# Patient Record
Sex: Male | Born: 1965 | Race: White | Hispanic: No | Smoking: Former smoker
Health system: Southern US, Community
[De-identification: ages and names within clinical notes are randomized; demographics above are authoritative.]

## PROBLEM LIST (undated history)

## (undated) DIAGNOSIS — I10 Essential (primary) hypertension: Secondary | ICD-10-CM

## (undated) DIAGNOSIS — I639 Cerebral infarction, unspecified: Secondary | ICD-10-CM

## (undated) DIAGNOSIS — E785 Hyperlipidemia, unspecified: Secondary | ICD-10-CM

## (undated) HISTORY — PX: HERNIA REPAIR: SHX51

## (undated) HISTORY — DX: Hyperlipidemia, unspecified: E78.5

## (undated) HISTORY — DX: Essential (primary) hypertension: I10

## (undated) HISTORY — PX: SHOULDER SURGERY: SHX246

---

## 1999-02-12 ENCOUNTER — Emergency Department (HOSPITAL_COMMUNITY): Admission: EM | Admit: 1999-02-12 | Discharge: 1999-02-12 | Payer: Self-pay | Admitting: Emergency Medicine

## 1999-02-12 ENCOUNTER — Encounter: Payer: Self-pay | Admitting: Emergency Medicine

## 2002-02-12 ENCOUNTER — Emergency Department (HOSPITAL_COMMUNITY): Admission: EM | Admit: 2002-02-12 | Discharge: 2002-02-12 | Payer: Self-pay | Admitting: Emergency Medicine

## 2002-03-03 ENCOUNTER — Encounter: Payer: Self-pay | Admitting: Emergency Medicine

## 2002-03-03 ENCOUNTER — Inpatient Hospital Stay (HOSPITAL_COMMUNITY): Admission: EM | Admit: 2002-03-03 | Discharge: 2002-03-04 | Payer: Self-pay

## 2015-03-23 ENCOUNTER — Emergency Department (HOSPITAL_COMMUNITY): Payer: Self-pay

## 2015-03-23 ENCOUNTER — Emergency Department (HOSPITAL_COMMUNITY)
Admission: EM | Admit: 2015-03-23 | Discharge: 2015-03-23 | Disposition: A | Discharge status: Home / Self Care | Payer: Self-pay | Attending: Emergency Medicine | Admitting: Emergency Medicine

## 2015-03-23 ENCOUNTER — Encounter (HOSPITAL_COMMUNITY): Payer: Self-pay

## 2015-03-23 DIAGNOSIS — M25512 Pain in left shoulder: Secondary | ICD-10-CM

## 2015-03-23 DIAGNOSIS — Y998 Other external cause status: Secondary | ICD-10-CM | POA: Insufficient documentation

## 2015-03-23 DIAGNOSIS — Y9389 Activity, other specified: Secondary | ICD-10-CM | POA: Insufficient documentation

## 2015-03-23 DIAGNOSIS — Y92410 Unspecified street and highway as the place of occurrence of the external cause: Secondary | ICD-10-CM | POA: Insufficient documentation

## 2015-03-23 DIAGNOSIS — M25562 Pain in left knee: Secondary | ICD-10-CM

## 2015-03-23 DIAGNOSIS — S40812A Abrasion of left upper arm, initial encounter: Secondary | ICD-10-CM | POA: Insufficient documentation

## 2015-03-23 DIAGNOSIS — S299XXA Unspecified injury of thorax, initial encounter: Secondary | ICD-10-CM | POA: Insufficient documentation

## 2015-03-23 DIAGNOSIS — R0781 Pleurodynia: Secondary | ICD-10-CM

## 2015-03-23 DIAGNOSIS — M25572 Pain in left ankle and joints of left foot: Secondary | ICD-10-CM

## 2015-03-23 DIAGNOSIS — S8992XA Unspecified injury of left lower leg, initial encounter: Secondary | ICD-10-CM | POA: Insufficient documentation

## 2015-03-23 DIAGNOSIS — Z23 Encounter for immunization: Secondary | ICD-10-CM | POA: Insufficient documentation

## 2015-03-23 DIAGNOSIS — S99912A Unspecified injury of left ankle, initial encounter: Secondary | ICD-10-CM | POA: Insufficient documentation

## 2015-03-23 DIAGNOSIS — Z720 Tobacco use: Secondary | ICD-10-CM | POA: Insufficient documentation

## 2015-03-23 DIAGNOSIS — S3991XA Unspecified injury of abdomen, initial encounter: Secondary | ICD-10-CM | POA: Insufficient documentation

## 2015-03-23 DIAGNOSIS — S4992XA Unspecified injury of left shoulder and upper arm, initial encounter: Secondary | ICD-10-CM | POA: Insufficient documentation

## 2015-03-23 MED ORDER — METHOCARBAMOL 500 MG PO TABS: 500.0000 mg | ORAL_TABLET | Freq: Two times a day (BID) | ORAL | Status: DC | PRN

## 2015-03-23 MED ORDER — TETANUS-DIPHTH-ACELL PERTUSSIS 5-2.5-18.5 LF-MCG/0.5 IM SUSP
0.5000 mL | Freq: Once | INTRAMUSCULAR | Status: AC
  Administered 2015-03-23: 0.5 mL via INTRAMUSCULAR
  Filled 2015-03-23: qty 0.5

## 2015-03-23 MED ORDER — HYDROCODONE-ACETAMINOPHEN 5-325 MG PO TABS
1.0000 | ORAL_TABLET | Freq: Once | ORAL | Status: AC
  Administered 2015-03-23: 1 via ORAL
  Filled 2015-03-23: qty 1

## 2015-03-23 MED ORDER — NAPROXEN 250 MG PO TABS: 250.0000 mg | ORAL_TABLET | Freq: Two times a day (BID) | ORAL | Status: DC
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
## 2015-03-23 NOTE — ED Notes (Signed)
Will, PA at the bedside.  
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
## 2018-02-16 ENCOUNTER — Other Ambulatory Visit: Payer: Self-pay

## 2018-02-16 ENCOUNTER — Ambulatory Visit (INDEPENDENT_AMBULATORY_CARE_PROVIDER_SITE_OTHER): Payer: BLUE CROSS/BLUE SHIELD | Admitting: Urgent Care

## 2018-02-16 ENCOUNTER — Encounter: Payer: Self-pay | Admitting: Urgent Care

## 2018-02-16 ENCOUNTER — Ambulatory Visit (INDEPENDENT_AMBULATORY_CARE_PROVIDER_SITE_OTHER): Payer: BLUE CROSS/BLUE SHIELD

## 2018-02-16 VITALS — BP 128/90 | HR 62 | Temp 98.0°F | Resp 16 | Ht 74.0 in | Wt 181.6 lb

## 2018-02-16 DIAGNOSIS — Z130 Encounter for screening for diseases of the blood and blood-forming organs and certain disorders involving the immune mechanism: Secondary | ICD-10-CM

## 2018-02-16 DIAGNOSIS — M25561 Pain in right knee: Secondary | ICD-10-CM

## 2018-02-16 DIAGNOSIS — Z1329 Encounter for screening for other suspected endocrine disorder: Secondary | ICD-10-CM

## 2018-02-16 DIAGNOSIS — M25562 Pain in left knee: Secondary | ICD-10-CM

## 2018-02-16 DIAGNOSIS — Z720 Tobacco use: Secondary | ICD-10-CM

## 2018-02-16 DIAGNOSIS — G8929 Other chronic pain: Secondary | ICD-10-CM

## 2018-02-16 DIAGNOSIS — H6123 Impacted cerumen, bilateral: Secondary | ICD-10-CM | POA: Diagnosis not present

## 2018-02-16 DIAGNOSIS — F17210 Nicotine dependence, cigarettes, uncomplicated: Secondary | ICD-10-CM | POA: Diagnosis not present

## 2018-02-16 DIAGNOSIS — Z114 Encounter for screening for human immunodeficiency virus [HIV]: Secondary | ICD-10-CM

## 2018-02-16 DIAGNOSIS — Z1211 Encounter for screening for malignant neoplasm of colon: Secondary | ICD-10-CM | POA: Diagnosis not present

## 2018-02-16 DIAGNOSIS — Z0000 Encounter for general adult medical examination without abnormal findings: Secondary | ICD-10-CM

## 2018-02-16 DIAGNOSIS — Z13228 Encounter for screening for other metabolic disorders: Secondary | ICD-10-CM

## 2018-02-16 DIAGNOSIS — Z1321 Encounter for screening for nutritional disorder: Secondary | ICD-10-CM

## 2018-02-16 DIAGNOSIS — Z0001 Encounter for general adult medical examination with abnormal findings: Secondary | ICD-10-CM

## 2018-02-16 MED ORDER — MELOXICAM 15 MG PO TABS
7.5000 mg | ORAL_TABLET | Freq: Every day | ORAL | 2 refills | Status: DC
Start: 2018-02-16 — End: 2021-05-20

## 2018-02-16 NOTE — Progress Notes (Signed)
MRN: [id number]  Subjective:   [name] is a 52 y.o. male presenting for annual physical exam. Patient is divorced, does not have children. 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 Smokes 1ppd. Has ~2 drinks of alcohol per week.   Medical care team includes: PCP: Patient, No Pcp Per Vision: Wears glasses for reading. Has not had an eye exam.  Dental: Does not get dental care.  Specialists: None.  Health Maintenance: Needs to have colon cancer screening, as opposed to doing a colonoscopy but is agreeable to using Cologuard.  Patient's Tdap is up-to-date.  Will screen for HIV today.  Office visit - Reports long-standing history of bilateral knee pain.  Patient uses BC powders every day for this.  He currently denies active knee pain.  Reports that this is related to the nature of his work, listed above.  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  Reports that he has never had his knees evaluated.  Review of systems as below.  He does admit that he has had bruising which she suspects is related to his chronic BC powder use.  Todd [name]  He has No Known Allergies. Todd [name] denies past medical history. He  has a past surgical history that includes Shoulder surgery and Hernia repair. His family history includes Cancer in his father and mother. Mother had brain cancer, father had pancreatic and liver cancer.   Review of Systems  Constitutional: Negative for chills, diaphoresis, fever, malaise/fatigue and weight loss.  HENT: Negative for congestion, ear discharge, ear pain, hearing loss, nosebleeds, sore throat and tinnitus.   Eyes: Negative for blurred vision, double vision, photophobia, pain, discharge and redness.  Respiratory: Negative for cough, shortness of breath and wheezing.   Cardiovascular: Negative for chest pain, palpitations and leg swelling.  Gastrointestinal: Negative for abdominal pain, blood in stool,  constipation, diarrhea, nausea and vomiting.  Genitourinary: Negative for dysuria, flank pain, frequency, hematuria and urgency.  Musculoskeletal: Positive for joint pain (chronic knee pain). Negative for back pain and myalgias.  Skin: Negative for itching and rash.  Neurological: Negative for dizziness, tingling, seizures, loss of consciousness, weakness and headaches.  Endo/Heme/Allergies: Negative for polydipsia. Bruises/bleeds easily.  Psychiatric/Behavioral: Negative for depression, hallucinations, memory loss, substance abuse and suicidal ideas. The patient is not nervous/anxious and does not have insomnia.    Objective:   Vitals: BP 128/90   Pulse 62   Temp 98 F (36.7 C)   Resp 16   Ht 6\' 2"  (1.88 m)   Wt 181 lb 9.6 oz (82.4 kg)   SpO2 98%   BMI 23.32 kg/m   Physical Exam  Constitutional: He is oriented to person, place, and time. He appears well-developed and well-nourished.  HENT:  TM's cerumen occluded bilaterally. Nasal turbinates pink and moist, nasal passages patent. No sinus tenderness. Oropharynx clear, mucous membranes moist, evidence of caries and mild gingival erythema.  Eyes: Pupils are equal, round, and reactive to light. Conjunctivae and EOM are normal. Right eye exhibits no discharge. Left eye exhibits no discharge. No scleral icterus.  Neck: Normal range of motion. Neck supple. No thyromegaly present.  Cardiovascular: Normal rate, regular rhythm and intact distal pulses. Exam reveals no gallop and no friction rub.  No murmur heard. Pulmonary/Chest: No stridor. No respiratory distress. He has no wheezes. He has no rales.  Abdominal: Soft. Bowel sounds are normal. He exhibits no distension and no mass. There is  no tenderness.  Musculoskeletal: Normal range of motion. He exhibits no edema or tenderness.  Lymphadenopathy:    He has no cervical adenopathy.  Neurological: He is alert and oriented to person, place, and time. He has normal reflexes.  Skin: Skin is  warm and dry. No rash noted. No erythema. No pallor.  Psychiatric: He has a normal mood and affect.   Dg Knee Complete 4 Views Left  Result Date: 02/16/2018 CLINICAL DATA:  Left knee pain, no known injury, initial encounter EXAM: LEFT KNEE - COMPLETE 4+ VIEW COMPARISON:  03/23/2015 FINDINGS: No evidence of fracture, dislocation, or joint effusion. No evidence of arthropathy or other focal bone abnormality. Soft tissues are unremarkable. IMPRESSION: No acute abnormality seen. Electronically Signed   By: Alcide Clever M.D.   On: 02/16/2018 10:45   Dg Knee Complete 4 Views Right  Result Date: 02/16/2018 CLINICAL DATA:  Chronic bilateral knee pain. EXAM: RIGHT KNEE - COMPLETE 4+ VIEW COMPARISON:  Left knee 02/16/2018 FINDINGS: Right knee is located without a fracture or joint effusion. Mild osteophytosis along the medial knee compartment but no significant joint space narrowing. IMPRESSION: Mild osteoarthritis in the right knee.  No acute abnormality. Electronically Signed   By: Richarda Overlie M.D.   On: 02/16/2018 10:03   Assessment and Plan :   Annual physical exam  Screening for endocrine, nutritional, metabolic and immunity disorder - Plan: Comprehensive metabolic panel, Lipid panel, TSH  Screening for deficiency anemia - Plan: CBC  Screen for colon cancer - Plan: Cologuard  Screening for HIV without presence of risk factors - Plan: HIV antibody  Chronic pain of both knees - Plan: DG Knee Complete 4 Views Right, DG Knee Complete 4 Views Left  Bilateral impacted cerumen - Plan: Ear wax removal  CPE - Patient is medically stable, labs pending. Discussed healthy lifestyle, diet, exercise, preventative care, vaccinations, and addressed patient's concerns. Ear lavage performed today.  Patient is not ready to quit smoking.  Cologuard testing pending.  Knee pain - Stop daily use of BC powders.  Start meloxicam.  Will report to patient by letter that he has mild arthritis of his right knee but not  the left.  We will follow-up and continue to monitor his chronic knee pain.  Wallis Bamberg, PA-C [facility] [phone] 02/16/2018  9:07 AM

## 2018-02-16 NOTE — Patient Instructions (Addendum)
Health Maintenance, Male A healthy lifestyle and preventive care is important for your health and wellness. Ask your health care provider about what schedule of regular examinations is right for you. What should I know about weight and diet? Eat a Healthy Diet  Eat plenty of vegetables, fruits, whole grains, low-fat dairy products, and lean protein.  Do not eat a lot of foods high in solid fats, added sugars, or salt.  Maintain a Healthy Weight Regular exercise can help you achieve or maintain a healthy weight. You should:  Do at least 150 minutes of exercise each week. The exercise should increase your heart rate and make you sweat (moderate-intensity exercise).  Do strength-training exercises at least twice a week.  Watch Your Levels of Cholesterol and Blood Lipids  Have your blood tested for lipids and cholesterol every 5 years starting at 53 years of age. If you are at high risk for heart disease, you should start having your blood tested when you are 53 years old. You may need to have your cholesterol levels checked more often if: ? Your lipid or cholesterol levels are high. ? You are older than 53 years of age. ? You are at high risk for heart disease.  What should I know about cancer screening? Many types of cancers can be detected early and may often be prevented. Lung Cancer  You should be screened every year for lung cancer if: ? You are a current smoker who has smoked for at least 30 years. ? You are a former smoker who has quit within the past 15 years.  Talk to your health care provider about your screening options, when you should start screening, and how often you should be screened.  Colorectal Cancer  Routine colorectal cancer screening usually begins at 53 years of age and should be repeated every 5-10 years until you are 53 years old. You may need to be screened more often if early forms of precancerous polyps or small growths are found. Your health care provider  may recommend screening at an earlier age if you have risk factors for colon cancer.  Your health care provider may recommend using home test kits to check for hidden blood in the stool.  A small camera at the end of a tube can be used to examine your colon (sigmoidoscopy or colonoscopy). This checks for the earliest forms of colorectal cancer.  Prostate and Testicular Cancer  Depending on your age and overall health, your health care provider may do certain tests to screen for prostate and testicular cancer.  Talk to your health care provider about any symptoms or concerns you have about testicular or prostate cancer.  Skin Cancer  Check your skin from head to toe regularly.  Tell your health care provider about any new moles or changes in moles, especially if: ? There is a change in a mole's size, shape, or color. ? You have a mole that is larger than a pencil eraser.  Always use sunscreen. Apply sunscreen liberally and repeat throughout the day.  Protect yourself by wearing long sleeves, pants, a wide-brimmed hat, and sunglasses when outside.  What should I know about heart disease, diabetes, and high blood pressure?  If you are 18-39 years of age, have your blood pressure checked every 3-5 years. If you are 40 years of age or older, have your blood pressure checked every year. You should have your blood pressure measured twice-once when you are at a hospital or clinic, and once when   you are not at a hospital or clinic. Record the average of the two measurements. To check your blood pressure when you are not at a hospital or clinic, you can use: ? An automated blood pressure machine at a pharmacy. ? A home blood pressure monitor.  Talk to your health care provider about your target blood pressure.  If you are between 45-79 years old, ask your health care provider if you should take aspirin to prevent heart disease.  Have regular diabetes screenings by checking your fasting blood  sugar level. ? If you are at a normal weight and have a low risk for diabetes, have this test once every three years after the age of 45. ? If you are overweight and have a high risk for diabetes, consider being tested at a younger age or more often.  A one-time screening for abdominal aortic aneurysm (AAA) by ultrasound is recommended for men aged 65-75 years who are current or former smokers. What should I know about preventing infection? Hepatitis B If you have a higher risk for hepatitis B, you should be screened for this virus. Talk with your health care provider to find out if you are at risk for hepatitis B infection. Hepatitis C Blood testing is recommended for:  Everyone born from 1945 through 1965.  Anyone with known risk factors for hepatitis C.  Sexually Transmitted Diseases (STDs)  You should be screened each year for STDs including gonorrhea and chlamydia if: ? You are sexually active and are younger than 53 years of age. ? You are older than 53 years of age and your health care provider tells you that you are at risk for this type of infection. ? Your sexual activity has changed since you were last screened and you are at an increased risk for chlamydia or gonorrhea. Ask your health care provider if you are at risk.  Talk with your health care provider about whether you are at high risk of being infected with HIV. Your health care provider may recommend a prescription medicine to help prevent HIV infection.  What else can I do?  Schedule regular health, dental, and eye exams.  Stay current with your vaccines (immunizations).  Do not use any tobacco products, such as cigarettes, chewing tobacco, and e-cigarettes. If you need help quitting, ask your health care provider.  Limit alcohol intake to no more than 2 drinks per day. One drink equals 12 ounces of beer, 5 ounces of wine, or 1 ounces of hard liquor.  Do not use street drugs.  Do not share needles.  Ask your  health care provider for help if you need support or information about quitting drugs.  Tell your health care provider if you often feel depressed.  Tell your health care provider if you have ever been abused or do not feel safe at home. This information is not intended to replace advice given to you by your health care provider. Make sure you discuss any questions you have with your health care provider. Document Released: 01/08/2008 Document Revised: 03/10/2016 Document Reviewed: 04/15/2015 Elsevier Interactive Patient Education  2018 [employer]     IF you received an x-ray today, you will receive an invoice from [facility]. Please contact [facility] at [phone] with questions or concerns regarding your invoice.   IF you received labwork today, you will receive an invoice from [facility]. Please contact [facility] at [phone] with questions or concerns regarding your invoice.   Our billing staff will not be   able to assist you with questions regarding bills from these companies.  You will be contacted with the lab results as soon as they are available. The fastest way to get your results is to activate your My Chart account. Instructions are located on the last page of this paperwork. If you have not heard from us regarding the results in 2 weeks, please contact this office.       

## 2018-02-17 LAB — CBC
HEMATOCRIT: 43.3 % (ref 37.5–51.0)
HEMOGLOBIN: 14.4 g/dL (ref 13.0–17.7)
MCH: 31.9 pg (ref 26.6–33.0)
MCHC: 33.3 g/dL (ref 31.5–35.7)
MCV: 96 fL (ref 79–97)
Platelets: 171 10*3/uL (ref 150–450)
RBC: 4.51 x10E6/uL (ref 4.14–5.80)
RDW: 13.3 % (ref 12.3–15.4)
WBC: 6.3 10*3/uL (ref 3.4–10.8)

## 2018-02-17 LAB — COMPREHENSIVE METABOLIC PANEL
A/G RATIO: 1.9 (ref 1.2–2.2)
ALBUMIN: 5.1 g/dL (ref 3.5–5.5)
ALK PHOS: 59 IU/L (ref 39–117)
ALT: 18 IU/L (ref 0–44)
AST: 21 IU/L (ref 0–40)
BUN/Creatinine Ratio: 17 (ref 9–20)
BUN: 15 mg/dL (ref 6–24)
Bilirubin Total: 0.2 mg/dL (ref 0.0–1.2)
CALCIUM: 10 mg/dL (ref 8.7–10.2)
CO2: 18 mmol/L — AB (ref 20–29)
CREATININE: 0.9 mg/dL (ref 0.76–1.27)
Chloride: 101 mmol/L (ref 96–106)
GFR calc Af Amer: 112 mL/min/{1.73_m2} (ref >59)
GFR, EST NON AFRICAN AMERICAN: 97 mL/min/{1.73_m2} (ref >59)
Globulin, Total: 2.7 g/dL (ref 1.5–4.5)
Glucose: 88 mg/dL (ref 65–99)
POTASSIUM: 4.3 mmol/L (ref 3.5–5.2)
SODIUM: 140 mmol/L (ref 134–144)
Total Protein: 7.8 g/dL (ref 6.0–8.5)

## 2018-02-17 LAB — LIPID PANEL
CHOL/HDL RATIO: 4.4 ratio (ref 0.0–5.0)
Cholesterol, Total: 272 mg/dL — ABNORMAL HIGH (ref 100–199)
HDL: 62 mg/dL (ref >39)
LDL Calculated: 196 mg/dL — ABNORMAL HIGH (ref 0–99)
TRIGLYCERIDES: 69 mg/dL (ref 0–149)
VLDL Cholesterol Cal: 14 mg/dL (ref 5–40)

## 2018-02-17 LAB — HIV ANTIBODY (ROUTINE TESTING W REFLEX): HIV Screen 4th Generation wRfx: NONREACTIVE

## 2018-02-17 LAB — TSH: TSH: 1.15 u[IU]/mL (ref 0.450–4.500)

## 2018-02-21 ENCOUNTER — Encounter: Payer: Self-pay | Admitting: Urgent Care

## 2020-01-06 IMAGING — DX DG KNEE COMPLETE 4+V*R*
4 series · 4 of 4 positions shown · non-contrast
Comparison: Left knee 02/16/2018

CLINICAL DATA: Chronic bilateral knee pain.

EXAM:
RIGHT KNEE - COMPLETE 4+ VIEW

[knee ap]
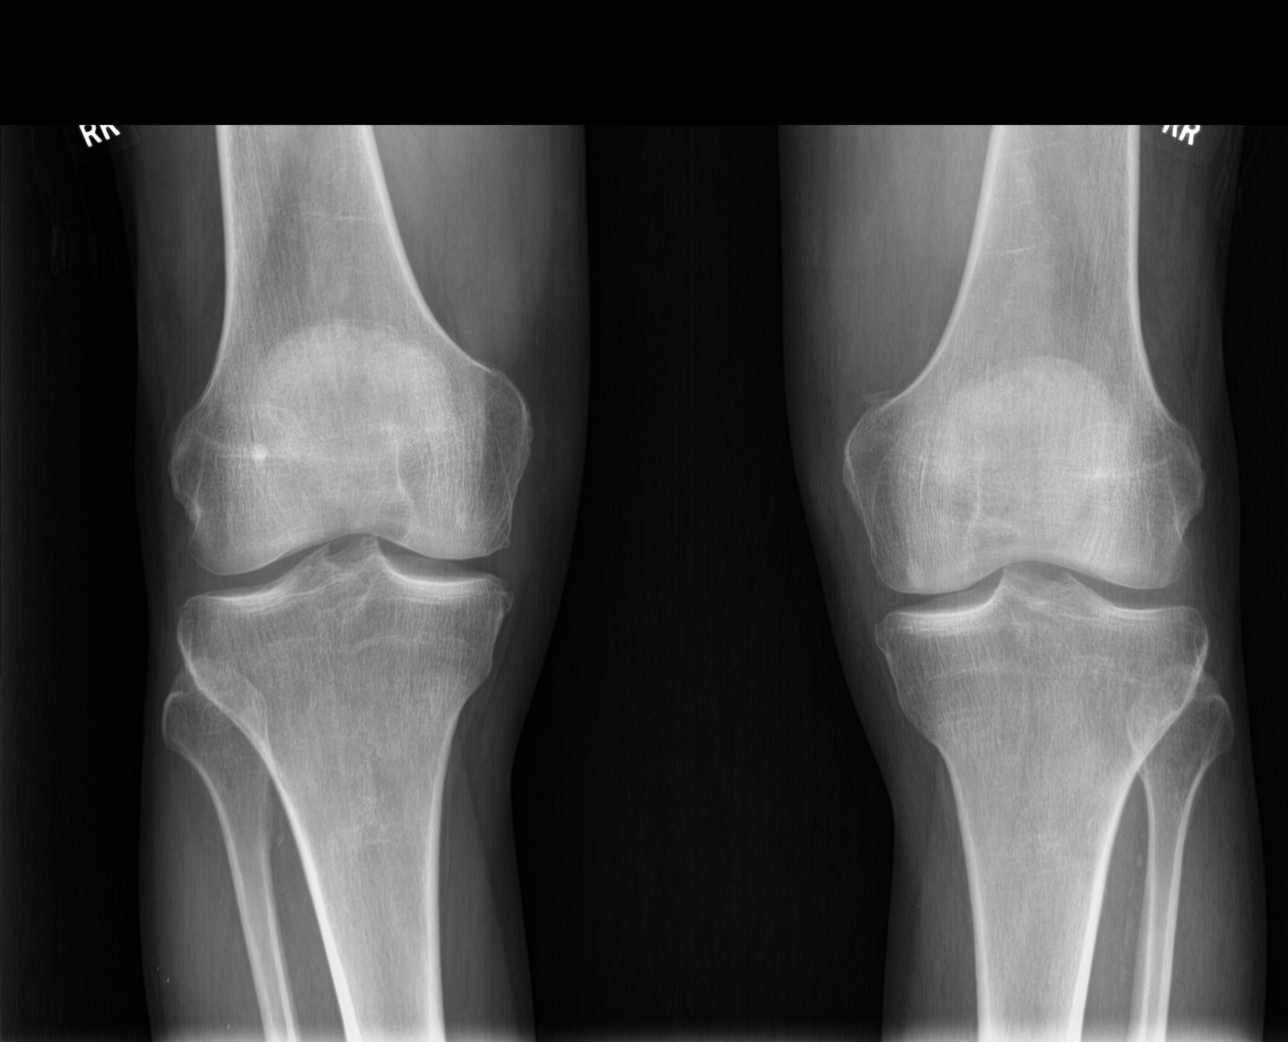

[knee obl (1 of 2)]
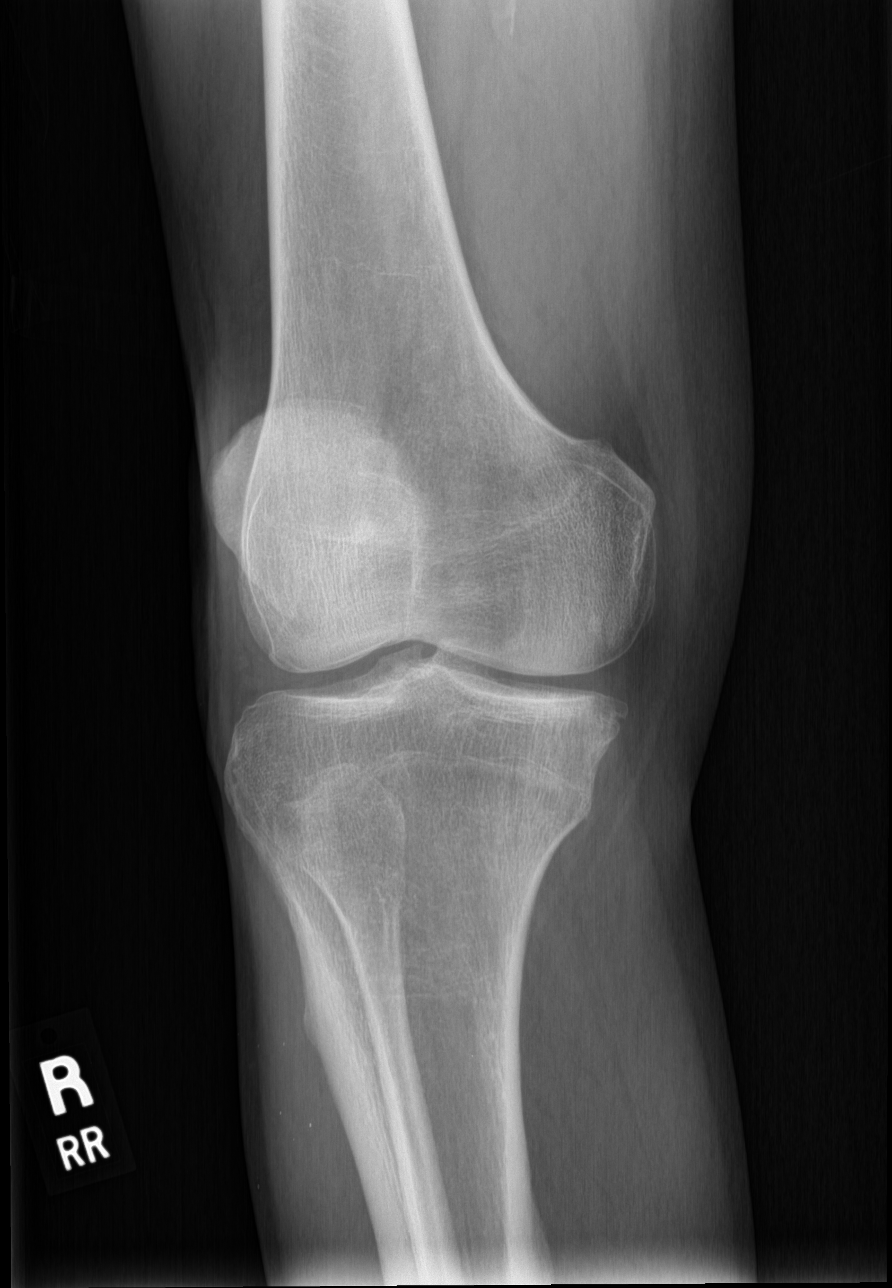

[knee obl (2 of 2)]
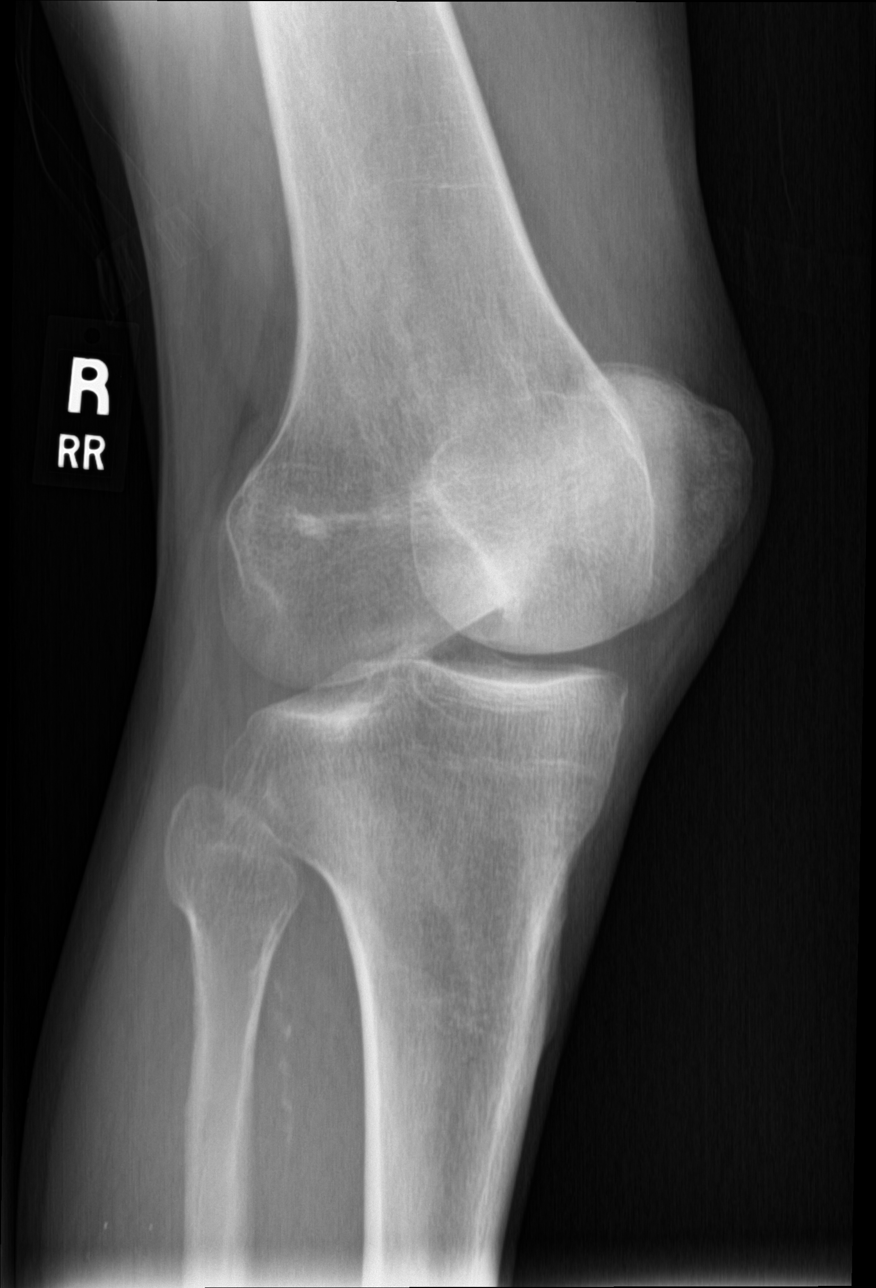

[knee lat]
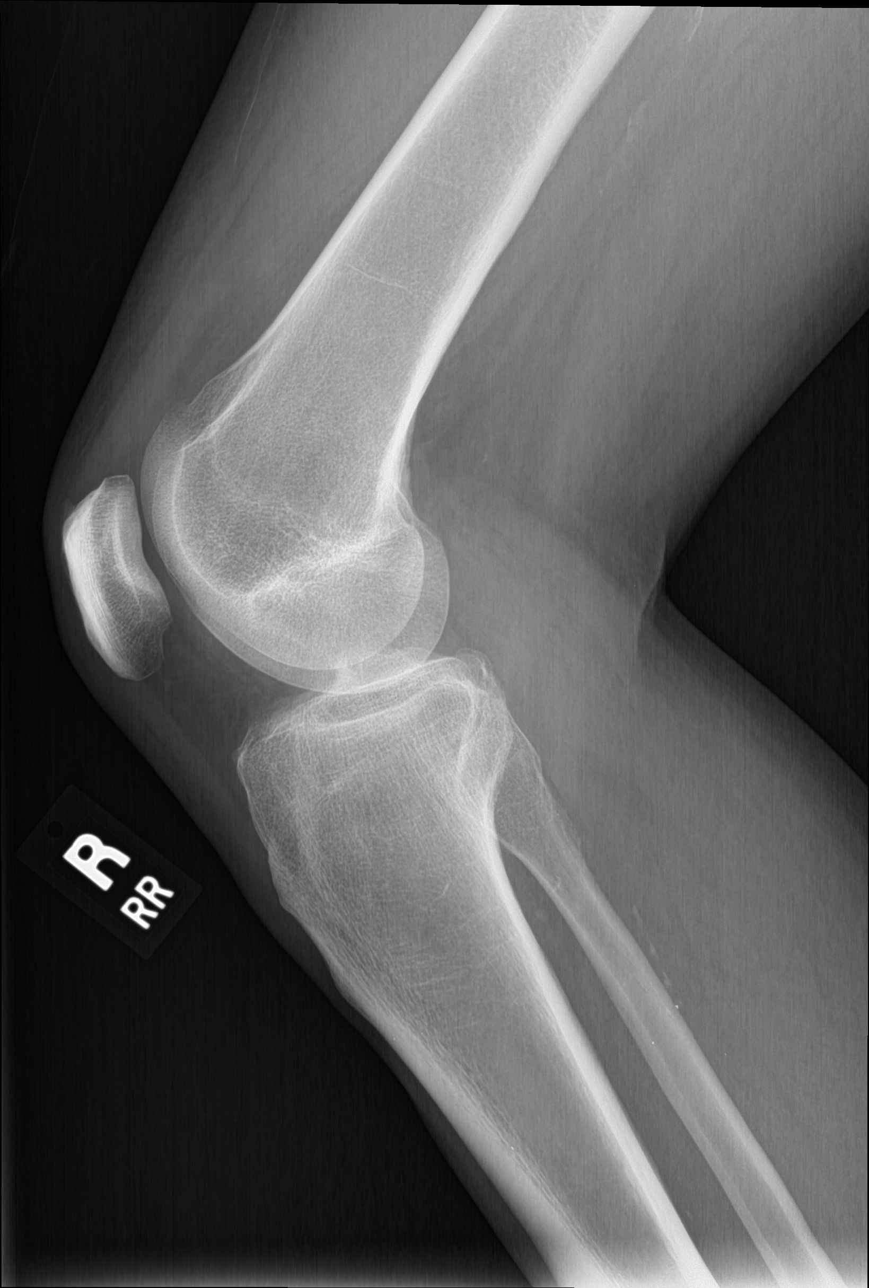

[4 of 4 positions shown; findings below may reference images not displayed]

FINDINGS: Right knee is located without a fracture or joint effusion. Mild
osteophytosis along the medial knee compartment but no significant
joint space narrowing.
IMPRESSION: Mild osteoarthritis in the right knee.  No acute abnormality.

## 2021-05-07 ENCOUNTER — Other Ambulatory Visit: Payer: Self-pay

## 2021-05-07 ENCOUNTER — Ambulatory Visit (INDEPENDENT_AMBULATORY_CARE_PROVIDER_SITE_OTHER): Payer: BLUE CROSS/BLUE SHIELD | Admitting: Nurse Practitioner

## 2021-05-07 ENCOUNTER — Encounter: Payer: Self-pay | Admitting: Nurse Practitioner

## 2021-05-07 VITALS — BP 156/89 | HR 100 | Resp 18

## 2021-05-07 DIAGNOSIS — I10 Essential (primary) hypertension: Secondary | ICD-10-CM | POA: Insufficient documentation

## 2021-05-07 MED ORDER — LISINOPRIL 10 MG PO TABS: 10.0000 mg | ORAL_TABLET | Freq: Every day | ORAL | 3 refills | Status: DC

## 2021-05-07 NOTE — Patient Instructions (Addendum)
Hypertension:  Low sodium diet  Stay well hydrated  Stay active  Will start lisinopril 10 mg daily  Will check labs  Follow up:  Follow up in 3 weeks to establish care and BP recheck    Blood Pressure Record Sheet To take your blood pressure, you will need a blood pressure machine. You can buy a blood pressure machine (blood pressure monitor) at your clinic, drug store, or online. When choosing one, consider: An automatic monitor that has an arm cuff. A cuff that wraps snugly around your upper arm. You should be able to fit only one finger between your arm and the cuff. A device that stores blood pressure reading results. Do not choose a monitor that measures your blood pressure from your wrist or finger. Follow your health care provider's instructions for how to take your blood pressure. To use this form: Get one reading in the morning (a.m.) before you take any medicines. Get one reading in the evening (p.m.) before supper. Take at least 2 readings with each blood pressure check. This makes sure the results are correct. Wait 1-2 minutes between measurements. Write down the results in the spaces on this form. Repeat this once a week, or as told by your health care provider. Make a follow-up appointment with your health care provider to discuss the results. Blood pressure log Date: _______________________ a.m. _____________________(1st reading) _____________________(2nd reading) p.m. _____________________(1st reading) _____________________(2nd reading) Date: _______________________ a.m. _____________________(1st reading) _____________________(2nd reading) p.m. _____________________(1st reading) _____________________(2nd reading) Date: _______________________ a.m. _____________________(1st reading) _____________________(2nd reading) p.m. _____________________(1st reading) _____________________(2nd reading) Date: _______________________ a.m. _____________________(1st reading)  _____________________(2nd reading) p.m. _____________________(1st reading) _____________________(2nd reading) Date: _______________________ a.m. _____________________(1st reading) _____________________(2nd reading) p.m. _____________________(1st reading) _____________________(2nd reading) This information is not intended to replace advice given to you by your health care provider. Make sure you discuss any questions you have with your health care provider. Document Revised: 10/31/2019 Document Reviewed: 10/31/2019 Elsevier Patient Education  2022 Else[employer] Inc.   How to Take Your Blood Pressure Blood pressure measures how strongly your blood is pressing against the walls of your arteries. Arteries are blood vessels that carry blood from your heart throughout your body. You can take your blood pressure at home with a machine. You may need to check your blood pressure at home: To check if you have high blood pressure (hypertension). To check your blood pressure over time. To make sure your blood pressure medicine is working. Supplies needed: Blood pressure machine, or monitor. Dining room chair to sit in. Table or desk. Small notebook. Pencil or pen. How to prepare Avoid these things for 30 minutes before checking your blood pressure: Having drinks with caffeine in them, such as coffee or tea. Drinking alcohol. Eating. Smoking. Exercising. Do these things five minutes before checking your blood pressure: Go to the bathroom and pee (urinate). Sit in a dining chair. Do not sit on a soft couch or an armchair. Be quiet. Do not talk. How to take your blood pressure Follow the instructions that came with your machine. If you have a digital blood pressure monitor, these may be the instructions: Sit up straight. Place your feet on the floor. Do not cross your ankles or legs. Rest your left arm at the level of your heart. You may rest it on a table, desk, or chair. Pull up your shirt  sleeve. Wrap the blood pressure cuff around the upper part of your left arm. The cuff should be 1 inch (2.5 cm) above your elbow. It is  best to wrap the cuff around bare skin. Fit the cuff snugly around your arm. You should be able to place only one finger between the cuff and your arm. Place the cord so that it rests in the bend of your elbow. Press the power button. Sit quietly while the cuff fills with air and loses air. Write down the numbers on the screen. Wait 2-3 minutes and then repeat steps 1-10. What do the numbers mean? Two numbers make up your blood pressure. The first number is called systolic pressure. The second is called diastolic pressure. An example of a blood pressure reading is "120 over 80" (or 120/80). If you are an adult and do not have a medical condition, use this guide to find out if your blood pressure is normal: Normal First number: below 120. Second number: below 80. Elevated First number: 120-129. Second number: below 80. Hypertension stage 1 First number: 130-139. Second number: 80-89. Hypertension stage 2 First number: 140 or above. Second number: 90 or above. Your blood pressure is above normal even if only the first or only the second number is above normal. Follow these instructions at home: Medicines Take over-the-counter and prescription medicines only as told by your doctor. Tell your doctor if your medicine is causing side effects. General instructions Check your blood pressure as often as your doctor tells you to. Check your blood pressure at the same time every day. Take your monitor to your next doctor's appointment. Your doctor will: Make sure you are using it correctly. Make sure it is working right. Understand what your blood pressure numbers should be. Keep all follow-up visits as told by your doctor. This is important. General tips You will need a blood pressure machine, or monitor. Your doctor can suggest a monitor. You can buy one  at a drugstore or online. When choosing one: Choose one with an arm cuff. Choose one that wraps around your upper arm. Only one finger should fit between your arm and the cuff. Do not choose one that measures your blood pressure from your wrist or finger. Where to find more information American Heart Association: www.heart.org Contact a doctor if: Your blood pressure keeps being high. Your blood pressure is suddenly low. Get help right away if: Your first blood pressure number is higher than 180. Your second blood pressure number is higher than 120. Summary Check your blood pressure at the same time every day. Avoid caffeine, alcohol, smoking, and exercise for 30 minutes before checking your blood pressure. Make sure you understand what your blood pressure numbers should be. This information is not intended to replace advice given to you by your health care provider. Make sure you discuss any questions you have with your health care provider. Document Revised: 05/21/2020 Document Reviewed: 07/06/2019 Elsevier Patient Education  2022 Elsevier Inc.   Managing Your Hypertension Hypertension, also called high blood pressure, is when the force of the blood pressing against the walls of the arteries is too strong. Arteries are blood vessels that carry blood from your heart throughout your body. Hypertension forces the heart to work harder to pump blood and may cause the arteries to become narrow or stiff. Understanding blood pressure readings Your personal target blood pressure may vary depending on your medical conditions, your age, and other factors. A blood pressure reading includes a higher number over a lower number. Ideally, your blood pressure should be below 120/80. You should know that: The first, or top, number is called the systolic pressure. It  is a measure of the pressure in your arteries as your heart beats. The second, or bottom number, is called the diastolic pressure. It is a  measure of the pressure in your arteries as the heart relaxes. Blood pressure is classified into four stages. Based on your blood pressure reading, your health care provider may use the following stages to determine what type of treatment you need, if any. Systolic pressure and diastolic pressure are measured in a unit called mmHg. Normal Systolic pressure: below 120. Diastolic pressure: below 80. Elevated Systolic pressure: 120-129. Diastolic pressure: below 80. Hypertension stage 1 Systolic pressure: 130-139. Diastolic pressure: 80-89. Hypertension stage 2 Systolic pressure: 140 or above. Diastolic pressure: 90 or above. How can this condition affect me? Managing your hypertension is an important responsibility. Over time, hypertension can damage the arteries and decrease blood flow to important parts of the body, including the brain, heart, and kidneys. Having untreated or uncontrolled hypertension can lead to: A heart attack. A stroke. A weakened blood vessel (aneurysm). Heart failure. Kidney damage. Eye damage. Metabolic syndrome. Memory and concentration problems. Vascular dementia. What actions can I take to manage this condition? Hypertension can be managed by making lifestyle changes and possibly by taking medicines. Your health care provider will help you make a plan to bring your blood pressure within a normal range. Nutrition  Eat a diet that is high in fiber and potassium, and low in salt (sodium), added sugar, and fat. An example eating plan is called the Dietary Approaches to Stop Hypertension (DASH) diet. To eat this way: Eat plenty of fresh fruits and vegetables. Try to fill one-half of your plate at each meal with fruits and vegetables. Eat whole grains, such as whole-wheat pasta, brown rice, or whole-grain bread. Fill about one-fourth of your plate with whole grains. Eat low-fat dairy products. Avoid fatty cuts of meat, processed or cured meats, and poultry with  skin. Fill about one-fourth of your plate with lean proteins such as fish, chicken without skin, beans, eggs, and tofu. Avoid pre-made and processed foods. These tend to be higher in sodium, added sugar, and fat. Reduce your daily sodium intake. Most people with hypertension should eat less than 1,500 mg of sodium a day. Lifestyle  Work with your health care provider to maintain a healthy body weight or to lose weight. Ask what an ideal weight is for you. Get at least 30 minutes of exercise that causes your heart to beat faster (aerobic exercise) most days of the week. Activities may include walking, swimming, or biking. Include exercise to strengthen your muscles (resistance exercise), such as weight lifting, as part of your weekly exercise routine. Try to do these types of exercises for 30 minutes at least 3 days a week. Do not use any products that contain nicotine or tobacco, such as cigarettes, e-cigarettes, and chewing tobacco. If you need help quitting, ask your health care provider. Control any long-term (chronic) conditions you have, such as high cholesterol or diabetes. Identify your sources of stress and find ways to manage stress. This may include meditation, deep breathing, or making time for fun activities. Alcohol use Do not drink alcohol if: Your health care provider tells you not to drink. You are pregnant, may be pregnant, or are planning to become pregnant. If you drink alcohol: Limit how much you use to: 0-1 drink a day for women. 0-2 drinks a day for men. Be aware of how much alcohol is in your drink. In the [place] one drink  equals one 12 oz bottle of beer (355 mL), one 5 oz glass of wine (148 mL), or one 1 oz glass of hard liquor (44 mL). Medicines Your health care provider may prescribe medicine if lifestyle changes are not enough to get your blood pressure under control and if: Your systolic blood pressure is 130 or higher. Your diastolic blood pressure is 80 or  higher. Take medicines only as told by your health care provider. Follow the directions carefully. Blood pressure medicines must be taken as told by your health care provider. The medicine does not work as well when you skip doses. Skipping doses also puts you at risk for problems. Monitoring Before you monitor your blood pressure: Do not smoke, drink caffeinated beverages, or exercise within 30 minutes before taking a measurement. Use the bathroom and empty your bladder (urinate). Sit quietly for at least 5 minutes before taking measurements. Monitor your blood pressure at home as told by your health care provider. To do this: Sit with your back straight and supported. Place your feet flat on the floor. Do not cross your legs. Support your arm on a flat surface, such as a table. Make sure your upper arm is at heart level. Each time you measure, take two or three readings one minute apart and record the results. You may also need to have your blood pressure checked regularly by your health care provider. General information Talk with your health care provider about your diet, exercise habits, and other lifestyle factors that may be contributing to hypertension. Review all the medicines you take with your health care provider because there may be side effects or interactions. Keep all visits as told by your health care provider. Your health care provider can help you create and adjust your plan for managing your high blood pressure. Where to find more information National Heart, Lung, and Blood Institute: [employer]is American Heart Association: www.heart.org Contact a health care provider if: You think you are having a reaction to medicines you have taken. You have repeated (recurrent) headaches. You feel dizzy. You have swelling in your ankles. You have trouble with your vision. Get help right away if: You develop a severe headache or confusion. You have unusual weakness or  numbness, or you feel faint. You have severe pain in your chest or abdomen. You vomit repeatedly. You have trouble breathing. These symptoms may represent a serious problem that is an emergency. Do not wait to see if the symptoms will go away. Get medical help right away. Call your local emergency services (911 in the [place] Do not drive yourself to the hospital. Summary Hypertension is when the force of blood pumping through your arteries is too strong. If this condition is not controlled, it may put you at risk for serious complications. Your personal target blood pressure may vary depending on your medical conditions, your age, and other factors. For most people, a normal blood pressure is less than 120/80. Hypertension is managed by lifestyle changes, medicines, or both. Lifestyle changes to help manage hypertension include losing weight, eating a healthy, low-sodium diet, exercising more, stopping smoking, and limiting alcohol. This information is not intended to replace advice given to you by your health care provider. Make sure you discuss any questions you have with your health care provider. Document Revised: 08/17/2019 Document Reviewed: 06/12/2019 Elsevier Patient Education  2022 [employer].

## 2021-05-07 NOTE — Progress Notes (Signed)
 @  Patient ID: Todd [name], male    DOB: 1966-04-10, 55 y.o.   MRN: [id number]  Chief Complaint  Patient presents with   Hypertension    Referring provider: No ref. provider found  HPI  Patient presents today for elevated blood pressure. 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 His blood pressure is elevated in office today. He does not check blood pressures at home. He is a current every day smoker and also drinks alcoholic beverages frequently. He states that he will try to stop smoking and drinking. Patient does not currently have a PCP and would like to establish care with our office. Denies f/c/s, n/v/d, hemoptysis, PND, edema.    No Known Allergies  Immunization History  Administered Date(s) Administered   Tdap 03/23/2015    No past medical history on file.  Tobacco History: Social History   Tobacco Use  Smoking Status Every Day   Packs/day: 0.33   Types: Cigarettes  Smokeless Tobacco Never   Ready to quit: No Counseling given: Yes   Outpatient Encounter Medications as of 05/07/2021  Medication Sig   lisinopril (ZESTRIL) 10 MG tablet Take 1 tablet (10 mg total) by mouth daily.   meloxicam (MOBIC) 15 MG tablet Take 0.5-1 tablets (7.5-15 mg total) by mouth daily.   No facility-administered encounter medications on file as of 05/07/2021.     Review of Systems  Review of Systems  Constitutional: Negative.   HENT: Negative.    Cardiovascular: Negative.   Gastrointestinal: Negative.   Allergic/Immunologic: Negative.   Neurological: Negative.   Psychiatric/Behavioral: Negative.        Physical Exam  BP (!) 156/89   Pulse 100   Resp 18   SpO2 96%   Wt Readings from Last 5 Encounters:  02/16/18 181 lb 9.6 oz (82.4 kg)  03/23/15 200 lb (90.7 kg)     Physical Exam Vitals and nursing note reviewed.  Constitutional:      General: He is not in  acute distress.    Appearance: He is well-developed.  Cardiovascular:     Rate and Rhythm: Normal rate and regular rhythm.  Pulmonary:     Effort: Pulmonary effort is normal.     Breath sounds: Normal breath sounds.  Skin:    General: Skin is warm and dry.  Neurological:     Mental Status: He is alert and oriented to person, place, and time.      Assessment & Plan:   Essential hypertension Hypertension:  Low sodium diet  Stay well hydrated  Stay active  Will start lisinopril 10 mg daily  Will check labs  Follow up:  Follow up in 3 weeks to establish care and BP recheck     Ivonne Andrew, NP 05/07/2021

## 2021-05-07 NOTE — Assessment & Plan Note (Signed)
Hypertension:  Low sodium diet  Stay well hydrated  Stay active  Will start lisinopril 10 mg daily  Will check labs  Follow up:  Follow up in 3 weeks to establish care and BP recheck

## 2021-05-08 LAB — COMPREHENSIVE METABOLIC PANEL
ALT: 24 IU/L (ref 0–44)
AST: 19 IU/L (ref 0–40)
Albumin/Globulin Ratio: 1.6 (ref 1.2–2.2)
Albumin: 4.6 g/dL (ref 3.8–4.9)
Alkaline Phosphatase: 68 IU/L (ref 44–121)
BUN/Creatinine Ratio: 22 — ABNORMAL HIGH (ref 9–20)
BUN: 18 mg/dL (ref 6–24)
Bilirubin Total: <0.2 mg/dL (ref 0.0–1.2)
CO2: 21 mmol/L (ref 20–29)
Calcium: 9.4 mg/dL (ref 8.7–10.2)
Chloride: 102 mmol/L (ref 96–106)
Creatinine, Ser: 0.82 mg/dL (ref 0.76–1.27)
Globulin, Total: 2.8 g/dL (ref 1.5–4.5)
Glucose: 119 mg/dL — ABNORMAL HIGH (ref 70–99)
Potassium: 4.4 mmol/L (ref 3.5–5.2)
Sodium: 139 mmol/L (ref 134–144)
Total Protein: 7.4 g/dL (ref 6.0–8.5)
eGFR: 103 mL/min/{1.73_m2} (ref >59)

## 2021-05-08 LAB — CBC
Hematocrit: 42.3 % (ref 37.5–51.0)
Hemoglobin: 14.5 g/dL (ref 13.0–17.7)
MCH: 32.7 pg (ref 26.6–33.0)
MCHC: 34.3 g/dL (ref 31.5–35.7)
MCV: 96 fL (ref 79–97)
Platelets: 202 10*3/uL (ref 150–450)
RBC: 4.43 x10E6/uL (ref 4.14–5.80)
RDW: 12.5 % (ref 11.6–15.4)
WBC: 7.5 10*3/uL (ref 3.4–10.8)

## 2021-05-20 ENCOUNTER — Ambulatory Visit (INDEPENDENT_AMBULATORY_CARE_PROVIDER_SITE_OTHER): Payer: BLUE CROSS/BLUE SHIELD | Admitting: Family Medicine

## 2021-05-20 ENCOUNTER — Encounter: Payer: Self-pay | Admitting: Family Medicine

## 2021-05-20 ENCOUNTER — Other Ambulatory Visit: Payer: Self-pay

## 2021-05-20 VITALS — BP 149/88 | HR 74 | Temp 97.6°F | Resp 16 | Ht 74.0 in | Wt 196.4 lb

## 2021-05-20 DIAGNOSIS — I10 Essential (primary) hypertension: Secondary | ICD-10-CM | POA: Diagnosis not present

## 2021-05-20 MED ORDER — LISINOPRIL 20 MG PO TABS: 20.0000 mg | ORAL_TABLET | Freq: Every day | ORAL | 0 refills | Status: DC

## 2021-05-20 NOTE — Progress Notes (Signed)
Patient is feeling much better than before. Patient is please with his outcome

## 2021-05-20 NOTE — Progress Notes (Signed)
   Established  Patient Office Visit  Subjective:  Patient ID: Todd [name], male    DOB: [birth year]  Age: 55 y.o. MRN: [id number]  CC:  Chief Complaint  Patient presents with   Follow-up    HPI Todd [name] presents for follow up of hypertension. Patient denies acute complaints or concerns.   History reviewed. No pertinent past medical history.  Past Surgical History:  Procedure Laterality Date   HERNIA REPAIR     SHOULDER SURGERY      Family History  Problem Relation Age of Onset   Cancer Mother    Cancer Father     Social History   Socioeconomic History   Marital status: Divorced    Spouse name: Not on file   Number of children: Not on file   Years of education: Not on file   Highest education level: Not on file  Occupational History   Not on file  Tobacco Use   Smoking status: Every Day    Packs/day: 0.33    Types: Cigarettes   Smokeless tobacco: Never  Substance and Sexual Activity   Alcohol use: No   Drug use: No   Sexual activity: Yes  Other Topics Concern   Not on file  Social History Narrative   Not on file   Social Determinants of Health   Financial Resource Strain: Not on file  Food Insecurity: Not on file  Transportation Needs: Not on file  Physical Activity: Not on file  Stress: Not on file  Social Connections: Not on file  Intimate Partner Violence: Not on file    ROS Review of Systems  All other systems reviewed and are negative.  Objective:   Today's Vitals: BP (!) 149/88   Pulse 74   Temp 97.6 F (36.4 C) (Oral)   Resp 16   Ht 6\' 2"  (1.88 m)   Wt 196 lb 6.4 oz (89.1 kg)   SpO2 93%   BMI 25.22 kg/m   Physical Exam Vitals and nursing note reviewed.  Constitutional:      General: He is not in acute distress. Cardiovascular:     Rate and Rhythm: Normal rate and regular rhythm.  Pulmonary:     Effort: Pulmonary effort is normal.     Breath sounds: Normal breath sounds.  Abdominal:     Palpations: Abdomen is soft.      Tenderness: There is no abdominal tenderness.  Musculoskeletal:     Right lower leg: No edema.     Left lower leg: No edema.  Neurological:     General: No focal deficit present.     Mental Status: He is alert and oriented to person, place, and time.    Assessment & Plan:   1. Essential hypertension Improved readings but still slightly elevated. Will increase lisinopril form 10 mg to 20 mg daily and monitor.     Outpatient Encounter Medications as of 05/20/2021  Medication Sig   lisinopril (ZESTRIL) 20 MG tablet Take 1 tablet (20 mg total) by mouth daily.   [DISCONTINUED] lisinopril (ZESTRIL) 10 MG tablet Take 1 tablet (10 mg total) by mouth daily.   [DISCONTINUED] meloxicam (MOBIC) 15 MG tablet Take 0.5-1 tablets (7.5-15 mg total) by mouth daily.   No facility-administered encounter medications on file as of 05/20/2021.    Follow-up: Return in about 3 weeks (around 06/10/2021) for follow up.   06/12/2021, MD

## 2021-06-10 ENCOUNTER — Ambulatory Visit (INDEPENDENT_AMBULATORY_CARE_PROVIDER_SITE_OTHER): Payer: BLUE CROSS/BLUE SHIELD | Admitting: Family Medicine

## 2021-06-10 ENCOUNTER — Encounter: Payer: Self-pay | Admitting: Family Medicine

## 2021-06-10 ENCOUNTER — Other Ambulatory Visit: Payer: Self-pay

## 2021-06-10 VITALS — BP 136/89 | HR 72 | Temp 97.9°F | Resp 16 | Wt 201.2 lb

## 2021-06-10 DIAGNOSIS — I10 Essential (primary) hypertension: Secondary | ICD-10-CM

## 2021-06-10 MED ORDER — LISINOPRIL 20 MG PO TABS: 20.0000 mg | ORAL_TABLET | Freq: Every day | ORAL | 0 refills | Status: DC

## 2021-06-11 NOTE — Progress Notes (Signed)
   Established Patient Office Visit  Subjective:  Patient ID: Todd [name], male    DOB: [birth year]  Age: 55 y.o. MRN: [id number]  CC:  Chief Complaint  Patient presents with   Follow-up    HPI Todd [name] presents for follow up of hypertension. Patient denies acute complaints.   History reviewed. No pertinent past medical history.    Social History   Socioeconomic History   Marital status: Divorced    Spouse name: Not on file   Number of children: Not on file   Years of education: Not on file   Highest education level: Not on file  Occupational History   Not on file  Tobacco Use   Smoking status: Every Day    Packs/day: 0.33    Types: Cigarettes   Smokeless tobacco: Never  Substance and Sexual Activity   Alcohol use: No   Drug use: No   Sexual activity: Yes  Other Topics Concern   Not on file  Social History Narrative   Not on file   Social Determinants of Health   Financial Resource Strain: Not on file  Food Insecurity: Not on file  Transportation Needs: Not on file  Physical Activity: Not on file  Stress: Not on file  Social Connections: Not on file  Intimate Partner Violence: Not on file    ROS Review of Systems  All other systems reviewed and are negative.  Objective:   Today's Vitals: BP 136/89   Pulse 72   Temp 97.9 F (36.6 C) (Oral)   Resp 16   Wt 201 lb 3.2 oz (91.3 kg)   SpO2 98%   BMI 25.83 kg/m   Physical Exam Vitals and nursing note reviewed.  Constitutional:      General: He is not in acute distress. Cardiovascular:     Rate and Rhythm: Normal rate and regular rhythm.  Pulmonary:     Effort: Pulmonary effort is normal.     Breath sounds: Normal breath sounds.  Abdominal:     Palpations: Abdomen is soft.     Tenderness: There is no abdominal tenderness.  Musculoskeletal:     Right lower leg: No edema.     Left lower leg: No edema.  Neurological:     General: No focal deficit present.     Mental Status: He is alert  and oriented to person, place, and time.    Assessment & Plan:   1. Essential hypertension Much improved with present management and readings only very slightly above goal. Will continue present management and monitor. Meds refilled.     Outpatient Encounter Medications as of 06/10/2021  Medication Sig   lisinopril (ZESTRIL) 20 MG tablet Take 1 tablet (20 mg total) by mouth daily.   [DISCONTINUED] lisinopril (ZESTRIL) 20 MG tablet Take 1 tablet (20 mg total) by mouth daily.   No facility-administered encounter medications on file as of 06/10/2021.    Follow-up: Return in about 3 months (around 09/10/2021) for follow up.   Tommie Raymond, MD

## 2021-08-26 ENCOUNTER — Ambulatory Visit (INDEPENDENT_AMBULATORY_CARE_PROVIDER_SITE_OTHER): Payer: BLUE CROSS/BLUE SHIELD | Admitting: Family Medicine

## 2021-08-26 ENCOUNTER — Encounter: Payer: Self-pay | Admitting: Family Medicine

## 2021-08-26 ENCOUNTER — Other Ambulatory Visit: Payer: Self-pay

## 2021-08-26 VITALS — BP 133/84 | HR 78 | Temp 98.6°F | Resp 16 | Ht 74.0 in | Wt 207.8 lb

## 2021-08-26 DIAGNOSIS — I10 Essential (primary) hypertension: Secondary | ICD-10-CM | POA: Diagnosis not present

## 2021-08-26 MED ORDER — LISINOPRIL 20 MG PO TABS: 20.0000 mg | ORAL_TABLET | Freq: Every day | ORAL | 1 refills | Status: DC

## 2021-08-26 NOTE — Progress Notes (Signed)
° °  Established Patient Office Visit  Subjective:  Patient ID: Todd [name], male    DOB: [birth year]  Age: 57 y.o. MRN: [id number]  CC:  Chief Complaint  Patient presents with   Hypertension    HPI Todd [name] presents for follow up of hypertension. Patient denies acute complaints or concerns.   History reviewed. No pertinent past medical history.  Past Surgical History:  Procedure Laterality Date   HERNIA REPAIR     SHOULDER SURGERY      Family History  Problem Relation Age of Onset   Cancer Mother    Cancer Father     Social History   Socioeconomic History   Marital status: Divorced    Spouse name: Not on file   Number of children: Not on file   Years of education: Not on file   Highest education level: Not on file  Occupational History   Not on file  Tobacco Use   Smoking status: Every Day    Packs/day: 0.33    Types: Cigarettes   Smokeless tobacco: Never  Substance and Sexual Activity   Alcohol use: No   Drug use: No   Sexual activity: Yes  Other Topics Concern   Not on file  Social History Narrative   Not on file   Social Determinants of Health   Financial Resource Strain: Not on file  Food Insecurity: Not on file  Transportation Needs: Not on file  Physical Activity: Not on file  Stress: Not on file  Social Connections: Not on file  Intimate Partner Violence: Not on file    ROS Review of Systems  All other systems reviewed and are negative.  Objective:   Today's Vitals: BP 133/84    Pulse 78    Temp 98.6 F (37 C) (Oral)    Resp 16    Ht 6\' 2"  (1.88 m)    Wt 207 lb 12.8 oz (94.3 kg)    SpO2 96%    BMI 26.68 kg/m   Physical Exam Vitals and nursing note reviewed.  Constitutional:      General: He is not in acute distress. Cardiovascular:     Rate and Rhythm: Normal rate and regular rhythm.  Pulmonary:     Effort: Pulmonary effort is normal.     Breath sounds: Normal breath sounds.  Abdominal:     Palpations: Abdomen is soft.      Tenderness: There is no abdominal tenderness.  Musculoskeletal:     Right lower leg: No edema.     Left lower leg: No edema.  Neurological:     General: No focal deficit present.     Mental Status: He is alert and oriented to person, place, and time.    Assessment & Plan:   1. Essential hypertension Improved readings with present management. Continue. 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    Outpatient Encounter Medications as of 08/26/2021  Medication Sig   lisinopril (ZESTRIL) 20 MG tablet Take 1 tablet (20 mg total) by mouth daily.   [DISCONTINUED] lisinopril (ZESTRIL) 20 MG tablet Take 1 tablet (20 mg total) by mouth daily.   No facility-administered encounter medications on file as of 08/26/2021.    Follow-up: No follow-ups on file.   10/24/2021, MD

## 2021-08-26 NOTE — Progress Notes (Signed)
Patient is her to get form filled out per job that his HTN is being treated by provider.  No other concerns today

## 2021-09-10 ENCOUNTER — Ambulatory Visit: Payer: BLUE CROSS/BLUE SHIELD | Admitting: Family Medicine

## 2021-10-29 ENCOUNTER — Ambulatory Visit: Payer: Self-pay | Admitting: *Deleted

## 2021-10-29 NOTE — Telephone Encounter (Signed)
?  Chief Complaint: Bilateral calf  cramping, CP ?Symptoms: Cramping both calves, "Bad" have to stop and sit down. Also reports CP,left sided "Where my heart is at ." Intermittent, 3-4/10 ,duration 10 minutes to an hour. ?Frequency: 2 weeks ago ?Pertinent Negatives: Patient denies redness, warmth of calves ?Disposition: [x] ED /[] Urgent Care (no appt availability in office) / [] Appointment(In office/virtual)/ []  [facility]/ [] Home Care/ [] Refused Recommended Disposition /[] [facility]/ []  Follow-up with PCP ?Additional Notes: Advised ED, declines disposition. Assured pt NT would route to PCP for review. ?Reason for Disposition ? Chest pain ? ?Answer Assessment - Initial Assessment Questions ?1. ONSET: "When did the pain start?"  ?    Cramping of both legs for 2 weeks ?2. LOCATION: "Where is the pain located?"  ?    Both calves ?3. PAIN: "How bad is the pain?"    (Scale 1-10; or mild, moderate, severe) ?  -  MILD (1-3): doesn't interfere with normal activities  ?  -  MODERATE (4-7): interferes with normal activities (e.g., work or school) or awakens from sleep, limping  ?  -  SEVERE (8-10): excruciating pain, unable to do any normal activities, unable to walk ?    'BAD" ?4. WORK OR EXERCISE: "Has there been any recent work or exercise that involved this part of the body?"  ?    no ?5. CAUSE: "What do you think is causing the leg pain?" ?    Maybe the lisinopril ?6. OTHER SYMPTOMS: "Do you have any other symptoms?" (e.g., chest pain, back pain, breathing difficulty, swelling, rash, fever, numbness, weakness) ?    Chest pain 3-4/10 intermittent, lasts 1 hour or 10 minutes,left sided. Occurs daily ? ?Protocols used: Leg Pain-A-AH ? ?

## 2022-01-08 ENCOUNTER — Encounter: Payer: Self-pay | Admitting: Family Medicine

## 2022-02-23 ENCOUNTER — Ambulatory Visit: Payer: BLUE CROSS/BLUE SHIELD | Admitting: Family Medicine

## 2022-04-06 ENCOUNTER — Encounter: Payer: Self-pay | Admitting: Family Medicine

## 2022-04-06 ENCOUNTER — Ambulatory Visit (INDEPENDENT_AMBULATORY_CARE_PROVIDER_SITE_OTHER): Payer: BLUE CROSS/BLUE SHIELD | Admitting: Family Medicine

## 2022-04-06 VITALS — BP 131/85 | HR 75 | Temp 98.1°F | Resp 16 | Wt 210.0 lb

## 2022-04-06 DIAGNOSIS — R609 Edema, unspecified: Secondary | ICD-10-CM | POA: Diagnosis not present

## 2022-04-06 DIAGNOSIS — I10 Essential (primary) hypertension: Secondary | ICD-10-CM

## 2022-04-06 MED ORDER — LISINOPRIL 20 MG PO TABS: 20.0000 mg | ORAL_TABLET | Freq: Every day | ORAL | 1 refills | Status: DC

## 2022-04-06 NOTE — Progress Notes (Unsigned)
Patient is her for follow-up hypertension Patient is concern about always feeling very tired and legs swelling. Patient not sure if it comes from standing at work.

## 2022-04-07 ENCOUNTER — Encounter: Payer: Self-pay | Admitting: Family Medicine

## 2022-04-07 NOTE — Progress Notes (Signed)
   Established Patient Office Visit  Subjective    Patient ID: Todd [name], male    DOB: 01-May-1966  Age: 56 y.o. MRN: [id number]  CC:  Chief Complaint  Patient presents with   Follow-up   Hypertension    HPI Todd [name] presents for follow up of hypertension. Patient also reports some swelling in his legs primarily after work which resolves at night when sleeping.    Outpatient Encounter Medications as of 04/06/2022  Medication Sig   lisinopril (ZESTRIL) 20 MG tablet Take 1 tablet (20 mg total) by mouth daily.   [DISCONTINUED] lisinopril (ZESTRIL) 20 MG tablet Take 1 tablet (20 mg total) by mouth daily.   [DISCONTINUED] lisinopril (ZESTRIL) 20 MG tablet Take 1 tablet (20 mg total) by mouth daily.   No facility-administered encounter medications on file as of 04/06/2022.    No past medical history on file.  Past Surgical History:  Procedure Laterality Date   HERNIA REPAIR     SHOULDER SURGERY      Family History  Problem Relation Age of Onset   Cancer Mother    Cancer Father     Social History   Socioeconomic History   Marital status: Divorced    Spouse name: Not on file   Number of children: Not on file   Years of education: Not on file   Highest education level: Not on file  Occupational History   Not on file  Tobacco Use   Smoking status: Every Day    Packs/day: 0.33    Types: Cigarettes   Smokeless tobacco: Never  Substance and Sexual Activity   Alcohol use: No   Drug use: No   Sexual activity: Yes  Other Topics Concern   Not on file  Social History Narrative   Not on file   Social Determinants of Health   Financial Resource Strain: Not on file  Food Insecurity: Not on file  Transportation Needs: Not on file  Physical Activity: Not on file  Stress: Not on file  Social Connections: Not on file  Intimate Partner Violence: Not on file    Review of Systems  Cardiovascular:  Positive for leg swelling. Negative for chest pain.  All other  systems reviewed and are negative.       Objective    BP 131/85   Pulse 75   Temp 98.1 F (36.7 C) (Oral)   Resp 16   Wt 210 lb (95.3 kg)   SpO2 92%   BMI 26.96 kg/m   Physical Exam Vitals and nursing note reviewed.  Constitutional:      General: He is not in acute distress. Cardiovascular:     Rate and Rhythm: Normal rate and regular rhythm.  Pulmonary:     Effort: Pulmonary effort is normal.     Breath sounds: Normal breath sounds.  Abdominal:     Palpations: Abdomen is soft.     Tenderness: There is no abdominal tenderness.  Neurological:     General: No focal deficit present.     Mental Status: He is alert and oriented to person, place, and time.         Assessment & Plan:   1. Essential hypertension Improved. Appears stable. Continue. Meds refilled.  2. Edema, unspecified type Patient will utilize support socks and leg elevations as appropriate. Will monitor    Return in about 6 months (around 10/05/2022) for physical.   Todd Raymond, MD

## 2022-10-05 ENCOUNTER — Ambulatory Visit (INDEPENDENT_AMBULATORY_CARE_PROVIDER_SITE_OTHER): Payer: BLUE CROSS/BLUE SHIELD | Admitting: Family Medicine

## 2022-10-05 ENCOUNTER — Encounter: Payer: Self-pay | Admitting: Family Medicine

## 2022-10-05 VITALS — BP 121/76 | HR 73 | Temp 98.1°F | Resp 16 | Ht 74.0 in | Wt 214.6 lb

## 2022-10-05 DIAGNOSIS — Z130 Encounter for screening for diseases of the blood and blood-forming organs and certain disorders involving the immune mechanism: Secondary | ICD-10-CM | POA: Diagnosis not present

## 2022-10-05 DIAGNOSIS — Z13228 Encounter for screening for other metabolic disorders: Secondary | ICD-10-CM | POA: Diagnosis not present

## 2022-10-05 DIAGNOSIS — F17210 Nicotine dependence, cigarettes, uncomplicated: Secondary | ICD-10-CM | POA: Diagnosis not present

## 2022-10-05 DIAGNOSIS — Z0000 Encounter for general adult medical examination without abnormal findings: Secondary | ICD-10-CM

## 2022-10-05 DIAGNOSIS — Z125 Encounter for screening for malignant neoplasm of prostate: Secondary | ICD-10-CM

## 2022-10-05 DIAGNOSIS — Z13220 Encounter for screening for lipoid disorders: Secondary | ICD-10-CM | POA: Diagnosis not present

## 2022-10-05 DIAGNOSIS — Z1329 Encounter for screening for other suspected endocrine disorder: Secondary | ICD-10-CM | POA: Diagnosis not present

## 2022-10-05 DIAGNOSIS — Z1211 Encounter for screening for malignant neoplasm of colon: Secondary | ICD-10-CM

## 2022-10-05 DIAGNOSIS — Z1159 Encounter for screening for other viral diseases: Secondary | ICD-10-CM

## 2022-10-06 LAB — CMP14+EGFR
ALT: 22 IU/L (ref 0–44)
AST: 18 IU/L (ref 0–40)
Albumin/Globulin Ratio: 1.8 (ref 1.2–2.2)
Albumin: 4.6 g/dL (ref 3.8–4.9)
Alkaline Phosphatase: 62 IU/L (ref 44–121)
BUN/Creatinine Ratio: 24 — ABNORMAL HIGH (ref 9–20)
BUN: 20 mg/dL (ref 6–24)
Bilirubin Total: <0.2 mg/dL (ref 0.0–1.2)
CO2: 19 mmol/L — ABNORMAL LOW (ref 20–29)
Calcium: 9.3 mg/dL (ref 8.7–10.2)
Chloride: 102 mmol/L (ref 96–106)
Creatinine, Ser: 0.85 mg/dL (ref 0.76–1.27)
Globulin, Total: 2.6 g/dL (ref 1.5–4.5)
Glucose: 100 mg/dL — ABNORMAL HIGH (ref 70–99)
Potassium: 4.4 mmol/L (ref 3.5–5.2)
Sodium: 137 mmol/L (ref 134–144)
Total Protein: 7.2 g/dL (ref 6.0–8.5)
eGFR: 101 mL/min/{1.73_m2} (ref >59)

## 2022-10-06 LAB — CBC WITH DIFFERENTIAL/PLATELET
Basophils Absolute: 0.1 10*3/uL (ref 0.0–0.2)
Basos: 2 %
EOS (ABSOLUTE): 0.3 10*3/uL (ref 0.0–0.4)
Eos: 3 %
Hematocrit: 40.2 % (ref 37.5–51.0)
Hemoglobin: 13.4 g/dL (ref 13.0–17.7)
Immature Grans (Abs): 0.1 10*3/uL (ref 0.0–0.1)
Immature Granulocytes: 1 %
Lymphocytes Absolute: 2.4 10*3/uL (ref 0.7–3.1)
Lymphs: 29 %
MCH: 31.9 pg (ref 26.6–33.0)
MCHC: 33.3 g/dL (ref 31.5–35.7)
MCV: 96 fL (ref 79–97)
Monocytes Absolute: 0.8 10*3/uL (ref 0.1–0.9)
Monocytes: 10 %
Neutrophils Absolute: 4.6 10*3/uL (ref 1.4–7.0)
Neutrophils: 55 %
Platelets: 225 10*3/uL (ref 150–450)
RBC: 4.2 x10E6/uL (ref 4.14–5.80)
RDW: 13 % (ref 11.6–15.4)
WBC: 8.2 10*3/uL (ref 3.4–10.8)

## 2022-10-06 LAB — LIPID PANEL
Chol/HDL Ratio: 5.9 ratio — ABNORMAL HIGH (ref 0.0–5.0)
Cholesterol, Total: 275 mg/dL — ABNORMAL HIGH (ref 100–199)
HDL: 47 mg/dL (ref >39)
LDL Chol Calc (NIH): 180 mg/dL — ABNORMAL HIGH (ref 0–99)
Triglycerides: 253 mg/dL — ABNORMAL HIGH (ref 0–149)
VLDL Cholesterol Cal: 48 mg/dL — ABNORMAL HIGH (ref 5–40)

## 2022-10-06 LAB — PSA: Prostate Specific Ag, Serum: 0.6 ng/mL (ref 0.0–4.0)

## 2022-10-06 LAB — HEPATITIS C ANTIBODY: Hep C Virus Ab: NONREACTIVE

## 2022-10-07 ENCOUNTER — Encounter: Payer: Self-pay | Admitting: Family Medicine

## 2022-10-07 NOTE — Progress Notes (Signed)
Established Patient Office Visit  Subjective    Patient ID: Todd [name], male    DOB: Nov 23, 1965  Age: 57 y.o. MRN: [id number]  CC: No chief complaint on file.   HPI Todd [name] presents for routine annual exam. Patient denies acute complaints or concerns.    Outpatient Encounter Medications as of 10/05/2022  Medication Sig   lisinopril (ZESTRIL) 20 MG tablet Take 1 tablet (20 mg total) by mouth daily.   No facility-administered encounter medications on file as of 10/05/2022.    No past medical history on file.  Past Surgical History:  Procedure Laterality Date   HERNIA REPAIR     SHOULDER SURGERY      Family History  Problem Relation Age of Onset   Cancer Mother    Cancer Father     Social History   Socioeconomic History   Marital status: Divorced    Spouse name: Not on file   Number of children: Not on file   Years of education: Not on file   Highest education level: Not on file  Occupational History   Not on file  Tobacco Use   Smoking status: Every Day    Packs/day: .33    Types: Cigarettes   Smokeless tobacco: Never  Substance and Sexual Activity   Alcohol use: No   Drug use: No   Sexual activity: Yes  Other Topics Concern   Not on file  Social History Narrative   Not on file   Social Determinants of Health   Financial Resource Strain: Low Risk  (10/05/2022)   Overall Financial Resource Strain (CARDIA)    Difficulty of Paying Living Expenses: Not hard at all  Food Insecurity: No Food Insecurity (10/05/2022)   Hunger Vital Sign    Worried About Running Out of Food in the Last Year: Never true    [place] in the Last Year: Never true  Transportation Needs: No Transportation Needs (10/05/2022)   PRAPARE - Hydrologist (Medical): No    Lack of Transportation (Non-Medical): No  Physical Activity: Sufficiently Active (10/05/2022)   Exercise Vital Sign    Days of Exercise per Week: 7 days    Minutes of  Exercise per Session: 40 min  Stress: No Stress Concern Present (10/05/2022)   [place]    Feeling of Stress : Not at all  Social Connections: Socially Isolated (10/05/2022)   Social Connection and Isolation Panel [NHANES]    Frequency of Communication with Friends and Family: More than three times a week    Frequency of Social Gatherings with Friends and Family: More than three times a week    Attends Religious Services: Never    Marine scientist or Organizations: No    Attends Archivist Meetings: Never    Marital Status: Divorced  Human resources officer Violence: Not At Risk (10/05/2022)   Humiliation, Afraid, Rape, and Kick questionnaire    Fear of Current or Ex-Partner: No    Emotionally Abused: No    Physically Abused: No    Sexually Abused: No    Review of Systems  All other systems reviewed and are negative.       Objective    BP 121/76   Pulse 73   Temp 98.1 F (36.7 C) (Oral)   Resp 16   Ht '6\' 2"'$  (1.88 m)   Wt 214 lb 9.6 oz (97.3 kg)  SpO2 96%   BMI 27.55 kg/m   Physical Exam Vitals and nursing note reviewed.  Constitutional:      General: He is not in acute distress. HENT:     Head: Normocephalic and atraumatic.     Right Ear: Tympanic membrane, ear canal and external ear normal.     Left Ear: Tympanic membrane, ear canal and external ear normal.     Nose: Nose normal.     Mouth/Throat:     Mouth: Mucous membranes are moist.     Pharynx: Oropharynx is clear.  Eyes:     Conjunctiva/sclera: Conjunctivae normal.     Pupils: Pupils are equal, round, and reactive to light.  Neck:     Thyroid: No thyromegaly.  Cardiovascular:     Rate and Rhythm: Normal rate and regular rhythm.     Heart sounds: Normal heart sounds. No murmur heard. Pulmonary:     Effort: Pulmonary effort is normal.     Breath sounds: Normal breath sounds.  Abdominal:     General: There is no distension.      Palpations: Abdomen is soft. There is no mass.     Tenderness: There is no abdominal tenderness.     Hernia: There is no hernia in the left inguinal area or right inguinal area.  Genitourinary:    Penis: Normal and circumcised.      Testes: Normal.  Musculoskeletal:        General: Normal range of motion.     Cervical back: Normal range of motion and neck supple.     Right lower leg: No edema.     Left lower leg: No edema.  Skin:    General: Skin is warm and dry.  Neurological:     General: No focal deficit present.     Mental Status: He is alert and oriented to person, place, and time. Mental status is at baseline.  Psychiatric:        Mood and Affect: Mood normal.        Behavior: Behavior normal.         Assessment & Plan:   1. Annual physical exam  - CMP14+EGFR  2. Screening for deficiency anemia  - CBC with Differential  3. Screening for lipid disorders  - Lipid Panel  4. Screening for endocrine/metabolic/immunity disorders   5. Need for hepatitis C screening test   6. Screening for prostate cancer  - PSA - Hepatitis C Antibody  7. Screening for colon cancer  - Cologuard    Return in about 6 months (around 04/07/2023).   Becky Sax, MD

## 2022-10-08 ENCOUNTER — Other Ambulatory Visit: Payer: Self-pay | Admitting: Family Medicine

## 2022-10-08 MED ORDER — ATORVASTATIN CALCIUM 20 MG PO TABS: 20.0000 mg | ORAL_TABLET | Freq: Every day | ORAL | 3 refills | Status: DC

## 2022-11-15 LAB — COLOGUARD: COLOGUARD: POSITIVE — AB

## 2022-11-16 ENCOUNTER — Encounter: Payer: Self-pay | Admitting: *Deleted

## 2022-11-16 ENCOUNTER — Other Ambulatory Visit: Payer: Self-pay | Admitting: Family Medicine

## 2022-11-16 DIAGNOSIS — R195 Other fecal abnormalities: Secondary | ICD-10-CM

## 2022-11-17 ENCOUNTER — Encounter: Payer: Self-pay | Admitting: Gastroenterology

## 2022-11-17 ENCOUNTER — Telehealth: Payer: Self-pay

## 2022-11-17 NOTE — Telephone Encounter (Signed)
Pt given lab results per notes of Dr. Andrey Campanile on 11/17/22. Pt verbalized understanding. Provided with [facility] # for scheduling colonoscopy.   Georganna Skeans, MD 11/16/2022  8:12 AM EDT     Positive Cologuard. Recommend follow up colposcopy.

## 2022-11-26 ENCOUNTER — Encounter: Payer: Self-pay | Admitting: Gastroenterology

## 2022-11-26 ENCOUNTER — Ambulatory Visit (AMBULATORY_SURGERY_CENTER): Payer: BLUE CROSS/BLUE SHIELD | Admitting: *Deleted

## 2022-11-26 VITALS — Ht 74.0 in | Wt 210.0 lb

## 2022-11-26 DIAGNOSIS — R195 Other fecal abnormalities: Secondary | ICD-10-CM

## 2022-11-26 MED ORDER — NA SULFATE-K SULFATE-MG SULF 17.5-3.13-1.6 GM/177ML PO SOLN: 1.0000 | Freq: Once | ORAL | 0 refills | Status: AC

## 2022-11-26 NOTE — Progress Notes (Signed)
No egg or soy allergy known to patient  No issues known to pt with past sedation with any surgeries or procedures Patient denies ever being told they had issues or difficulty with intubation  No FH of Malignant Hyperthermia Pt is not on diet pills Pt is not on  home 02  Pt is not on blood thinners  Pt denies issues with constipation  No A fib or A flutter Have any cardiac testing pending--no Pt instructed to use Singlecare.[email or web address] or GoodRx for a price reduction on prep    Independently with mobility  Patient's chart reviewed by Cathlyn Parsons CNRA prior to previsit and patient appropriate for the [facility].  Previsit completed and red dot placed by patient's name on their procedure day (on provider's schedule).

## 2022-12-02 ENCOUNTER — Other Ambulatory Visit: Payer: Self-pay | Admitting: Family Medicine

## 2022-12-02 NOTE — Telephone Encounter (Signed)
Requested Prescriptions  Pending Prescriptions Disp Refills   lisinopril (ZESTRIL) 20 MG tablet [Pharmacy Med Name: LISINOPRIL 20 MG TABLET] 90 tablet 0    Sig: TAKE 1 TABLET BY MOUTH EVERY DAY     Cardiovascular:  ACE Inhibitors Passed - 12/02/2022  1:09 PM      Passed - Cr in normal range and within 180 days    Creatinine, Ser  Date Value Ref Range Status  10/05/2022 0.85 0.76 - 1.27 mg/dL Final         Passed - K in normal range and within 180 days    Potassium  Date Value Ref Range Status  10/05/2022 4.4 3.5 - 5.2 mmol/L Final         Passed - Patient is not pregnant      Passed - Last BP in normal range    BP Readings from Last 1 Encounters:  10/05/22 121/76         Passed - Valid encounter within last 6 months    Recent Outpatient Visits           1 month ago Annual physical exam   [facility], MD   8 months ago Essential hypertension   [facility], MD   1 year ago Essential hypertension   [facility], MD   1 year ago Essential hypertension   [facility], MD   1 year ago Essential hypertension   [facility], MD       Future Appointments             In 4 months Georganna Skeans, MD [facility]

## 2022-12-15 ENCOUNTER — Encounter: Payer: Self-pay | Admitting: Gastroenterology

## 2022-12-15 ENCOUNTER — Ambulatory Visit (AMBULATORY_SURGERY_CENTER): Payer: BLUE CROSS/BLUE SHIELD | Admitting: Gastroenterology

## 2022-12-15 VITALS — BP 127/88 | HR 76 | Temp 98.9°F | Resp 11 | Ht 74.0 in | Wt 210.0 lb

## 2022-12-15 DIAGNOSIS — Z1211 Encounter for screening for malignant neoplasm of colon: Secondary | ICD-10-CM | POA: Diagnosis present

## 2022-12-15 DIAGNOSIS — D120 Benign neoplasm of cecum: Secondary | ICD-10-CM | POA: Diagnosis not present

## 2022-12-15 DIAGNOSIS — K635 Polyp of colon: Secondary | ICD-10-CM | POA: Diagnosis not present

## 2022-12-15 DIAGNOSIS — R195 Other fecal abnormalities: Secondary | ICD-10-CM

## 2022-12-15 MED ORDER — SODIUM CHLORIDE 0.9 % IV SOLN: 500.0000 mL | Freq: Once | INTRAVENOUS | Status: DC

## 2022-12-15 NOTE — Patient Instructions (Signed)
Please read handouts provided. Continue present medications. Await pathology results. Clip Card.   YOU HAD AN ENDOSCOPIC PROCEDURE TODAY AT THE [facility]:   Refer to the procedure report that was given to you for any specific questions about what was found during the examination.  If the procedure report does not answer your questions, please call your gastroenterologist to clarify.  If you requested that your care partner not be given the details of your procedure findings, then the procedure report has been included in a sealed envelope for you to review at your convenience later.  YOU SHOULD EXPECT: Some feelings of bloating in the abdomen. Passage of more gas than usual.  Walking can help get rid of the air that was put into your GI tract during the procedure and reduce the bloating. If you had a lower endoscopy (such as a colonoscopy or flexible sigmoidoscopy) you may notice spotting of blood in your stool or on the toilet paper. If you underwent a bowel prep for your procedure, you may not have a normal bowel movement for a few days.  Please Note:  You might notice some irritation and congestion in your nose or some drainage.  This is from the oxygen used during your procedure.  There is no need for concern and it should clear up in a day or so.  SYMPTOMS TO REPORT IMMEDIATELY:  Following lower endoscopy (colonoscopy or flexible sigmoidoscopy):  Excessive amounts of blood in the stool  Significant tenderness or worsening of abdominal pains  Swelling of the abdomen that is new, acute  Fever of 100F or higher  For urgent or emergent issues, a gastroenterologist can be reached at any hour by calling [phone]. Do not use MyChart messaging for urgent concerns.    DIET:  We do recommend a small meal at first, but then you may proceed to your regular diet.  Drink plenty of fluids but you should avoid alcoholic beverages for 24 hours.  ACTIVITY:  You should plan to take  it easy for the rest of today and you should NOT DRIVE or use heavy machinery until tomorrow (because of the sedation medicines used during the test).    FOLLOW UP: Our staff will call the number listed on your records the next business day following your procedure.  We will call around 7:15- 8:00 am to check on you and address any questions or concerns that you may have regarding the information given to you following your procedure. If we do not reach you, we will leave a message.     If any biopsies were taken you will be contacted by phone or by letter within the next 1-3 weeks.  Please call us at [phone] if you have not heard about the biopsies in 3 weeks.    SIGNATURES/CONFIDENTIALITY: You and/or your care partner have signed paperwork which will be entered into your electronic medical record.  These signatures attest to the fact that that the information above on your After Visit Summary has been reviewed and is understood.  Full responsibility of the confidentiality of this discharge information lies with you and/or your care-partner. 

## 2022-12-15 NOTE — Progress Notes (Signed)
Called to room to assist during endoscopic procedure.  Patient ID and intended procedure confirmed with present staff. Received instructions for my participation in the procedure from the performing physician.  

## 2022-12-15 NOTE — Op Note (Signed)
[facility] Patient Name: Todd [name] Procedure Date: 12/15/2022 1:11 PM MRN: [id number] Endoscopist: Sherilyn Cooter L. Myrtie Neither , MD, [id number] Age: 57 Referring MD:  Date of Birth: 10-Aug-1965 Gender: Male Account #: [id number] Procedure:                Colonoscopy Indications:              Positive Cologuard test Medicines:                Monitored Anesthesia Care Procedure:                Pre-Anesthesia Assessment:                           - Prior to the procedure, a History and Physical                            was performed, and patient medications and                            allergies were reviewed. The patient's tolerance of                            previous anesthesia was also reviewed. The risks                            and benefits of the procedure and the sedation                            options and risks were discussed with the patient.                            All questions were answered, and informed consent                            was obtained. Prior Anticoagulants: The patient has                            taken no anticoagulant or antiplatelet agents. ASA                            Grade Assessment: II - A patient with mild systemic                            disease. After reviewing the risks and benefits,                            the patient was deemed in satisfactory condition to                            undergo the procedure.                           After obtaining informed consent, the colonoscope  was passed under direct vision. Throughout the                            procedure, the patient's blood pressure, pulse, and                            oxygen saturations were monitored continuously. The                            Olympus CF-HQ190L [id number]) Colonoscope was                            introduced through the anus and advanced to the the                            cecum, identified by appendiceal orifice  and                            ileocecal valve. The colonoscopy was performed with                            difficulty due to a redundant colon, significant                            looping and a tortuous colon. Successful completion                            of the procedure was aided by using manual pressure                            and straightening and shortening the scope to                            obtain bowel loop reduction. The patient tolerated                            the procedure well. The quality of the bowel                            preparation was good. The ileocecal valve,                            appendiceal orifice, and rectum were photographed. Scope In: 1:31:36 PM Scope Out: 2:02:06 PM Scope Withdrawal Time: 0 hours 24 minutes 12 seconds  Total Procedure Duration: 0 hours 30 minutes 30 seconds  Findings:                 The perianal and digital rectal examinations were                            normal.                           Repeat examination of right colon under NBI  performed.                           Three flat and mucous-capped polyps were found in                            the cecum. The polyps were 10 to 15 mm in size.                            These polyps were removed with a piecemeal                            technique using a cold snare. Resection and                            retrieval were complete. For hemostasis on one                            polypectomy site, one hemostatic clip was                            successfully placed (MR conditional).                           Diverticula were found in the left colon.                            (Associated hamstring thickening, tortuosity and                            redundancy)                           The exam was otherwise without abnormality on                            direct and retroflexion views. Complications:            No immediate  complications. Estimated Blood Loss:     Estimated blood loss was minimal. Impression:               - Three 10 to 15 mm polyps in the cecum, removed                            piecemeal using a cold snare. Resected and                            retrieved. One clip (MR conditional) was placed.                           - Diverticulosis in the left colon.                           - The examination was otherwise normal on direct  and retroflexion views. Recommendation:           - Patient has a contact number available for                            emergencies. The signs and symptoms of potential                            delayed complications were discussed with the                            patient. Return to normal activities tomorrow.                            Written discharge instructions were provided to the                            patient.                           - Resume previous diet.                           - Continue present medications.                           - Await pathology results.                           - Repeat colonoscopy is recommended for                            surveillance. The colonoscopy date will be                            determined after pathology results from today's                            exam become available for review. Sukhman Kocher L. Myrtie Neither, MD 12/15/2022 2:07:25 PM This report has been signed electronically.

## 2022-12-15 NOTE — Progress Notes (Signed)
Sedate, gd SR, tolerated procedure well, VSS, report to RN 

## 2022-12-15 NOTE — Progress Notes (Signed)
History and Physical:  This patient presents for endoscopic testing for: Encounter Diagnosis  Name Primary?   Positive colorectal cancer screening using Cologuard test Yes    Positive cologuard, which was his first CRC screening test. Patient denies chronic abdominal pain, rectal bleeding, constipation or diarrhea.   Patient is otherwise without complaints or active issues today.   Past Medical History: Past Medical History:  Diagnosis Date   Hyperlipidemia    Hypertension      Past Surgical History: Past Surgical History:  Procedure Laterality Date   HERNIA REPAIR     SHOULDER SURGERY Right     Allergies: No Known Allergies  Outpatient Meds: Current Outpatient Medications  Medication Sig Dispense Refill   atorvastatin (LIPITOR) 20 MG tablet Take 1 tablet (20 mg total) by mouth daily. 90 tablet 3   lisinopril (ZESTRIL) 20 MG tablet TAKE 1 TABLET BY MOUTH EVERY DAY 90 tablet 0   Current Facility-Administered Medications  Medication Dose Route Frequency Provider Last Rate Last Admin   0.9 %  sodium chloride infusion  500 mL Intravenous Once Sherrilyn Rist, MD          ___________________________________________________________________ Objective   Exam:  BP 137/80   Pulse 70   Temp 98.9 F (37.2 C)   Ht 6\' 2"  (1.88 m)   Wt 210 lb (95.3 kg)   SpO2 98%   BMI 26.96 kg/m   CV: regular , S1/S2 Resp: clear to auscultation bilaterally, normal RR and effort noted GI: soft, no tenderness, with active bowel sounds.   Assessment: Encounter Diagnosis  Name Primary?   Positive colorectal cancer screening using Cologuard test Yes     Plan: Colonoscopy   The benefits and risks of the planned procedure were described in detail with the patient or (when appropriate) their health care proxy.  Risks were outlined as including, but not limited to, bleeding, infection, perforation, adverse medication reaction leading to cardiac or pulmonary decompensation,  pancreatitis (if ERCP).  The limitation of incomplete mucosal visualization was also discussed.  No guarantees or warranties were given.  The patient is appropriate for an endoscopic procedure in the ambulatory setting.   - Amada Jupiter, MD

## 2022-12-16 ENCOUNTER — Telehealth: Payer: Self-pay

## 2022-12-16 NOTE — Telephone Encounter (Signed)
  Follow up Call-     12/15/2022   12:47 PM  Call back number  Post procedure Call Back phone  # [phone]  Permission to leave phone message Yes     Patient questions:  Do you have a fever, pain , or abdominal swelling? No. Pain Score  0 *  Have you tolerated food without any problems? Yes.    Have you been able to return to your normal activities? Yes.    Do you have any questions about your discharge instructions: Diet   No. Medications  No. Follow up visit  No.  Do you have questions or concerns about your Care? No.  Actions: * If pain score is 4 or above: No action needed, pain <4.

## 2022-12-23 ENCOUNTER — Encounter: Payer: Self-pay | Admitting: Gastroenterology

## 2023-04-05 ENCOUNTER — Other Ambulatory Visit: Payer: Self-pay | Admitting: Family Medicine

## 2023-04-07 ENCOUNTER — Ambulatory Visit (INDEPENDENT_AMBULATORY_CARE_PROVIDER_SITE_OTHER): Payer: BLUE CROSS/BLUE SHIELD | Admitting: Family Medicine

## 2023-04-07 ENCOUNTER — Encounter: Payer: Self-pay | Admitting: Family Medicine

## 2023-04-07 VITALS — BP 134/81 | HR 73 | Temp 98.0°F | Resp 16 | Ht 74.0 in | Wt 218.4 lb

## 2023-04-07 DIAGNOSIS — E785 Hyperlipidemia, unspecified: Secondary | ICD-10-CM

## 2023-04-07 DIAGNOSIS — I10 Essential (primary) hypertension: Secondary | ICD-10-CM

## 2023-04-07 MED ORDER — LISINOPRIL 20 MG PO TABS: 20.0000 mg | ORAL_TABLET | Freq: Every day | ORAL | 2 refills | Status: DC

## 2023-04-07 NOTE — Progress Notes (Signed)
Follow up bp.

## 2023-04-11 ENCOUNTER — Encounter: Payer: Self-pay | Admitting: Family Medicine

## 2023-04-11 NOTE — Progress Notes (Signed)
Established Patient Office Visit  Subjective    Patient ID: Todd [name], male    DOB: 1966/03/09  Age: 57 y.o. MRN: [id number]  CC:  Chief Complaint  Patient presents with   Follow-up    B/p    HPI Todd [name] presents for follow up of chronic med issues. Patient denies acute complaints or concerns.   Outpatient Encounter Medications as of 04/07/2023  Medication Sig   atorvastatin (LIPITOR) 20 MG tablet Take 1 tablet (20 mg total) by mouth daily.   [DISCONTINUED] lisinopril (ZESTRIL) 20 MG tablet TAKE 1 TABLET BY MOUTH EVERY DAY   lisinopril (ZESTRIL) 20 MG tablet Take 1 tablet (20 mg total) by mouth daily.   No facility-administered encounter medications on file as of 04/07/2023.    Past Medical History:  Diagnosis Date   Hyperlipidemia    Hypertension     Past Surgical History:  Procedure Laterality Date   HERNIA REPAIR     SHOULDER SURGERY Right     Family History  Problem Relation Age of Onset   Cancer Mother    Cancer Father    Colon cancer Neg Hx    Colon polyps Neg Hx    Crohn's disease Neg Hx    Esophageal cancer Neg Hx    Rectal cancer Neg Hx    Stomach cancer Neg Hx    Ulcerative colitis Neg Hx     Social History   Socioeconomic History   Marital status: Divorced    Spouse name: Not on file   Number of children: Not on file   Years of education: Not on file   Highest education level: Not on file  Occupational History   Not on file  Tobacco Use   Smoking status: Every Day    Current packs/day: 0.33    Types: Cigarettes   Smokeless tobacco: Never  Vaping Use   Vaping status: Never Used  Substance and Sexual Activity   Alcohol use: Yes    Comment: beer after work   Drug use: No   Sexual activity: Yes  Other Topics Concern   Not on file  Social History Narrative   Not on file   Social Determinants of Health   Financial Resource Strain: Low Risk  (10/05/2022)   Overall Financial Resource Strain (CARDIA)    Difficulty of Paying  Living Expenses: Not hard at all  Food Insecurity: No Food Insecurity (10/05/2022)   Hunger Vital Sign    Worried About Running Out of Food in the Last Year: Never true    Ran Out of Food in the Last Year: Never true  Transportation Needs: No Transportation Needs (10/05/2022)   PRAPARE - Administrator, Civil Service (Medical): No    Lack of Transportation (Non-Medical): No  Physical Activity: Sufficiently Active (10/05/2022)   Exercise Vital Sign    Days of Exercise per Week: 7 days    Minutes of Exercise per Session: 40 min  Stress: No Stress Concern Present (04/07/2023)   [employer] of Occupational Health - Occupational Stress Questionnaire    Feeling of Stress : Not at all  Social Connections: Socially Isolated (10/05/2022)   Social Connection and Isolation Panel [NHANES]    Frequency of Communication with Friends and Family: More than three times a week    Frequency of Social Gatherings with Friends and Family: More than three times a week    Attends Religious Services: Never    Database administrator or Organizations:  No    Attends Banker Meetings: Never    Marital Status: Divorced  Intimate Partner Violence: Not At Risk (10/05/2022)   Humiliation, Afraid, Rape, and Kick questionnaire    Fear of Current or Ex-Partner: No    Emotionally Abused: No    Physically Abused: No    Sexually Abused: No    Review of Systems  All other systems reviewed and are negative.       Objective    BP 134/81 (BP Location: Right Arm, Patient Position: Sitting, Cuff Size: Large)   Pulse 73   Temp 98 F (36.7 C) (Oral)   Resp 16   Ht 6\' 2"  (1.88 m)   Wt 218 lb 6.4 oz (99.1 kg)   SpO2 95%   BMI 28.04 kg/m   Physical Exam Vitals and nursing note reviewed.  Constitutional:      General: He is not in acute distress. Cardiovascular:     Rate and Rhythm: Normal rate and regular rhythm.  Pulmonary:     Effort: Pulmonary effort is normal.     Breath sounds:  Normal breath sounds.  Abdominal:     Palpations: Abdomen is soft.     Tenderness: There is no abdominal tenderness.  Neurological:     General: No focal deficit present.     Mental Status: He is alert and oriented to person, place, and time.         Assessment & Plan:   Essential hypertension  Hyperlipidemia, unspecified hyperlipidemia type  Other orders -     Lisinopril; Take 1 tablet (20 mg total) by mouth daily.  Dispense: 90 tablet; Refill: 2     Return in about 6 months (around 10/05/2023) for physical.   Tommie Raymond, MD

## 2023-10-04 ENCOUNTER — Other Ambulatory Visit: Payer: Self-pay | Admitting: Family Medicine

## 2023-10-06 ENCOUNTER — Ambulatory Visit (INDEPENDENT_AMBULATORY_CARE_PROVIDER_SITE_OTHER): Payer: BLUE CROSS/BLUE SHIELD | Admitting: Family Medicine

## 2023-10-06 ENCOUNTER — Encounter: Payer: Self-pay | Admitting: Family Medicine

## 2023-10-06 VITALS — BP 139/77 | HR 76 | Temp 97.8°F | Resp 16 | Ht 74.0 in | Wt 229.9 lb

## 2023-10-06 DIAGNOSIS — Z130 Encounter for screening for diseases of the blood and blood-forming organs and certain disorders involving the immune mechanism: Secondary | ICD-10-CM | POA: Diagnosis not present

## 2023-10-06 DIAGNOSIS — Z0000 Encounter for general adult medical examination without abnormal findings: Secondary | ICD-10-CM | POA: Diagnosis not present

## 2023-10-06 DIAGNOSIS — Z13220 Encounter for screening for lipoid disorders: Secondary | ICD-10-CM

## 2023-10-06 NOTE — Progress Notes (Signed)
 Established Patient Office Visit  Subjective    Patient ID: Todd [name], male    DOB: 11/02/1965  Age: 58 y.o. MRN: [id number]  CC:  Chief Complaint  Patient presents with   Follow-up    6 month    HPI MARTHA SOLTYS presents for routine annual exam. Patient denies acute complaints.   Outpatient Encounter Medications as of 10/06/2023  Medication Sig   atorvastatin (LIPITOR) 20 MG tablet Take 1 tablet (20 mg total) by mouth daily.   lisinopril (ZESTRIL) 20 MG tablet Take 1 tablet (20 mg total) by mouth daily.   No facility-administered encounter medications on file as of 10/06/2023.    Past Medical History:  Diagnosis Date   Hyperlipidemia    Hypertension     Past Surgical History:  Procedure Laterality Date   HERNIA REPAIR     SHOULDER SURGERY Right     Family History  Problem Relation Age of Onset   Cancer Mother    Cancer Father    Colon cancer Neg Hx    Colon polyps Neg Hx    Crohn's disease Neg Hx    Esophageal cancer Neg Hx    Rectal cancer Neg Hx    Stomach cancer Neg Hx    Ulcerative colitis Neg Hx     Social History   Socioeconomic History   Marital status: Divorced    Spouse name: Not on file   Number of children: Not on file   Years of education: Not on file   Highest education level: Not on file  Occupational History   Not on file  Tobacco Use   Smoking status: Every Day    Current packs/day: 0.33    Types: Cigarettes   Smokeless tobacco: Never  Vaping Use   Vaping status: Never Used  Substance and Sexual Activity   Alcohol use: Yes    Comment: beer after work   Drug use: No   Sexual activity: Yes  Other Topics Concern   Not on file  Social History Narrative   Not on file   Social Drivers of Health   Financial Resource Strain: Low Risk  (10/05/2022)   Overall Financial Resource Strain (CARDIA)    Difficulty of Paying Living Expenses: Not hard at all  Food Insecurity: No Food Insecurity (10/05/2022)   Hunger Vital Sign     Worried About Running Out of Food in the Last Year: Never true    Ran Out of Food in the Last Year: Never true  Transportation Needs: No Transportation Needs (10/05/2022)   PRAPARE - Administrator, Civil Service (Medical): No    Lack of Transportation (Non-Medical): No  Physical Activity: Sufficiently Active (10/05/2022)   Exercise Vital Sign    Days of Exercise per Week: 7 days    Minutes of Exercise per Session: 40 min  Stress: No Stress Concern Present (04/07/2023)   [employer] of Occupational Health - Occupational Stress Questionnaire    Feeling of Stress : Not at all  Social Connections: Socially Isolated (10/05/2022)   Social Connection and Isolation Panel [NHANES]    Frequency of Communication with Friends and Family: More than three times a week    Frequency of Social Gatherings with Friends and Family: More than three times a week    Attends Religious Services: Never    Database administrator or Organizations: No    Attends Banker Meetings: Never    Marital Status: Divorced  Catering manager  Violence: Not At Risk (10/05/2022)   Humiliation, Afraid, Rape, and Kick questionnaire    Fear of Current or Ex-Partner: No    Emotionally Abused: No    Physically Abused: No    Sexually Abused: No    Review of Systems  All other systems reviewed and are negative.       Objective    BP 139/77   Pulse 76   Temp 97.8 F (36.6 C) (Oral)   Resp 16   Ht 6\' 2"  (1.88 m)   Wt 229 lb 14.4 oz (104.3 kg)   SpO2 97%   BMI 29.52 kg/m   Physical Exam Vitals and nursing note reviewed.  Constitutional:      General: He is not in acute distress. HENT:     Head: Normocephalic and atraumatic.     Right Ear: Tympanic membrane, ear canal and external ear normal.     Left Ear: Tympanic membrane, ear canal and external ear normal.     Nose: Nose normal.     Mouth/Throat:     Mouth: Mucous membranes are moist.     Pharynx: Oropharynx is clear.  Eyes:      Conjunctiva/sclera: Conjunctivae normal.     Pupils: Pupils are equal, round, and reactive to light.  Neck:     Thyroid: No thyromegaly.  Cardiovascular:     Rate and Rhythm: Normal rate and regular rhythm.     Heart sounds: Normal heart sounds. No murmur heard. Pulmonary:     Effort: Pulmonary effort is normal.     Breath sounds: Normal breath sounds.  Abdominal:     General: There is no distension.     Palpations: Abdomen is soft. There is no mass.     Tenderness: There is no abdominal tenderness.     Hernia: There is no hernia in the left inguinal area or right inguinal area.  Musculoskeletal:        General: Normal range of motion.     Cervical back: Normal range of motion and neck supple.     Right lower leg: No edema.     Left lower leg: No edema.  Skin:    General: Skin is warm and dry.  Neurological:     General: No focal deficit present.     Mental Status: He is alert and oriented to person, place, and time. Mental status is at baseline.  Psychiatric:        Mood and Affect: Mood normal.        Behavior: Behavior normal.         Assessment & Plan:   Annual physical exam -     CMP14+EGFR  Screening for deficiency anemia -     CBC with Differential/Platelet  Screening for lipid disorders -     Lipid panel     No follow-ups on file.   Tommie Raymond, MD

## 2023-10-07 LAB — CBC WITH DIFFERENTIAL/PLATELET
Basophils Absolute: 0.1 10*3/uL (ref 0.0–0.2)
Basos: 2 %
EOS (ABSOLUTE): 0.3 10*3/uL (ref 0.0–0.4)
Eos: 3 %
Hematocrit: 40.7 % (ref 37.5–51.0)
Hemoglobin: 13.7 g/dL (ref 13.0–17.7)
Immature Grans (Abs): 0.1 10*3/uL (ref 0.0–0.1)
Immature Granulocytes: 1 %
Lymphocytes Absolute: 2.6 10*3/uL (ref 0.7–3.1)
Lymphs: 30 %
MCH: 32.5 pg (ref 26.6–33.0)
MCHC: 33.7 g/dL (ref 31.5–35.7)
MCV: 97 fL (ref 79–97)
Monocytes Absolute: 0.8 10*3/uL (ref 0.1–0.9)
Monocytes: 9 %
Neutrophils Absolute: 4.7 10*3/uL (ref 1.4–7.0)
Neutrophils: 55 %
Platelets: 231 10*3/uL (ref 150–450)
RBC: 4.21 x10E6/uL (ref 4.14–5.80)
RDW: 12.5 % (ref 11.6–15.4)
WBC: 8.6 10*3/uL (ref 3.4–10.8)

## 2023-10-07 LAB — CMP14+EGFR
ALT: 19 IU/L (ref 0–44)
AST: 17 IU/L (ref 0–40)
Albumin: 4.3 g/dL (ref 3.8–4.9)
Alkaline Phosphatase: 73 IU/L (ref 44–121)
BUN/Creatinine Ratio: 19 (ref 9–20)
BUN: 16 mg/dL (ref 6–24)
Bilirubin Total: <0.2 mg/dL (ref 0.0–1.2)
CO2: 22 mmol/L (ref 20–29)
Calcium: 9.4 mg/dL (ref 8.7–10.2)
Chloride: 99 mmol/L (ref 96–106)
Creatinine, Ser: 0.86 mg/dL (ref 0.76–1.27)
Globulin, Total: 3.1 g/dL (ref 1.5–4.5)
Glucose: 96 mg/dL (ref 70–99)
Potassium: 4.2 mmol/L (ref 3.5–5.2)
Sodium: 136 mmol/L (ref 134–144)
Total Protein: 7.4 g/dL (ref 6.0–8.5)
eGFR: 100 mL/min/{1.73_m2} (ref >59)

## 2023-10-07 LAB — LIPID PANEL
Chol/HDL Ratio: 7.4 ratio — ABNORMAL HIGH (ref 0.0–5.0)
Cholesterol, Total: 267 mg/dL — ABNORMAL HIGH (ref 100–199)
HDL: 36 mg/dL — ABNORMAL LOW (ref >39)
LDL Chol Calc (NIH): 165 mg/dL — ABNORMAL HIGH (ref 0–99)
Triglycerides: 347 mg/dL — ABNORMAL HIGH (ref 0–149)
VLDL Cholesterol Cal: 66 mg/dL — ABNORMAL HIGH (ref 5–40)

## 2023-11-10 ENCOUNTER — Emergency Department (HOSPITAL_COMMUNITY)
Admission: EM | Admit: 2023-11-10 | Discharge: 2023-11-10 | Disposition: A | Discharge status: Home / Self Care | Attending: Emergency Medicine | Admitting: Emergency Medicine

## 2023-11-10 ENCOUNTER — Other Ambulatory Visit: Payer: Self-pay

## 2023-11-10 ENCOUNTER — Encounter (HOSPITAL_COMMUNITY): Payer: Self-pay | Admitting: *Deleted

## 2023-11-10 ENCOUNTER — Emergency Department (HOSPITAL_COMMUNITY)

## 2023-11-10 DIAGNOSIS — W228XXA Striking against or struck by other objects, initial encounter: Secondary | ICD-10-CM | POA: Diagnosis not present

## 2023-11-10 DIAGNOSIS — Z79899 Other long term (current) drug therapy: Secondary | ICD-10-CM | POA: Insufficient documentation

## 2023-11-10 DIAGNOSIS — F17210 Nicotine dependence, cigarettes, uncomplicated: Secondary | ICD-10-CM | POA: Insufficient documentation

## 2023-11-10 DIAGNOSIS — I10 Essential (primary) hypertension: Secondary | ICD-10-CM | POA: Diagnosis not present

## 2023-11-10 DIAGNOSIS — S90112A Contusion of left great toe without damage to nail, initial encounter: Secondary | ICD-10-CM | POA: Diagnosis not present

## 2023-11-10 DIAGNOSIS — S99922A Unspecified injury of left foot, initial encounter: Secondary | ICD-10-CM | POA: Diagnosis present

## 2023-11-10 LAB — BASIC METABOLIC PANEL WITH GFR
Anion gap: 12 (ref 5–15)
BUN: 12 mg/dL (ref 6–20)
CO2: 22 mmol/L (ref 22–32)
Calcium: 9.3 mg/dL (ref 8.9–10.3)
Chloride: 102 mmol/L (ref 98–111)
Creatinine, Ser: 0.81 mg/dL (ref 0.61–1.24)
GFR, Estimated: >60 mL/min (ref >60)
Glucose, Bld: 92 mg/dL (ref 70–99)
Potassium: 4.3 mmol/L (ref 3.5–5.1)
Sodium: 136 mmol/L (ref 135–145)

## 2023-11-10 LAB — CBC WITH DIFFERENTIAL/PLATELET
Abs Immature Granulocytes: 0.15 10*3/uL — ABNORMAL HIGH (ref 0.00–0.07)
Basophils Absolute: 0.1 10*3/uL (ref 0.0–0.1)
Basophils Relative: 2 %
Eosinophils Absolute: 0.3 10*3/uL (ref 0.0–0.5)
Eosinophils Relative: 3 %
HCT: 42.4 % (ref 39.0–52.0)
Hemoglobin: 13.8 g/dL (ref 13.0–17.0)
Immature Granulocytes: 2 %
Lymphocytes Relative: 26 %
Lymphs Abs: 2.3 10*3/uL (ref 0.7–4.0)
MCH: 32 pg (ref 26.0–34.0)
MCHC: 32.5 g/dL (ref 30.0–36.0)
MCV: 98.4 fL (ref 80.0–100.0)
Monocytes Absolute: 0.8 10*3/uL (ref 0.1–1.0)
Monocytes Relative: 9 %
Neutro Abs: 5.2 10*3/uL (ref 1.7–7.7)
Neutrophils Relative %: 58 %
Platelets: 228 10*3/uL (ref 150–400)
RBC: 4.31 MIL/uL (ref 4.22–5.81)
RDW: 13.5 % (ref 11.5–15.5)
WBC: 8.9 10*3/uL (ref 4.0–10.5)
nRBC: 0 % (ref 0.0–0.2)

## 2023-11-10 MED ORDER — OXYCODONE HCL 5 MG PO TABS: 5.0000 mg | ORAL_TABLET | ORAL | 0 refills | Status: DC | PRN

## 2023-11-10 MED ORDER — ACETAMINOPHEN 500 MG PO TABS
1000.0000 mg | ORAL_TABLET | Freq: Once | ORAL | Status: AC
  Administered 2023-11-10: 1000 mg via ORAL
  Filled 2023-11-10: qty 2

## 2023-11-10 MED ORDER — IBUPROFEN 600 MG PO TABS: 600.0000 mg | ORAL_TABLET | Freq: Four times a day (QID) | ORAL | 0 refills | Status: DC | PRN

## 2023-11-10 MED ORDER — OXYCODONE HCL 5 MG PO TABS
5.0000 mg | ORAL_TABLET | Freq: Once | ORAL | Status: AC
  Administered 2023-11-10: 5 mg via ORAL
  Filled 2023-11-10: qty 1

## 2023-11-10 MED ORDER — HYDROCODONE-ACETAMINOPHEN 5-325 MG PO TABS: 1.0000 | ORAL_TABLET | Freq: Once | ORAL | Status: DC

## 2023-11-10 MED ORDER — ACETAMINOPHEN 325 MG PO TABS: 650.0000 mg | ORAL_TABLET | Freq: Four times a day (QID) | ORAL | 0 refills | Status: DC | PRN

## 2023-11-10 NOTE — ED Triage Notes (Signed)
 Left foot pain.  Pt states that he thinks it began as an ingown toenail on his left great toe but has gotten worse.  Pt has purple at the tip of his great toe, he states that he struck his toe on the bed.  No fever or chills, some swelling noted to left foot.

## 2023-11-10 NOTE — Discharge Instructions (Signed)
 It was a pleasure caring for you today in the emergency department.  Please see pcp next week for recheck, return of symptoms worse   Please return to the emergency department for any worsening or worrisome symptoms.

## 2023-11-10 NOTE — ED Provider Notes (Addendum)
 Todd [name] Provider Note  CSN: [id number] Arrival date & time: 11/10/23 1217  Chief Complaint(s) Foot Pain  HPI Todd [name] is a 58 y.o. male with past medical history as below, significant for hypertension hyperlipidemia who presents to the ED with complaint of toe injury  Reports he had an ingrown toe to his left hallux, he kicked a wooden post and the pain severely worsened.  Difficulty walking secondary to the toe pain.  No some swelling to his foot and ankle on the left side.  No numbness or tingling to the affected foot.  No other injury reported, no blood thinners.  Past Medical History Past Medical History:  Diagnosis Date   Hyperlipidemia    Hypertension    Patient Active Problem List   Diagnosis Date Noted   Essential hypertension 05/07/2021   Home Medication(s) Prior to Admission medications   Medication Sig Start Date End Date Taking? Authorizing Provider  atorvastatin (LIPITOR) 20 MG tablet Take 1 tablet (20 mg total) by mouth daily. 10/08/22   Abraham Abo, MD  lisinopril (ZESTRIL) 20 MG tablet Take 1 tablet (20 mg total) by mouth daily. 04/07/23   Abraham Abo, MD                                                                                                                                    Past Surgical History Past Surgical History:  Procedure Laterality Date   HERNIA REPAIR     SHOULDER SURGERY Right    Family History Family History  Problem Relation Age of Onset   Cancer Mother    Cancer Father    Colon cancer Neg Hx    Colon polyps Neg Hx    Crohn's disease Neg Hx    Esophageal cancer Neg Hx    Rectal cancer Neg Hx    Stomach cancer Neg Hx    Ulcerative colitis Neg Hx     Social History Social History   Tobacco Use   Smoking status: Every Day    Current packs/day: 0.33    Types: Cigarettes   Smokeless tobacco: Never  Vaping Use   Vaping status: Never Used  Substance Use Topics    Alcohol use: Yes    Comment: beer after work   Drug use: No   Allergies Patient has no known allergies.  Review of Systems A thorough review of systems was obtained and all systems are negative except as noted in the HPI and PMH.   Physical Exam Vital Signs  I have reviewed the triage vital signs BP (!) 152/88   Pulse 72   Temp 98 F (36.7 C) (Oral)   Resp 18   SpO2 100%  Physical Exam Vitals and nursing note reviewed.  Constitutional:      General: He is not in acute distress.    Appearance: Normal appearance. He is well-developed. He is not ill-appearing.  HENT:     Head: Normocephalic and atraumatic.     Right Ear: External ear normal.     Left Ear: External ear normal.     Nose: Nose normal.     Mouth/Throat:     Mouth: Mucous membranes are moist.  Eyes:     General: No scleral icterus.       Right eye: No discharge.        Left eye: No discharge.  Cardiovascular:     Rate and Rhythm: Normal rate.  Pulmonary:     Effort: Pulmonary effort is normal. No respiratory distress.     Breath sounds: No stridor.  Abdominal:     General: Abdomen is flat. There is no distension.     Tenderness: There is no guarding.  Musculoskeletal:        General: No deformity.     Cervical back: No rigidity.       Feet:  Feet:     Comments: Le nvi  Skin:    General: Skin is warm and dry.     Coloration: Skin is not cyanotic, jaundiced or pale.  Neurological:     Mental Status: He is alert and oriented to person, place, and time.     GCS: GCS eye subscore is 4. GCS verbal subscore is 5. GCS motor subscore is 6.  Psychiatric:        Speech: Speech normal.        Behavior: Behavior normal. Behavior is cooperative.     ED Results and Treatments Labs (all labs ordered are listed, but only abnormal results are displayed) Labs Reviewed  CBC WITH DIFFERENTIAL/PLATELET - Abnormal; Notable for the following components:      Result Value   Abs Immature Granulocytes 0.15 (*)     All other components within normal limits  BASIC METABOLIC PANEL WITH GFR                                                                                                                          Radiology DG Foot Complete Left Result Date: 11/10/2023 CLINICAL DATA:  Discoloration and pain of the toes with the exception of the second toe. Ingrown toenail on the great toe. EXAM: LEFT FOOT - COMPLETE 3+ VIEW COMPARISON:  Overlapping portion from ankle radiographs dated 03/23/2015 FINDINGS: No bony destructive findings characteristic of osteomyelitis. Mild articular space narrowing likely reflection of mild osteoarthritis in the interphalangeal joints. Normal alignment. No gas in the soft tissues. No visible foreign body. Small Achilles calcaneal spur. IMPRESSION: 1. No bony destructive findings characteristic of osteomyelitis. 2. 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 3. Small Achilles calcaneal spur. Electronically Signed   By: Freida Jes M.D.   On: 11/10/2023 16:00    Pertinent labs & imaging results that were available during my care of the patient were reviewed by me and considered in my medical decision making (see MDM for details).  Medications Ordered in ED Medications  oxyCODONE (Oxy  IR/ROXICODONE) immediate release tablet 5 mg (has no administration in time range)  acetaminophen (TYLENOL) tablet 1,000 mg (has no administration in time range)                                                                                                                                     Procedures Procedures  (including critical care time)  Medical Decision Making / ED Course    Medical Decision Making:    Todd [name] is a 58 y.o. male with past medical history as below, significant for hypertension hyperlipidemia who presents to the ED with complaint of toe injury. The complaint involves an extensive differential diagnosis and also carries with it a high risk of  complications and morbidity.  Serious etiology was considered. Ddx includes but is not limited to: fracture, dislocation, contusion, hematoma, etc  Complete initial physical exam performed, notably the patient was in no distress.    Reviewed and confirmed nursing documentation for past medical history, family history, social history.  Vital signs reviewed.         Brief summary: 58 year old male with history above here with pain to his left foot.  Patient with ingrown toenail, kicked bedpost in pain greatly worsen, difficulty walking secondary to the pain.  Swelling noted to his toe and foot.  Sensation is intact.  NVI bilateral LE.  Labs obtained in triage are reassuring and x-ray is stable.  Improved analgesics here.  Recommend supportive care at home, follow-up PCP in the next week for repeat check to ensure improvement also get postop shoe.  Exam not c/w osteomyelitis or vascular compromise   Patient in no distress and overall condition improved here in the ED. Detailed discussions were had with the patient/guardian regarding current findings, and need for close f/u with PCP or on call doctor. The patient/guardian has been instructed to return immediately if the symptoms worsen in any way for re-evaluation. Patient/guardian verbalized understanding and is in agreement with current care plan. All questions answered prior to discharge.                 Additional history obtained: -Additional history obtained from family -External records from outside source obtained and reviewed including: Chart review including previous notes, labs, imaging, consultation notes including  Primary care documentation Home meds    Lab Tests: -I ordered, reviewed, and interpreted labs.   The pertinent results include:   Labs Reviewed  CBC WITH DIFFERENTIAL/PLATELET - Abnormal; Notable for the following components:      Result Value   Abs Immature Granulocytes 0.15 (*)    All other  components within normal limits  BASIC METABOLIC PANEL WITH GFR    Notable for   EKG   EKG Interpretation Date/Time:    Ventricular Rate:    PR Interval:    QRS Duration:    QT Interval:    QTC Calculation:   R Axis:  Text Interpretation:           Imaging Studies ordered: I ordered imaging studies including foot xr I independently visualized the following imaging with scope of interpretation limited to determining acute life threatening conditions related to emergency care; findings noted above I independently visualized and interpreted imaging. I agree with the radiologist interpretation   Medicines ordered and prescription drug management: Meds ordered this encounter  Medications   DISCONTD: HYDROcodone-acetaminophen (NORCO/VICODIN) 5-325 MG per tablet 1 tablet    Refill:  0   oxyCODONE (Oxy IR/ROXICODONE) immediate release tablet 5 mg    Refill:  0   acetaminophen (TYLENOL) tablet 1,000 mg    -I have reviewed the patients home medicines and have made adjustments as needed   Consultations Obtained: na   Cardiac Monitoring: Continuous pulse oximetry interpreted by myself, 100% on ra.    Social Determinants of Health:  Diagnosis or treatment significantly limited by social determinants of health: current smoker   Reevaluation: After the interventions noted above, I reevaluated the patient and found that they have improved  Co morbidities that complicate the patient evaluation  Past Medical History:  Diagnosis Date   Hyperlipidemia    Hypertension       Dispostion: Disposition decision including need for hospitalization was considered, and patient discharged from emergency department.    Final Clinical Impression(s) / ED Diagnoses Final diagnoses:  Contusion of left great toe without damage to nail, initial encounter        Teddi Favors, DO 11/10/23 2004    Teddi Favors, DO 11/10/23 2004

## 2023-11-10 NOTE — ED Provider Triage Note (Signed)
 Emergency Medicine Provider Triage Evaluation Note  Todd [name] , a 58 y.o. male  was evaluated in triage.  Pt complains of left great toe, 3rd, 4th and 5th toes turning blue and very painful. Has had history of bilateral pain in calves with ambulation for months, history of smoking. No dx PAD. Is a smoker, no DM2.  Review of Systems  Positive: Toe pain Negative: fever  Physical Exam  BP (!) 162/92   Pulse 77   Temp 97.8 F (36.6 C)   Resp 17   SpO2 100%  Gen:   Awake, no distress   Resp:  Normal effort  MSK:   Moves extremities without difficulty  Other:  Has good cap refill, extremely TTP of distal left toes, no open ulceration  Medical Decision Making  Medically screening exam initiated at 1:36 PM.  Appropriate orders placed.  Todd [name] was informed that the remainder of the evaluation will be completed by another provider, this initial triage assessment does not replace that evaluation, and the importance of remaining in the ED until their evaluation is complete.     Todd Heron, PA-C 11/10/23 1340

## 2023-11-28 ENCOUNTER — Ambulatory Visit (INDEPENDENT_AMBULATORY_CARE_PROVIDER_SITE_OTHER): Admitting: Family Medicine

## 2023-11-28 ENCOUNTER — Encounter: Payer: Self-pay | Admitting: Family Medicine

## 2023-11-28 VITALS — BP 166/91 | HR 68 | Wt 234.2 lb

## 2023-11-28 DIAGNOSIS — M79672 Pain in left foot: Secondary | ICD-10-CM

## 2023-11-28 DIAGNOSIS — S90112A Contusion of left great toe without damage to nail, initial encounter: Secondary | ICD-10-CM | POA: Insufficient documentation

## 2023-11-29 ENCOUNTER — Encounter: Payer: Self-pay | Admitting: Family Medicine

## 2023-11-29 NOTE — Progress Notes (Signed)
 Established Patient Office Visit  Subjective    Patient ID: Todd [name], male    DOB: 01-10-1966  Age: 58 y.o. MRN: [id number]  CC:  Chief Complaint  Patient presents with   Hospitalization Follow-up    HPI Todd [name] presents for left foot pain. Patient reports that he dropped something on his foot about 2-3 weeks ago. It appeared to be healing when he stubbed his toe on a piece of furniture. His toe is now very painful and discolored.   Outpatient Encounter Medications as of 11/28/2023  Medication Sig   atorvastatin  (LIPITOR) 20 MG tablet Take 1 tablet (20 mg total) by mouth daily.   ibuprofen  (ADVIL ) 600 MG tablet Take 1 tablet (600 mg total) by mouth every 6 (six) hours as needed.   lisinopril  (ZESTRIL ) 20 MG tablet Take 1 tablet (20 mg total) by mouth daily.   acetaminophen  (TYLENOL ) 325 MG tablet Take 2 tablets (650 mg total) by mouth every 6 (six) hours as needed.   oxyCODONE  (ROXICODONE ) 5 MG immediate release tablet Take 1 tablet (5 mg total) by mouth every 4 (four) hours as needed for severe pain (pain score 7-10).   No facility-administered encounter medications on file as of 11/28/2023.    Past Medical History:  Diagnosis Date   Hyperlipidemia    Hypertension     Past Surgical History:  Procedure Laterality Date   HERNIA REPAIR     SHOULDER SURGERY Right     Family History  Problem Relation Age of Onset   Cancer Mother    Cancer Father    Colon cancer Neg Hx    Colon polyps Neg Hx    Crohn's disease Neg Hx    Esophageal cancer Neg Hx    Rectal cancer Neg Hx    Stomach cancer Neg Hx    Ulcerative colitis Neg Hx     Social History   Socioeconomic History   Marital status: Divorced    Spouse name: Not on file   Number of children: Not on file   Years of education: Not on file   Highest education level: Not on file  Occupational History   Not on file  Tobacco Use   Smoking status: Every Day    Current packs/day: 0.33    Types: Cigarettes    Smokeless tobacco: Never  Vaping Use   Vaping status: Never Used  Substance and Sexual Activity   Alcohol use: Yes    Comment: beer after work   Drug use: No   Sexual activity: Yes  Other Topics Concern   Not on file  Social History Narrative   Not on file   Social Drivers of Health   Financial Resource Strain: Low Risk  (10/05/2022)   Overall Financial Resource Strain (CARDIA)    Difficulty of Paying Living Expenses: Not hard at all  Food Insecurity: No Food Insecurity (10/05/2022)   Hunger Vital Sign    Worried About Running Out of Food in the Last Year: Never true    Ran Out of Food in the Last Year: Never true  Transportation Needs: No Transportation Needs (10/05/2022)   PRAPARE - Administrator, Civil Service (Medical): No    Lack of Transportation (Non-Medical): No  Physical Activity: Sufficiently Active (10/05/2022)   Exercise Vital Sign    Days of Exercise per Week: 7 days    Minutes of Exercise per Session: 40 min  Stress: No Stress Concern Present (04/07/2023)   [employer]  of Occupational Health - Occupational Stress Questionnaire    Feeling of Stress : Not at all  Social Connections: Socially Isolated (10/05/2022)   Social Connection and Isolation Panel [NHANES]    Frequency of Communication with Friends and Family: More than three times a week    Frequency of Social Gatherings with Friends and Family: More than three times a week    Attends Religious Services: Never    Database administrator or Organizations: No    Attends Banker Meetings: Never    Marital Status: Divorced  Catering manager Violence: Not At Risk (10/05/2022)   Humiliation, Afraid, Rape, and Kick questionnaire    Fear of Current or Ex-Partner: No    Emotionally Abused: No    Physically Abused: No    Sexually Abused: No    Review of Systems  All other systems reviewed and are negative.       Objective    BP (!) 166/91 (BP Location: Right Arm, Patient  Position: Sitting, Cuff Size: Normal)   Pulse 68   Wt 234 lb 3.2 oz (106.2 kg)   SpO2 95%   BMI 30.07 kg/m   Physical Exam Vitals and nursing note reviewed.  Constitutional:      General: He is not in acute distress. Cardiovascular:     Rate and Rhythm: Normal rate and regular rhythm.  Pulmonary:     Effort: Pulmonary effort is normal.     Breath sounds: Normal breath sounds.  Musculoskeletal:     Comments: Left great toe  is discolored and exquisitely painful to palpation or movement  Neurological:     General: No focal deficit present.     Mental Status: He is alert and oriented to person, place, and time.         Assessment & Plan:   Left foot pain   Referral to ortho for further eval/mgt was declined as patient plans to go urgently to ortho UC.   Return if symptoms worsen or fail to improve.   Arlo Lama, MD

## 2023-12-13 DIAGNOSIS — S92423A Displaced fracture of distal phalanx of unspecified great toe, initial encounter for closed fracture: Secondary | ICD-10-CM | POA: Insufficient documentation

## 2024-01-13 DIAGNOSIS — L600 Ingrowing nail: Secondary | ICD-10-CM | POA: Insufficient documentation

## 2024-02-17 ENCOUNTER — Ambulatory Visit: Payer: Self-pay

## 2024-02-17 NOTE — Telephone Encounter (Signed)
 noted
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
## 2024-02-20 ENCOUNTER — Emergency Department (HOSPITAL_COMMUNITY): Admission: EM | Admit: 2024-02-20 | Discharge: 2024-02-20 | Discharge status: Against Medical Advice

## 2024-02-20 ENCOUNTER — Emergency Department (HOSPITAL_COMMUNITY)

## 2024-02-20 DIAGNOSIS — S2232XA Fracture of one rib, left side, initial encounter for closed fracture: Secondary | ICD-10-CM | POA: Diagnosis not present

## 2024-02-20 DIAGNOSIS — Z5329 Procedure and treatment not carried out because of patient's decision for other reasons: Secondary | ICD-10-CM | POA: Diagnosis not present

## 2024-02-20 DIAGNOSIS — I714 Abdominal aortic aneurysm, without rupture, unspecified: Secondary | ICD-10-CM | POA: Diagnosis not present

## 2024-02-20 DIAGNOSIS — Y92410 Unspecified street and highway as the place of occurrence of the external cause: Secondary | ICD-10-CM | POA: Insufficient documentation

## 2024-02-20 DIAGNOSIS — S2239XA Fracture of one rib, unspecified side, initial encounter for closed fracture: Secondary | ICD-10-CM

## 2024-02-20 DIAGNOSIS — R072 Precordial pain: Secondary | ICD-10-CM | POA: Diagnosis present

## 2024-02-20 DIAGNOSIS — S2222XA Fracture of body of sternum, initial encounter for closed fracture: Secondary | ICD-10-CM | POA: Insufficient documentation

## 2024-02-20 LAB — CBC
HCT: 42.2 % (ref 39.0–52.0)
Hemoglobin: 13.9 g/dL (ref 13.0–17.0)
MCH: 31.3 pg (ref 26.0–34.0)
MCHC: 32.9 g/dL (ref 30.0–36.0)
MCV: 95 fL (ref 80.0–100.0)
Platelets: 341 K/uL (ref 150–400)
RBC: 4.44 MIL/uL (ref 4.22–5.81)
RDW: 12.7 % (ref 11.5–15.5)
WBC: 10.5 K/uL (ref 4.0–10.5)
nRBC: 0 % (ref 0.0–0.2)

## 2024-02-20 LAB — COMPREHENSIVE METABOLIC PANEL WITH GFR
ALT: 26 U/L (ref 0–44)
AST: 23 U/L (ref 15–41)
Albumin: 4.1 g/dL (ref 3.5–5.0)
Alkaline Phosphatase: 71 U/L (ref 38–126)
Anion gap: 11 (ref 5–15)
BUN: 40 mg/dL — ABNORMAL HIGH (ref 6–20)
CO2: 21 mmol/L — ABNORMAL LOW (ref 22–32)
Calcium: 9.4 mg/dL (ref 8.9–10.3)
Chloride: 99 mmol/L (ref 98–111)
Creatinine, Ser: 1.31 mg/dL — ABNORMAL HIGH (ref 0.61–1.24)
GFR, Estimated: >60 mL/min (ref >60)
Glucose, Bld: 108 mg/dL — ABNORMAL HIGH (ref 70–99)
Potassium: 5.2 mmol/L — ABNORMAL HIGH (ref 3.5–5.1)
Sodium: 131 mmol/L — ABNORMAL LOW (ref 135–145)
Total Bilirubin: 0.7 mg/dL (ref 0.0–1.2)
Total Protein: 8.5 g/dL — ABNORMAL HIGH (ref 6.5–8.1)

## 2024-02-20 LAB — TROPONIN I (HIGH SENSITIVITY): Troponin I (High Sensitivity): 4 ng/L (ref <18)

## 2024-02-20 MED ORDER — IOHEXOL 300 MG/ML  SOLN
100.0000 mL | Freq: Once | INTRAMUSCULAR | Status: AC | PRN
  Administered 2024-02-20: 100 mL via INTRAVENOUS

## 2024-02-20 MED ORDER — SODIUM CHLORIDE 0.9 % IV BOLUS
500.0000 mL | Freq: Once | INTRAVENOUS | Status: AC
  Administered 2024-02-20: 500 mL via INTRAVENOUS
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
## 2024-02-20 NOTE — ED Notes (Signed)
 Patient left AMA signed formed stating he has to get back to work and his family. He was A&O at time of departure denied pain or discomfort. MD aware.

## 2024-02-27 ENCOUNTER — Emergency Department (HOSPITAL_COMMUNITY)

## 2024-02-27 ENCOUNTER — Other Ambulatory Visit: Payer: Self-pay

## 2024-02-27 ENCOUNTER — Inpatient Hospital Stay (HOSPITAL_COMMUNITY)
Admission: EM | Admit: 2024-02-27 | Discharge: 2024-03-10 | DRG: 035 | Disposition: A | Discharge status: Home Health | Attending: Student | Admitting: Student

## 2024-02-27 ENCOUNTER — Inpatient Hospital Stay (HOSPITAL_COMMUNITY)

## 2024-02-27 DIAGNOSIS — M4807 Spinal stenosis, lumbosacral region: Secondary | ICD-10-CM | POA: Diagnosis present

## 2024-02-27 DIAGNOSIS — D62 Acute posthemorrhagic anemia: Secondary | ICD-10-CM | POA: Diagnosis not present

## 2024-02-27 DIAGNOSIS — I701 Atherosclerosis of renal artery: Secondary | ICD-10-CM | POA: Diagnosis present

## 2024-02-27 DIAGNOSIS — I70222 Atherosclerosis of native arteries of extremities with rest pain, left leg: Secondary | ICD-10-CM | POA: Diagnosis not present

## 2024-02-27 DIAGNOSIS — E8721 Acute metabolic acidosis: Secondary | ICD-10-CM | POA: Diagnosis present

## 2024-02-27 DIAGNOSIS — Z6829 Body mass index (BMI) 29.0-29.9, adult: Secondary | ICD-10-CM

## 2024-02-27 DIAGNOSIS — I6523 Occlusion and stenosis of bilateral carotid arteries: Secondary | ICD-10-CM | POA: Diagnosis present

## 2024-02-27 DIAGNOSIS — T402X5A Adverse effect of other opioids, initial encounter: Secondary | ICD-10-CM | POA: Diagnosis present

## 2024-02-27 DIAGNOSIS — G9389 Other specified disorders of brain: Secondary | ICD-10-CM | POA: Diagnosis present

## 2024-02-27 DIAGNOSIS — E663 Overweight: Secondary | ICD-10-CM | POA: Diagnosis present

## 2024-02-27 DIAGNOSIS — I639 Cerebral infarction, unspecified: Secondary | ICD-10-CM | POA: Diagnosis present

## 2024-02-27 DIAGNOSIS — R29703 NIHSS score 3: Secondary | ICD-10-CM | POA: Diagnosis present

## 2024-02-27 DIAGNOSIS — L97529 Non-pressure chronic ulcer of other part of left foot with unspecified severity: Secondary | ICD-10-CM | POA: Diagnosis present

## 2024-02-27 DIAGNOSIS — R278 Other lack of coordination: Secondary | ICD-10-CM | POA: Diagnosis present

## 2024-02-27 DIAGNOSIS — R29707 NIHSS score 7: Secondary | ICD-10-CM | POA: Diagnosis not present

## 2024-02-27 DIAGNOSIS — R918 Other nonspecific abnormal finding of lung field: Secondary | ICD-10-CM | POA: Diagnosis present

## 2024-02-27 DIAGNOSIS — S2231XD Fracture of one rib, right side, subsequent encounter for fracture with routine healing: Secondary | ICD-10-CM

## 2024-02-27 DIAGNOSIS — R29810 Facial weakness: Secondary | ICD-10-CM | POA: Diagnosis present

## 2024-02-27 DIAGNOSIS — R531 Weakness: Secondary | ICD-10-CM | POA: Diagnosis not present

## 2024-02-27 DIAGNOSIS — E871 Hypo-osmolality and hyponatremia: Secondary | ICD-10-CM | POA: Diagnosis present

## 2024-02-27 DIAGNOSIS — I6389 Other cerebral infarction: Secondary | ICD-10-CM | POA: Diagnosis not present

## 2024-02-27 DIAGNOSIS — E785 Hyperlipidemia, unspecified: Secondary | ICD-10-CM | POA: Diagnosis present

## 2024-02-27 DIAGNOSIS — I70262 Atherosclerosis of native arteries of extremities with gangrene, left leg: Secondary | ICD-10-CM | POA: Diagnosis present

## 2024-02-27 DIAGNOSIS — R911 Solitary pulmonary nodule: Secondary | ICD-10-CM | POA: Insufficient documentation

## 2024-02-27 DIAGNOSIS — F17200 Nicotine dependence, unspecified, uncomplicated: Secondary | ICD-10-CM | POA: Insufficient documentation

## 2024-02-27 DIAGNOSIS — I10 Essential (primary) hypertension: Secondary | ICD-10-CM | POA: Diagnosis present

## 2024-02-27 DIAGNOSIS — Z809 Family history of malignant neoplasm, unspecified: Secondary | ICD-10-CM

## 2024-02-27 DIAGNOSIS — M4854XA Collapsed vertebra, not elsewhere classified, thoracic region, initial encounter for fracture: Secondary | ICD-10-CM | POA: Diagnosis present

## 2024-02-27 DIAGNOSIS — Z8673 Personal history of transient ischemic attack (TIA), and cerebral infarction without residual deficits: Secondary | ICD-10-CM

## 2024-02-27 DIAGNOSIS — Z006 Encounter for examination for normal comparison and control in clinical research program: Secondary | ICD-10-CM | POA: Diagnosis not present

## 2024-02-27 DIAGNOSIS — Z79899 Other long term (current) drug therapy: Secondary | ICD-10-CM

## 2024-02-27 DIAGNOSIS — I63512 Cerebral infarction due to unspecified occlusion or stenosis of left middle cerebral artery: Principal | ICD-10-CM | POA: Diagnosis present

## 2024-02-27 DIAGNOSIS — I7143 Infrarenal abdominal aortic aneurysm, without rupture: Principal | ICD-10-CM | POA: Diagnosis present

## 2024-02-27 DIAGNOSIS — R471 Dysarthria and anarthria: Secondary | ICD-10-CM | POA: Diagnosis present

## 2024-02-27 DIAGNOSIS — K5903 Drug induced constipation: Secondary | ICD-10-CM | POA: Diagnosis present

## 2024-02-27 DIAGNOSIS — I739 Peripheral vascular disease, unspecified: Secondary | ICD-10-CM | POA: Insufficient documentation

## 2024-02-27 DIAGNOSIS — I771 Stricture of artery: Secondary | ICD-10-CM | POA: Diagnosis present

## 2024-02-27 DIAGNOSIS — I513 Intracardiac thrombosis, not elsewhere classified: Secondary | ICD-10-CM | POA: Diagnosis present

## 2024-02-27 DIAGNOSIS — Z716 Tobacco abuse counseling: Secondary | ICD-10-CM

## 2024-02-27 DIAGNOSIS — I7409 Other arterial embolism and thrombosis of abdominal aorta: Secondary | ICD-10-CM | POA: Diagnosis present

## 2024-02-27 DIAGNOSIS — S2222XD Fracture of body of sternum, subsequent encounter for fracture with routine healing: Secondary | ICD-10-CM

## 2024-02-27 DIAGNOSIS — I714 Abdominal aortic aneurysm, without rupture, unspecified: Secondary | ICD-10-CM | POA: Diagnosis not present

## 2024-02-27 DIAGNOSIS — S2243XD Multiple fractures of ribs, bilateral, subsequent encounter for fracture with routine healing: Secondary | ICD-10-CM

## 2024-02-27 DIAGNOSIS — I70209 Unspecified atherosclerosis of native arteries of extremities, unspecified extremity: Secondary | ICD-10-CM | POA: Diagnosis not present

## 2024-02-27 DIAGNOSIS — I7090 Unspecified atherosclerosis: Secondary | ICD-10-CM | POA: Diagnosis not present

## 2024-02-27 DIAGNOSIS — Z7902 Long term (current) use of antithrombotics/antiplatelets: Secondary | ICD-10-CM

## 2024-02-27 DIAGNOSIS — I70245 Atherosclerosis of native arteries of left leg with ulceration of other part of foot: Secondary | ICD-10-CM | POA: Diagnosis not present

## 2024-02-27 DIAGNOSIS — Z5329 Procedure and treatment not carried out because of patient's decision for other reasons: Secondary | ICD-10-CM | POA: Diagnosis not present

## 2024-02-27 DIAGNOSIS — F17210 Nicotine dependence, cigarettes, uncomplicated: Secondary | ICD-10-CM | POA: Diagnosis not present

## 2024-02-27 DIAGNOSIS — E861 Hypovolemia: Secondary | ICD-10-CM | POA: Diagnosis present

## 2024-02-27 DIAGNOSIS — G8191 Hemiplegia, unspecified affecting right dominant side: Secondary | ICD-10-CM | POA: Diagnosis present

## 2024-02-27 DIAGNOSIS — I63232 Cerebral infarction due to unspecified occlusion or stenosis of left carotid arteries: Secondary | ICD-10-CM | POA: Diagnosis not present

## 2024-02-27 DIAGNOSIS — F419 Anxiety disorder, unspecified: Secondary | ICD-10-CM | POA: Diagnosis present

## 2024-02-27 DIAGNOSIS — S2232XD Fracture of one rib, left side, subsequent encounter for fracture with routine healing: Secondary | ICD-10-CM

## 2024-02-27 DIAGNOSIS — Z7982 Long term (current) use of aspirin: Secondary | ICD-10-CM

## 2024-02-27 DIAGNOSIS — Z7401 Bed confinement status: Secondary | ICD-10-CM | POA: Diagnosis not present

## 2024-02-27 DIAGNOSIS — R2681 Unsteadiness on feet: Secondary | ICD-10-CM | POA: Diagnosis present

## 2024-02-27 DIAGNOSIS — Z95820 Peripheral vascular angioplasty status with implants and grafts: Secondary | ICD-10-CM | POA: Diagnosis not present

## 2024-02-27 DIAGNOSIS — I700 Atherosclerosis of aorta: Secondary | ICD-10-CM | POA: Diagnosis not present

## 2024-02-27 DIAGNOSIS — I7777 Dissection of artery of lower extremity: Secondary | ICD-10-CM | POA: Diagnosis not present

## 2024-02-27 DIAGNOSIS — R4781 Slurred speech: Secondary | ICD-10-CM | POA: Diagnosis not present

## 2024-02-27 DIAGNOSIS — I779 Disorder of arteries and arterioles, unspecified: Secondary | ICD-10-CM | POA: Insufficient documentation

## 2024-02-27 DIAGNOSIS — I6521 Occlusion and stenosis of right carotid artery: Secondary | ICD-10-CM | POA: Diagnosis not present

## 2024-02-27 DIAGNOSIS — S2220XD Unspecified fracture of sternum, subsequent encounter for fracture with routine healing: Secondary | ICD-10-CM

## 2024-02-27 LAB — COMPREHENSIVE METABOLIC PANEL WITH GFR
ALT: 32 U/L (ref 0–44)
AST: 44 U/L — ABNORMAL HIGH (ref 15–41)
Albumin: 4.4 g/dL (ref 3.5–5.0)
Alkaline Phosphatase: 109 U/L (ref 38–126)
Anion gap: 12 (ref 5–15)
BUN: 33 mg/dL — ABNORMAL HIGH (ref 6–20)
CO2: 21 mmol/L — ABNORMAL LOW (ref 22–32)
Calcium: 9.7 mg/dL (ref 8.9–10.3)
Chloride: 94 mmol/L — ABNORMAL LOW (ref 98–111)
Creatinine, Ser: 1.25 mg/dL — ABNORMAL HIGH (ref 0.61–1.24)
GFR, Estimated: >60 mL/min (ref >60)
Glucose, Bld: 103 mg/dL — ABNORMAL HIGH (ref 70–99)
Potassium: 4.3 mmol/L (ref 3.5–5.1)
Sodium: 127 mmol/L — ABNORMAL LOW (ref 135–145)
Total Bilirubin: 0.6 mg/dL (ref 0.0–1.2)
Total Protein: 9.2 g/dL — ABNORMAL HIGH (ref 6.5–8.1)

## 2024-02-27 LAB — DIFFERENTIAL
Abs Immature Granulocytes: 0.12 K/uL — ABNORMAL HIGH (ref 0.00–0.07)
Basophils Absolute: 0.1 K/uL (ref 0.0–0.1)
Basophils Relative: 1 %
Eosinophils Absolute: 0 K/uL (ref 0.0–0.5)
Eosinophils Relative: 0 %
Immature Granulocytes: 1 %
Lymphocytes Relative: 17 %
Lymphs Abs: 1.8 K/uL (ref 0.7–4.0)
Monocytes Absolute: 0.9 K/uL (ref 0.1–1.0)
Monocytes Relative: 9 %
Neutro Abs: 7.7 K/uL (ref 1.7–7.7)
Neutrophils Relative %: 72 %

## 2024-02-27 LAB — CBC
HCT: 42 % (ref 39.0–52.0)
Hemoglobin: 14 g/dL (ref 13.0–17.0)
MCH: 31.6 pg (ref 26.0–34.0)
MCHC: 33.3 g/dL (ref 30.0–36.0)
MCV: 94.8 fL (ref 80.0–100.0)
Platelets: 310 K/uL (ref 150–400)
RBC: 4.43 MIL/uL (ref 4.22–5.81)
RDW: 12.7 % (ref 11.5–15.5)
WBC: 10.6 K/uL — ABNORMAL HIGH (ref 4.0–10.5)
nRBC: 0 % (ref 0.0–0.2)

## 2024-02-27 LAB — PROTIME-INR
INR: 1.4 — ABNORMAL HIGH (ref 0.8–1.2)
Prothrombin Time: 17.8 s — ABNORMAL HIGH (ref 11.4–15.2)

## 2024-02-27 LAB — CBG MONITORING, ED: Glucose-Capillary: 108 mg/dL — ABNORMAL HIGH (ref 70–99)

## 2024-02-27 LAB — ETHANOL: Alcohol, Ethyl (B): <15 mg/dL (ref <15)

## 2024-02-27 LAB — APTT: aPTT: 31 s (ref 24–36)

## 2024-02-27 MED ORDER — ACETAMINOPHEN 325 MG PO TABS
650.0000 mg | ORAL_TABLET | Freq: Four times a day (QID) | ORAL | Status: DC | PRN
  Administered 2024-02-28 – 2024-03-05 (×11): 650 mg via ORAL
  Filled 2024-02-27 (×12): qty 2

## 2024-02-27 MED ORDER — CLOPIDOGREL BISULFATE 300 MG PO TABS
300.0000 mg | ORAL_TABLET | Freq: Once | ORAL | Status: AC
  Administered 2024-02-27: 300 mg via ORAL
  Filled 2024-02-27: qty 1

## 2024-02-27 MED ORDER — CLOPIDOGREL BISULFATE 75 MG PO TABS
75.0000 mg | ORAL_TABLET | Freq: Every day | ORAL | Status: DC
  Administered 2024-02-28 – 2024-03-10 (×15): 75 mg via ORAL
  Filled 2024-02-27 (×12): qty 1

## 2024-02-27 MED ORDER — ORAL CARE MOUTH RINSE: 15.0000 mL | OROMUCOSAL | Status: DC | PRN

## 2024-02-27 MED ORDER — ALBUTEROL SULFATE (2.5 MG/3ML) 0.083% IN NEBU: 2.5000 mg | INHALATION_SOLUTION | RESPIRATORY_TRACT | Status: DC | PRN

## 2024-02-27 MED ORDER — OXYCODONE HCL 5 MG PO TABS
2.5000 mg | ORAL_TABLET | Freq: Once | ORAL | Status: AC
  Administered 2024-02-27: 2.5 mg via ORAL
  Filled 2024-02-27: qty 1

## 2024-02-27 MED ORDER — ENOXAPARIN SODIUM 40 MG/0.4ML IJ SOSY
40.0000 mg | PREFILLED_SYRINGE | INTRAMUSCULAR | Status: DC
  Administered 2024-02-27 – 2024-03-01 (×4): 40 mg via SUBCUTANEOUS
  Filled 2024-02-27 (×4): qty 0.4

## 2024-02-27 MED ORDER — ASPIRIN 325 MG PO TBEC
650.0000 mg | DELAYED_RELEASE_TABLET | Freq: Once | ORAL | Status: AC
  Administered 2024-02-27: 650 mg via ORAL
  Filled 2024-02-27: qty 2

## 2024-02-27 MED ORDER — ACETAMINOPHEN 325 MG PO TABS
650.0000 mg | ORAL_TABLET | Freq: Once | ORAL | Status: AC
  Administered 2024-02-27: 650 mg via ORAL
  Filled 2024-02-27: qty 2

## 2024-02-27 MED ORDER — ACETAMINOPHEN 650 MG RE SUPP: 650.0000 mg | Freq: Four times a day (QID) | RECTAL | Status: DC | PRN

## 2024-02-27 MED ORDER — ONDANSETRON HCL 4 MG/2ML IJ SOLN
4.0000 mg | Freq: Four times a day (QID) | INTRAMUSCULAR | Status: DC | PRN
  Administered 2024-03-06 (×2): 4 mg via INTRAVENOUS

## 2024-02-27 MED ORDER — ATORVASTATIN CALCIUM 10 MG PO TABS
20.0000 mg | ORAL_TABLET | Freq: Every day | ORAL | Status: DC
Start: 2024-02-28 — End: 2024-02-28

## 2024-02-27 MED ORDER — STROKE: EARLY STAGES OF RECOVERY BOOK
Freq: Once | Status: AC
  Filled 2024-02-27: qty 1

## 2024-02-27 MED ORDER — IOHEXOL 350 MG/ML SOLN
75.0000 mL | Freq: Once | INTRAVENOUS | Status: AC | PRN
  Administered 2024-02-27: 75 mL via INTRAVENOUS

## 2024-02-27 MED ORDER — ASPIRIN 81 MG PO TBEC
81.0000 mg | DELAYED_RELEASE_TABLET | Freq: Every day | ORAL | Status: DC
  Administered 2024-02-28 – 2024-03-10 (×15): 81 mg via ORAL
  Filled 2024-02-27 (×12): qty 1

## 2024-02-27 MED ORDER — ONDANSETRON HCL 4 MG PO TABS: 4.0000 mg | ORAL_TABLET | Freq: Four times a day (QID) | ORAL | Status: DC | PRN

## 2024-02-27 NOTE — ED Notes (Signed)
 Patient transported to CT

## 2024-02-27 NOTE — H&P (Signed)
 History and Physical  Todd [name] FMW:[id number] DOB: 01/28/1966 DOA: 02/27/2024  PCP: Tanda Bleacher, MD   Chief Complaint: Right-sided weakness, slurred speech  HPI: Todd [name] is a 58 y.o. male with medical history significant for hypertension, hyperlipidemia being admitted to the hospital at [facility] with concern for acute CVA.  Patient states he was in his usual state of health until noon today, when he had acute onset of right-sided weakness, drooling out of the right side of his mouth and slurred speech.  He presented to the emergency department [facility] about 2 hours and 15 minutes after symptoms started, he was a code stroke.  Initial CT scan without acute findings, he has persistence of his symptoms and was seen by teleneurologist Dr. Lindzen.  He is considered a candidate for thrombolytics, but after discussion of risks and benefits with neurology, the patient declined thrombolytic treatment.  He denies any recent illness, fevers, chills, denies any extremity weakness numbness tingling, imbalance.  He states that his speech is now starting to clear up a little bit.  Review of Systems: Please see HPI for pertinent positives and negatives. A complete 10 system review of systems are otherwise negative.  Past Medical History:  Diagnosis Date   Hyperlipidemia    Hypertension    Past Surgical History:  Procedure Laterality Date   HERNIA REPAIR     SHOULDER SURGERY Right    Social History:  reports that he has been smoking cigarettes. He has never used smokeless tobacco. He reports current alcohol use. He reports that he does not use drugs.  No Known Allergies  Family History  Problem Relation Age of Onset   Cancer Mother    Cancer Father    Colon cancer Neg Hx    Colon polyps Neg Hx    Crohn's disease Neg Hx    Esophageal cancer Neg Hx    Rectal cancer Neg Hx    Stomach cancer Neg Hx    Ulcerative colitis Neg Hx      Prior to Admission medications   Medication Sig Start Date End  Date Taking? Authorizing Provider  atorvastatin  (LIPITOR) 20 MG tablet Take 1 tablet (20 mg total) by mouth daily. 10/08/22  Yes Tanda Bleacher, MD  lisinopril  (ZESTRIL ) 20 MG tablet Take 1 tablet (20 mg total) by mouth daily. 04/07/23  Yes Tanda Bleacher, MD  traMADol (ULTRAM) 50 MG tablet Take 50 mg by mouth every 6 (six) hours as needed for moderate pain (pain score 4-6). Patient not taking: Reported on 02/27/2024 02/10/24   [provider]    Physical Exam: BP 125/85   Pulse 98   Temp 97.8 F (36.6 C)   Resp 16   Ht 6' 2 (1.88 m)   Wt 104.3 kg   SpO2 96%   BMI 29.53 kg/m  General:  Alert, oriented, calm, in no acute distress, his wife is at the bedside and is hearing impaired Cardiovascular: RRR, no murmurs or rubs, no peripheral edema  Respiratory: clear to auscultation bilaterally, no wheezes, no crackles  Abdomen: soft, nontender, nondistended, normal bowel tones heard  Skin: dry, no rashes  Musculoskeletal: no joint effusions, normal range of motion  Psychiatric: appropriate affect, normal speech  Neurologic: extraocular muscles intact, speech is slurred, he has no facial droop, extremity sensorium is intact, with equal bilateral normal strength and sensation         Labs on Admission:  Basic Metabolic Panel: Recent Labs  Lab 02/27/24 1426  NA 127*  K 4.3  CL 94*  CO2 21*  GLUCOSE 103*  BUN 33*  CREATININE 1.25*  CALCIUM  9.7   Liver Function Tests: Recent Labs  Lab 02/27/24 1426  AST 44*  ALT 32  ALKPHOS 109  BILITOT 0.6  PROT 9.2*  ALBUMIN 4.4   No results for input(s): LIPASE, AMYLASE in the last 168 hours. No results for input(s): AMMONIA in the last 168 hours. CBC: Recent Labs  Lab 02/27/24 1426  WBC 10.6*  NEUTROABS 7.7  HGB 14.0  HCT 42.0  MCV 94.8  PLT 310   Cardiac Enzymes: No results for input(s): CKTOTAL, CKMB, CKMBINDEX, TROPONINI in the last 168 hours. BNP (last 3 results) No results for input(s): BNP in the  last 8760 hours.  ProBNP (last 3 results) No results for input(s): PROBNP in the last 8760 hours.  CBG: Recent Labs  Lab 02/27/24 1413  GLUCAP 108*    Radiological Exams on Admission: CT ANGIO HEAD NECK W WO CM (CODE STROKE) Result Date: 02/27/2024 EXAM: CTA HEAD AND NECK WITHOUT AND WITH 02/27/2024 02:46:37 PM TECHNIQUE: CTA of the head and neck was performed with and without the administration of intravenous contrast. Multiplanar 2D and/or 3D reformatted images are provided for review. Automated exposure control, iterative reconstruction, and/or weight based adjustment of the mA/kV was utilized to reduce the radiation dose to as low as reasonably achievable. Stenosis of the internal carotid arteries measured using NASCET criteria. COMPARISON: None available CLINICAL HISTORY: Neuro deficit, acute, stroke suspected; Weakness right side- facial droop. FINDINGS: CTA NECK: AORTIC ARCH AND ARCH VESSELS: Calcific plaque present within the aortic arch and within the origins of the brachiocephalic artery, left common carotid and left subclavian arteries. CERVICAL CAROTID ARTERIES: Moderate calcific plaque within the right carotid bulb and origin of the right internal carotid artery with luminal irregularity and approximately 50% luminal stenosis. The remainder of the cervical segment is normal in caliber. Moderate calcific and uncalcific plaque within the origin of the left internal carotid artery with approximately 50 to 60% luminal stenosis. CERVICAL VERTEBRAL ARTERIES: The vertebral arteries are patent. The left vertebral artery is dominant. LUNGS AND MEDIASTINUM: The lung apices are mildly emphysematous. SOFT TISSUES: No acute abnormality. BONES: No acute abnormality. CTA HEAD: ANTERIOR CIRCULATION: Moderate calcific plaque present within the petrous and cavernous segments of the internal carotid arteries bilaterally. Estimated stenosis of the cavernous segments is 40 to 50%. The supraclinoid segments  are patent. The anterior and middle cerebral arteries and the proximal branches appear normal in caliber. POSTERIOR CIRCULATION: The vertebral basilar system is unremarkable. There is a right posterior communicating artery. The left posterior communicating artery is either diminutive or absent. The posterior cerebral arteries are normal in caliber. OTHER: No dural venous sinus thrombosis on this non-dedicated study. The above findings were communicated with the ordering clinician, Dr. Gloria, at 02:59 pm Feb 26 2001 via the [facility] paging. IMPRESSION: 1. Moderate calcific plaque within the right carotid bulb and origin of the right internal carotid artery with luminal irregularity and approximately 50% luminal stenosis. 2. Moderate calcific and uncalcific plaque within the origin of the left internal carotid artery with approximately 50 to 60% luminal stenosis. 3. Moderate calcific plaque within the petrous and cavernous segments of the internal carotid arteries bilaterally, with estimated stenosis of the cavernous segments of 40 to 50%. 4. Findings communicated with the ordering clinician, Dr. Merrianne, at 02:59 PM on 02/27/2024 via the [facility] paging system. Electronically signed by: evalene coho 02/27/2024 03:01 PM EDT RP  Workstation: [id number]   CT HEAD CODE STROKE WO CONTRAST Result Date: 02/27/2024 EXAM: CT HEAD WITHOUT CONTRAST 02/27/2024 02:36:32 PM TECHNIQUE: CT of the head was performed without the administration of intravenous contrast. Automated exposure control, iterative reconstruction, and/or weight based adjustment of the mA/kV was utilized to reduce the radiation dose to as low as reasonably achievable. COMPARISON: CT of the head dated 02/20/2024. CLINICAL HISTORY: Neuro deficit, acute, stroke suspected. Weakness- right side. Facial droop. Last normal 12P. Dr. Lindzen [phone]. FINDINGS: BRAIN AND VENTRICLES: No acute hemorrhage. Gray-white differentiation is preserved. No hydrocephalus. No  extra-axial collection. No mass effect or midline shift. Chronic encephalomalacia changes in the left occipital lobe. Chronic lacunar infarct is present within the right basal ganglia. ASPECT score is 10. ORBITS: No acute abnormality. SINUSES: No acute abnormality. SOFT TISSUES AND SKULL: No acute soft tissue abnormality. No skull fracture. The above findings were communicated to Dr. Lindzen at 02:41 PM on 02/27/2024 via the [facility] paging service. IMPRESSION: 1. No acute intracranial abnormality. 2. Chronic encephalomalacia changes in the left occipital lobe and chronic lacunar infarct in the right basal ganglia. Electronically signed by: evalene coho 02/27/2024 02:44 PM EDT RP Workstation: [id number]   Assessment/Plan Todd [name] is a 58 y.o. male with medical history significant for hypertension, hyperlipidemia being admitted to the hospital at [facility] with concern for acute CVA.    Acute CVA-with sudden onset of right-sided facial droop, slurred speech.  Initial head CT with no acute intracranial abnormality.  Patient declined thrombolytics. -Inpatient admission to [facility] -Telemetry monitoring -Hold antihypertensives for the next 24 hours -N.p.o. until bedside swallow screen -PT/OT, and SLP if indicated -Check fasting lipids, hemoglobin A1c -Check MRI brain without contrast -Check TTE -Received ASA and Plavix  load in the ER, plan to start ASA 81 mg and Plavix  75 mg p.o. daily x 21 days tomorrow  Hyperlipidemia-continue home statin, check fasting lipids  Hypertension-holding home lisinopril  for the time being  DVT prophylaxis: Lovenox      Code Status: Full Code  Consults called: Neurology  Admission status: The appropriate patient status for this patient is INPATIENT. Inpatient status is judged to be reasonable and necessary in order to provide the required intensity of service to ensure the patient's safety. The patient's presenting symptoms, physical exam findings, and initial radiographic  and laboratory data in the context of their chronic comorbidities is felt to place them at high risk for further clinical deterioration. Furthermore, it is not anticipated that the patient will be medically stable for discharge from the hospital within 2 midnights of admission.    I certify that at the point of admission it is my clinical judgment that the patient will require inpatient hospital care spanning beyond 2 midnights from the point of admission due to high intensity of service, high risk for further deterioration and high frequency of surveillance required  Time spent: 53 minutes  Todd Hiley HERO Gail MD [facility] Pager [phone]  If 7PM-7AM, please contact night-coverage [email or web address] Password [facility]  02/27/2024, 4:36 PM

## 2024-02-27 NOTE — ED Triage Notes (Signed)
 Patient to ED by POV with right side weakness that started 2 hours ago. He reports trying to drink and not being able to hold anything in his hand and feeling weak to his right side. His last know well was around noon today. CODE STROKE activated per ED MD.

## 2024-02-27 NOTE — Progress Notes (Signed)
 VAST RN responded to Code Stroke page. Upon arrival, no  further IV access needed.

## 2024-02-27 NOTE — ED Notes (Signed)
 CODE STROKE CALLER. KLJ
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
## 2024-02-28 ENCOUNTER — Inpatient Hospital Stay (HOSPITAL_COMMUNITY)

## 2024-02-28 ENCOUNTER — Encounter (HOSPITAL_COMMUNITY)

## 2024-02-28 DIAGNOSIS — I7090 Unspecified atherosclerosis: Secondary | ICD-10-CM

## 2024-02-28 DIAGNOSIS — I6389 Other cerebral infarction: Secondary | ICD-10-CM

## 2024-02-28 DIAGNOSIS — I639 Cerebral infarction, unspecified: Secondary | ICD-10-CM | POA: Diagnosis not present

## 2024-02-28 DIAGNOSIS — F17210 Nicotine dependence, cigarettes, uncomplicated: Secondary | ICD-10-CM

## 2024-02-28 DIAGNOSIS — R911 Solitary pulmonary nodule: Secondary | ICD-10-CM | POA: Insufficient documentation

## 2024-02-28 DIAGNOSIS — L97529 Non-pressure chronic ulcer of other part of left foot with unspecified severity: Secondary | ICD-10-CM | POA: Diagnosis not present

## 2024-02-28 DIAGNOSIS — I70245 Atherosclerosis of native arteries of left leg with ulceration of other part of foot: Secondary | ICD-10-CM | POA: Diagnosis not present

## 2024-02-28 DIAGNOSIS — I7143 Infrarenal abdominal aortic aneurysm, without rupture: Secondary | ICD-10-CM | POA: Insufficient documentation

## 2024-02-28 DIAGNOSIS — I63232 Cerebral infarction due to unspecified occlusion or stenosis of left carotid arteries: Secondary | ICD-10-CM

## 2024-02-28 DIAGNOSIS — F17200 Nicotine dependence, unspecified, uncomplicated: Secondary | ICD-10-CM | POA: Insufficient documentation

## 2024-02-28 DIAGNOSIS — I779 Disorder of arteries and arterioles, unspecified: Secondary | ICD-10-CM | POA: Insufficient documentation

## 2024-02-28 DIAGNOSIS — E785 Hyperlipidemia, unspecified: Secondary | ICD-10-CM | POA: Diagnosis not present

## 2024-02-28 DIAGNOSIS — I6521 Occlusion and stenosis of right carotid artery: Secondary | ICD-10-CM

## 2024-02-28 DIAGNOSIS — I739 Peripheral vascular disease, unspecified: Secondary | ICD-10-CM | POA: Insufficient documentation

## 2024-02-28 LAB — CBC
HCT: 38.6 % — ABNORMAL LOW (ref 39.0–52.0)
Hemoglobin: 13.1 g/dL (ref 13.0–17.0)
MCH: 31.4 pg (ref 26.0–34.0)
MCHC: 33.9 g/dL (ref 30.0–36.0)
MCV: 92.6 fL (ref 80.0–100.0)
Platelets: 297 K/uL (ref 150–400)
RBC: 4.17 MIL/uL — ABNORMAL LOW (ref 4.22–5.81)
RDW: 12.7 % (ref 11.5–15.5)
WBC: 9.4 K/uL (ref 4.0–10.5)
nRBC: 0 % (ref 0.0–0.2)

## 2024-02-28 LAB — I-STAT CHEM 8, ED
BUN: 33 mg/dL — ABNORMAL HIGH (ref 6–20)
Calcium, Ion: 1.15 mmol/L (ref 1.15–1.40)
Chloride: 101 mmol/L (ref 98–111)
Creatinine, Ser: 1.2 mg/dL (ref 0.61–1.24)
Glucose, Bld: 104 mg/dL — ABNORMAL HIGH (ref 70–99)
HCT: 43 % (ref 39.0–52.0)
Hemoglobin: 14.6 g/dL (ref 13.0–17.0)
Potassium: 4.5 mmol/L (ref 3.5–5.1)
Sodium: 129 mmol/L — ABNORMAL LOW (ref 135–145)
TCO2: 20 mmol/L — ABNORMAL LOW (ref 22–32)

## 2024-02-28 LAB — ECHOCARDIOGRAM COMPLETE
AR max vel: 3.3 cm2
AV Area VTI: 3.32 cm2
AV Area mean vel: 3.16 cm2
AV Mean grad: 3 mmHg
AV Peak grad: 6.7 mmHg
Ao pk vel: 1.29 m/s
Area-P 1/2: 4.06 cm2
Height: 74 in
S' Lateral: 2.6 cm
Weight: 3680 [oz_av]

## 2024-02-28 LAB — RAPID URINE DRUG SCREEN, HOSP PERFORMED
Amphetamines: NOT DETECTED
Barbiturates: NOT DETECTED
Benzodiazepines: POSITIVE — AB
Cocaine: NOT DETECTED
Opiates: NOT DETECTED
Tetrahydrocannabinol: NOT DETECTED

## 2024-02-28 LAB — LIPID PANEL
Cholesterol: 192 mg/dL (ref 0–200)
HDL: 45 mg/dL (ref >40)
LDL Cholesterol: 124 mg/dL — ABNORMAL HIGH (ref 0–99)
Total CHOL/HDL Ratio: 4.3 ratio
Triglycerides: 114 mg/dL (ref <150)
VLDL: 23 mg/dL (ref 0–40)

## 2024-02-28 LAB — VAS US ABI WITH/WO TBI
Left ABI: ABSENT
Right ABI: 0.62

## 2024-02-28 LAB — HEMOGLOBIN A1C
Hgb A1c MFr Bld: 6 % — ABNORMAL HIGH (ref 4.8–5.6)
Mean Plasma Glucose: 126 mg/dL

## 2024-02-28 LAB — BASIC METABOLIC PANEL WITH GFR
Anion gap: 12 (ref 5–15)
BUN: 28 mg/dL — ABNORMAL HIGH (ref 6–20)
CO2: 18 mmol/L — ABNORMAL LOW (ref 22–32)
Calcium: 9.6 mg/dL (ref 8.9–10.3)
Chloride: 100 mmol/L (ref 98–111)
Creatinine, Ser: 1.08 mg/dL (ref 0.61–1.24)
GFR, Estimated: >60 mL/min (ref >60)
Glucose, Bld: 88 mg/dL (ref 70–99)
Potassium: 4.6 mmol/L (ref 3.5–5.1)
Sodium: 130 mmol/L — ABNORMAL LOW (ref 135–145)

## 2024-02-28 LAB — HIV ANTIBODY (ROUTINE TESTING W REFLEX): HIV Screen 4th Generation wRfx: NONREACTIVE

## 2024-02-28 MED ORDER — ATORVASTATIN CALCIUM 40 MG PO TABS
40.0000 mg | ORAL_TABLET | Freq: Every day | ORAL | Status: DC
  Administered 2024-02-28 – 2024-03-10 (×13): 40 mg via ORAL
  Filled 2024-02-28 (×11): qty 1

## 2024-02-28 MED ORDER — OXYCODONE HCL 5 MG PO TABS
5.0000 mg | ORAL_TABLET | Freq: Three times a day (TID) | ORAL | Status: DC | PRN
  Administered 2024-02-28 (×2): 5 mg via ORAL
  Filled 2024-02-28 (×2): qty 1

## 2024-02-28 MED ORDER — FENTANYL CITRATE PF 50 MCG/ML IJ SOSY
12.5000 ug | PREFILLED_SYRINGE | Freq: Once | INTRAMUSCULAR | Status: AC
  Administered 2024-02-28: 12.5 ug via INTRAVENOUS
  Filled 2024-02-28: qty 1

## 2024-02-28 MED ORDER — OXYCODONE HCL 5 MG PO TABS
5.0000 mg | ORAL_TABLET | Freq: Four times a day (QID) | ORAL | Status: DC | PRN
  Administered 2024-02-28 – 2024-03-02 (×8): 5 mg via ORAL
  Filled 2024-02-28 (×8): qty 1

## 2024-02-28 MED ORDER — NICOTINE 21 MG/24HR TD PT24
21.0000 mg | MEDICATED_PATCH | Freq: Every day | TRANSDERMAL | Status: DC
  Administered 2024-02-28 – 2024-03-10 (×13): 21 mg via TRANSDERMAL
  Filled 2024-02-28 (×11): qty 1

## 2024-02-28 NOTE — Plan of Care (Signed)
 Is very impulsive and has to be reminded that he cannot get up out of the bed or chair without assistance.  Left great toe black and necrotic.  Went to vacular lab to have it checked and said he was told there is no blood flow to his 2nd toe.   Said Oxycodone  does help his pain.    Problem: Education: Goal: Knowledge of disease or condition will improve Outcome: Progressing   Problem: Ischemic Stroke/TIA Tissue Perfusion: Goal: Complications of ischemic stroke/TIA will be minimized Outcome: Progressing   Problem: Coping: Goal: Will verbalize positive feelings about self Outcome: Progressing   Problem: Nutrition: Goal: Risk of aspiration will decrease Outcome: Progressing   Problem: Nutrition: Goal: Dietary intake will improve Outcome: Progressing   Problem: Clinical Measurements: Goal: Will remain free from infection Outcome: Progressing   Problem: Clinical Measurements: Goal: Respiratory complications will improve Outcome: Progressing   Problem: Activity: Goal: Risk for activity intolerance will decrease Outcome: Progressing   Problem: Pain Managment: Goal: General experience of comfort will improve and/or be controlled Outcome: Progressing   Problem: Safety: Goal: Ability to remain free from injury will improve Outcome: Progressing

## 2024-02-28 NOTE — Progress Notes (Signed)
 Returned from vascular lab
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
## 2024-02-28 NOTE — Progress Notes (Signed)
 VASCULAR LAB    ABI has been performed.  See CV proc for preliminary results.   Quadir Muns, RVT 02/28/2024, 12:45 PM

## 2024-02-28 NOTE — Discharge Instructions (Addendum)
 Dear Todd [name],   Congratulations for your interest in quitting smoking!  Find a program that suits you best: when you want to quit, how you need support, where you live, and how you like to learn.    If you're ready to get started TODAY, consider scheduling a visit through [facility] [email or web address].  Appointments are available from 8am to 8pm, Monday to Friday.   Most health insurance plans will cover some level of tobacco cessation visits and medications.    Additional Resources: [employer] are also available to help you quit & provide the support you'll need. Many programs are available in both [place] and Spanish and have a long history of successfully helping people get off and stay off tobacco.    Quit Smoking Apps:  quitSTART at SeriousBroker.de QuitGuide?at ForgetParking.dk Online education and resources: Smokefree  at [employer].gov Free Telephone Coaching: QuitNow,  Call 1-800-QUIT-NOW (425 594 8825) or Text- Ready to 860-310-1223 *Quitline [place] has teamed up with Medicaid to offer a free 14 week program    Vaping- Want to Quit? Free 24/7 support. Call [facility]  [place], [place], [place], [place], [place]  [place]    [facility]  Discharge Instructions   Carotid Surgery  Please refer to the following instructions for your post-procedure care. Your surgeon or physician assistant will discuss any changes with you.  Activity  You are encouraged to walk as much as you can. You can slowly return to normal activities but must avoid strenuous activity and heavy lifting until your doctor tell you it's okay. Avoid activities such as vacuuming or swinging a golf club. You can drive after one week if you are comfortable and you are no longer taking prescription pain medications. It is normal to feel tired for serval weeks after your surgery. It is also normal to have  difficulty with sleep habits, eating, and bowel movements after surgery. These will go away with time.  Bathing/Showering  Shower daily after you go home. Do not soak in a bathtub, hot tub, or swim until the incision heals completely.  Incision Care  Shower every day. Clean your incision with mild soap and water. Pat the area dry with a clean towel. You do not need a bandage unless otherwise instructed. Do not apply any ointments or creams to your incision. You may have skin glue on your incision. Do not peel it off. It will come off on its own in about one week. Your incision may feel thickened and raised for several weeks after your surgery. This is normal and the skin will soften over time.   For Men Only: It's okay to shave around the incision but do not shave the incision itself for 2 weeks. It is common to have numbness under your chin that could last for several months.  Diet  Resume your normal diet. There are no special food restrictions following this procedure. A low fat/low cholesterol diet is recommended for all patients with vascular disease. In order to heal from your surgery, it is CRITICAL to get adequate nutrition. Your body requires vitamins, minerals, and protein. Vegetables are the best source of vitamins and minerals. Vegetables also provide the perfect balance of protein. Processed food has little nutritional value, so try to avoid this.  Medications  Resume taking all of your medications unless your doctor or physician assistant tells you not to. If your incision is causing pain, you may take over-the- counter pain relievers such as acetaminophen  (Tylenol ). If  you were prescribed a stronger pain medication, please be aware these medications can cause nausea and constipation. Prevent nausea by taking the medication with a snack or meal. Avoid constipation by drinking plenty of fluids and eating foods with a high amount of fiber, such as fruits, vegetables, and grains.  Do  not take Tylenol  if you are taking prescription pain medications.  Follow Up  Our office will schedule a follow up appointment 2-3 weeks following discharge.  Please call us  immediately for any of the following conditions  Increased pain, redness, drainage (pus) from your incision site. Fever of 101 degrees or higher. If you should develop stroke (slurred speech, difficulty swallowing, weakness on one side of your body, loss of vision) you should call 911 and go to the nearest emergency room.  Reduce your risk of vascular disease:  Stop smoking. If you would like help call [facility] at 1-800-QUIT-NOW (360 293 5140) or [place] at [phone]. Manage your cholesterol Maintain a desired weight Control your diabetes Keep your blood pressure down  If you have any questions, please call the office at [phone].   [facility]   Discharge Instructions  Endovascular Aortic Aneurysm Repair  Please refer to the following instructions for your post-procedure care. Your surgeon or Physician Assistant will discuss any changes with you.  Activity  You are encouraged to walk as much as you can. You can slowly return to normal activities but must avoid strenuous activity and heavy lifting until your doctor tells you it's OK. Avoid activities such as vacuuming or swinging a gold club. It is normal to feel tired for several weeks after your surgery. Do not drive until your doctor gives the OK and you are no longer taking prescription pain medications. It is also normal to have difficulty with sleep habits, eating, and bowel movements after surgery. These will go away with time.  Bathing/Showering  Shower daily after you go home.  Do not soak in a bathtub, hot tub, or swim until the incision heals completely.  If you have incisions in your groin, wash the groin wounds with soap and water daily and pat dry. (No tub bath-only shower)  Then put a dry gauze  or washcloth there to keep this area dry to help prevent wound infection daily and as needed.  Do not use Vaseline or neosporin on your incisions.  Only use soap and water on your incisions and then protect and keep dry.  Incision Care  Wash the groin wound with soap and water daily and pat dry. (No tub bath-only shower)  Then put a dry gauze or washcloth there to keep this area dry daily and as needed.  Do not use Vaseline or neosporin on your incisions.  Only use soap and water on your incisions and then protect and keep dry.   Diet  Resume your normal diet. There are no special food restrictions following this procedure. A low fat/low cholesterol diet is recommended for all patients with vascular disease. In order to heal from your surgery, it is CRITICAL to get adequate nutrition. Your body requires vitamins, minerals, and protein. Vegetables are the best source of vitamins and minerals. Vegetables also provide the perfect balance of protein. Processed food has little nutritional value, so try to avoid this.  Medications  Resume taking all of your medications unless your doctor or nurse practitioner tells you not to. If your incision is causing pain, you may take over-the-counter pain relievers such as acetaminophen  (Tylenol ). If  you were prescribed a stronger pain medication, please be aware these medications can cause nausea and constipation. Prevent nausea by taking the medication with a snack or meal. Avoid constipation by drinking plenty of fluids and eating foods with a high amount of fiber, such as fruits, vegetables, and grains.  Do not take Tylenol  if you are taking prescription pain medications.   Follow up  Our office will schedule a follow-up appointment with a CT scan 3-4 weeks after your surgery.  Please call us  immediately for any of the following conditions  Severe or worsening pain in your legs or feet or in your abdomen back or chest. Increased pain, redness, drainage  (pus) from your incision site. Increased abdominal pain, bloating, nausea, vomiting or persistent diarrhea. Fever of 101 degrees or higher. Swelling in your leg (s),  Reduce your risk of vascular disease  Stop smoking. If you would like help call [facility] at 1-800-QUIT-NOW (561-399-3530) or [facility] at [phone]. Manage your cholesterol Maintain a desired weight Control your diabetes Keep your blood pressure down  If you have questions, please call the office at [phone].

## 2024-02-28 NOTE — Evaluation (Signed)
 Occupational Therapy Evaluation Patient Details Name: Todd [name] MRN: [id number] DOB: 04-19-1966 Today's Date: 02/28/2024   History of Present Illness   Pt is a 58 y/o male presenting on 8/4 with R sided weakness and slurred speech. 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 PMH includes: HTN, R shoulder surgery.     Clinical Impressions PTA patient independent and working. Admitted for above and presents with problem list below, including impaired cognition, dysmetric R UE and mild balance deficits.  He mobilizes in room with contact guard fading to close supervision for safety, no AD.  Manages Adls with contact guard to supervision.  He demonstrates decreased awareness, recall and problem solving.  Does require cueing to clear toothpaste from mouth.  Would recommend further visual assessment next session.  Discussed recommendations with assist for med mgmt, meals, driving and IADLs- pt reports his girlfriend can assist. Based on performance today, pt will best benefit from continued OT services acutely and after dc at outpatient OT in order to optimize safety, independence and return to PLOF.       If plan is discharge home, recommend the following:   A little help with walking and/or transfers;A little help with bathing/dressing/bathroom;Assistance with cooking/housework;Assist for transportation;Direct supervision/assist for medications management;Direct supervision/assist for financial management     Functional Status Assessment   Patient has had a recent decline in their functional status and demonstrates the ability to make significant improvements in function in a reasonable and predictable amount of time.     Equipment Recommendations   Tub/shower seat     Recommendations for Other Services          Precautions/Restrictions   Precautions Precautions: Fall Recall of Precautions/Restrictions: Impaired Precaution/Restrictions Comments: Low fall risk Required Braces or Orthoses: Other Brace Other Brace: L post op shoe Restrictions Weight Bearing Restrictions Per Provider Order: No     Mobility Bed Mobility               General bed mobility comments: OOB    Transfers Overall transfer level: Needs assistance   Transfers: Sit to/from Stand Sit to Stand: Supervision           General transfer comment: for safety      Balance Overall balance assessment: Mild deficits observed, not formally tested                                         ADL either performed or assessed with clinical judgement   ADL Overall ADL's : Needs assistance/impaired     Grooming: Supervision/safety;Standing           Upper Body Dressing : Set up;Sitting   Lower Body Dressing: Supervision/safety;Sit to/from stand   Toilet Transfer: Contact guard assist;Supervision/safety;Ambulation   Toileting- Clothing Manipulation and Hygiene: Supervision/safety;Sit to/from stand       Functional mobility during ADLs: Contact guard assist;Supervision/safety       Vision Patient Visual Report: No change from baseline Vision Assessment?: No apparent visual deficits Additional Comments: appears functional, but needs further assessment     Perception         Praxis         Pertinent Vitals/Pain Pain Assessment Pain Assessment: 0-10 Pain Score: 10-Worst pain ever Pain  Location: L first toe Pain Descriptors / Indicators: Throbbing, Grimacing Pain Intervention(s): Limited activity within patient's tolerance, Monitored during session, Patient requesting pain meds-RN notified, Repositioned, RN gave pain meds during session     Extremity/Trunk Assessment Upper Extremity Assessment Upper Extremity Assessment: RUE deficits/detail;Right hand dominant RUE  Deficits / Details: strength and sensation intact, dysmetria with functional use. RUE Sensation: WNL RUE Coordination: decreased fine motor;decreased gross motor   Lower Extremity Assessment Lower Extremity Assessment: Defer to PT evaluation   Cervical / Trunk Assessment Cervical / Trunk Assessment: Normal   Communication Communication Communication: Impaired Factors Affecting Communication: Reduced clarity of speech   Cognition Arousal: Alert Behavior During Therapy: WFL for tasks assessed/performed Cognition: Cognition impaired     Awareness: Online awareness impaired Memory impairment (select all impairments): Working [employer] fast streamer, Short-term memory Attention impairment (select first level of impairment): Sustained attention Executive functioning impairment (select all impairments): Sequencing, Problem solving, Organization OT - Cognition Comments: parts of short blessed test completed, pt with decreased awareness of time of day, attention, problem solving and recall.  Only able to recall city of name/address provided. Able to count backwards but refuses  to state months backwards.                 Following commands: Impaired Following commands impaired: Follows multi-step commands inconsistently     Cueing  General Comments   Cueing Techniques: Verbal cues      Exercises     Shoulder Instructions      Home Living Family/patient expects to be discharged to:: Private residence Living Arrangements: Other (Comment) (girlfriend, girlfriends brother, 2 dogs and cat) Available Help at Discharge: Other (Comment) (gf) Type of Home: House Home Access: Stairs to enter [employer] of Steps: 3 Entrance Stairs-Rails: Right;Left Home Layout: Able to live on main level with bedroom/bathroom     Bathroom Shower/Tub: Producer, television/film/video: Handicapped height     Home Equipment: Grab bars - toilet      Lives With: Significant other    Prior  Functioning/Environment Prior Level of Function : Independent/Modified Independent;Driving;History of Falls (last six months)             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, 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 ADLs Comments: ind ADLs, IADLs    OT Problem List: Decreased strength;Decreased activity tolerance;Impaired balance (sitting and/or standing);Pain;Decreased coordination;Decreased cognition;Decreased knowledge of precautions   OT Treatment/Interventions: Self-care/ADL training;DME and/or AE instruction;Therapeutic activities;Patient/family education;Balance training;Cognitive remediation/compensation;Neuromuscular education      OT Goals(Current goals can be found in the care plan section)   Acute Rehab OT Goals Patient Stated Goal: home OT Goal Formulation: With patient Time For Goal Achievement: 03/13/24 Potential to Achieve Goals: Good   OT Frequency:  Min 2X/week    Co-evaluation              AM-PAC OT 6 Clicks Daily Activity     Outcome Measure Help from another person eating meals?: None Help from another person taking care of personal grooming?: A Little Help from another person toileting, which includes using toliet, bedpan, or urinal?: None Help from another person bathing (including washing, rinsing, drying)?: A Little Help from another person to put on and taking off regular upper body clothing?: A Little Help from another person to put on and taking off regular lower body clothing?: A Little 6 Click Score: 20   End of Session Equipment Utilized During Treatment: Other (comment) (L post op shoe) Nurse  Communication: Mobility status  Activity Tolerance: Patient tolerated treatment well Patient left: in chair;with call bell/phone within reach;with chair alarm set  OT Visit Diagnosis: Other abnormalities of gait and mobility (R26.89);Pain;Other symptoms and signs involving cognitive function;Other symptoms and signs involving the  nervous system (R29.898) Pain - Right/Left: Left Pain - part of body: Ankle and joints of foot                Time: 9048-8985 OT Time Calculation (min): 23 min Charges:  OT General Charges $OT Visit: 1 Visit OT Evaluation $OT Eval Moderate Complexity: 1 Mod OT Treatments $Self Care/Home Management : 8-22 mins  Etta NOVAK, OT Acute Rehabilitation Services Office [phone] Secure Chat Preferred    Etta GORMAN Hope 02/28/2024, 12:50 PM
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
## 2024-02-28 NOTE — Progress Notes (Signed)
*  PRELIMINARY RESULTS* Echocardiogram 2D Echocardiogram has been performed.  Todd [name] 02/28/2024, 11:44 AM

## 2024-02-28 NOTE — Plan of Care (Signed)
  Problem: Education: Goal: Knowledge of disease or condition will improve Outcome: Progressing Goal: Knowledge of secondary prevention will improve (MUST DOCUMENT ALL) Outcome: Progressing Goal: Knowledge of patient specific risk factors will improve (DELETE if not current risk factor) Outcome: Progressing   Problem: Ischemic Stroke/TIA Tissue Perfusion: Goal: Complications of ischemic stroke/TIA will be minimized Outcome: Progressing   Problem: Coping: Goal: Will verbalize positive feelings about self Outcome: Progressing Goal: Will identify appropriate support needs Outcome: Progressing   Problem: Health Behavior/Discharge Planning: Goal: Ability to manage health-related needs will improve Outcome: Progressing   Problem: Self-Care: Goal: Ability to participate in self-care as condition permits will improve Outcome: Progressing Goal: Verbalization of feelings and concerns over difficulty with self-care will improve Outcome: Progressing Goal: Ability to communicate needs accurately will improve Outcome: Progressing   Problem: Nutrition: Goal: Risk of aspiration will decrease Outcome: Progressing Goal: Dietary intake will improve Outcome: Progressing   Problem: Education: Goal: Knowledge of General Education information will improve Description: Including pain rating scale, medication(s)/side effects and non-pharmacologic comfort measures Outcome: Progressing
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
## 2024-02-28 NOTE — TOC Initial Note (Signed)
 Transition of Care [facility]) - Initial/Assessment Note    Patient Details  Name: Todd [name] MRN: [id number] Date of Birth: Jun 25, 1966  [facility] CM/SW Contact:    Andrez JULIANNA George, RN Phone Number: 02/28/2024, 1:39 PM  Clinical Narrative:                  Pt is from home with his girlfriend who he states is with him most of the time.  Pt manages his own medications and denies any issues.  Girlfriend provides needed transportation.  [facility]patient [facility]. Information on the AVS. Pt will call to schedule the first appointment. IP Care Management following for further d/c needs.   Expected Discharge Plan: OP Rehab Barriers to Discharge: Continued Medical Work up   Patient Goals and CMS Choice     Choice offered to / list presented to : Patient      Expected Discharge Plan and Services   Discharge Planning Services: CM Consult   Living arrangements for the past 2 months: Single Family Home                                      Prior Living Arrangements/Services Living arrangements for the past 2 months: Single Family Home Lives with:: Significant Other Patient language and need for interpreter reviewed:: Yes Do you feel safe going back to the place where you live?: Yes        Care giver support system in place?: Yes (comment) Current home services: DME (cane/ walker/ wheelchair) Criminal Activity/Legal Involvement Pertinent to Current Situation/Hospitalization: No - Comment as needed  Activities of Daily Living      Permission Sought/Granted                  Emotional Assessment Appearance:: Appears stated age Attitude/Demeanor/Rapport: Lethargic   Orientation: : Oriented to Self, Oriented to Place, Oriented to Situation   Psych Involvement: No (comment)  Admission diagnosis:  CVA (cerebral vascular accident) (HCC) [I63.9] Acute CVA (cerebrovascular accident) [facility]) [I63.9] Patient Active Problem  List   Diagnosis Date Noted   Infrarenal abdominal aortic aneurysm (AAA) without rupture (HCC) 02/28/2024   PAD (peripheral artery disease) (HCC) 02/28/2024   Tobacco use disorder 02/28/2024   Pulmonary nodule 02/28/2024   Bilateral carotid artery disease (HCC) 02/28/2024   CVA (cerebral vascular accident) (HCC) 02/27/2024   Ingrown nail of great toe 01/13/2024   Closed fracture of distal phalanx of great toe 12/13/2023   Contusion of great toe of left foot 11/28/2023   Essential hypertension 05/07/2021   PCP:  Tanda Bleacher, MD Pharmacy:   [id number] - [place], [place] - [address] [place] [address] Phone: [phone] Fax: [phone]  [id number] [place], [place] - [address] [place] [address] Phone: [phone] Fax: [phone]     Social Drivers of Health (SDOH) Social History: SDOH Screenings   Food Insecurity: No Food Insecurity (10/05/2022)  Housing: Low Risk  (10/05/2022)  Transportation Needs: No Transportation Needs (10/05/2022)  Utilities: Not At Risk (10/05/2022)  Alcohol Screen: Low Risk  (10/05/2022)  Depression (PHQ2-9): Low Risk  (10/06/2023)  Financial Resource Strain: Low Risk  (10/05/2022)  Physical Activity: Sufficiently Active (10/05/2022)  Social Connections: Socially Isolated (10/05/2022)  Stress: No Stress Concern Present (04/07/2023)  Tobacco Use: High Risk (11/29/2023)  Health Literacy: Adequate Health Literacy (  04/07/2023)   SDOH Interventions:     Readmission Risk Interventions     No data to display
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
## 2024-02-28 NOTE — Progress Notes (Signed)
 To vascular lab in bed accompanied by transporter

## 2024-02-28 NOTE — Plan of Care (Signed)
  Problem: Education: Goal: Knowledge of disease or condition will improve Outcome: Progressing Goal: Knowledge of secondary prevention will improve (MUST DOCUMENT ALL) Outcome: Progressing Goal: Knowledge of patient specific risk factors will improve (DELETE if not current risk factor) Outcome: Progressing   Problem: Ischemic Stroke/TIA Tissue Perfusion: Goal: Complications of ischemic stroke/TIA will be minimized Outcome: Progressing   Problem: Coping: Goal: Will verbalize positive feelings about self Outcome: Progressing Goal: Will identify appropriate support needs Outcome: Progressing   Problem: Health Behavior/Discharge Planning: Goal: Ability to manage health-related needs will improve Outcome: Progressing Goal: Goals will be collaboratively established with patient/family Outcome: Progressing   Problem: Self-Care: Goal: Ability to participate in self-care as condition permits will improve Outcome: Progressing Goal: Verbalization of feelings and concerns over difficulty with self-care will improve Outcome: Progressing Goal: Ability to communicate needs accurately will improve Outcome: Progressing   Problem: Nutrition: Goal: Risk of aspiration will decrease Outcome: Progressing Goal: Dietary intake will improve Outcome: Progressing   Problem: Education: Goal: Knowledge of General Education information will improve Description: Including pain rating scale, medication(s)/side effects and non-pharmacologic comfort measures Outcome: Progressing

## 2024-02-28 NOTE — TOC CAGE-AID Note (Signed)
 Transition of Care [facility]) - CAGE-AID Screening   Patient Details  Name: Todd [name] MRN: [id number] Date of Birth: [birth year]  [facility]) CM/SW Contact:    Rondia Higginbotham E Draven Natter, LCSW Phone Number: 02/28/2024, 9:38 AM   Clinical Narrative: Patient declines SA resource needs. Patient states he has not drank alcohol or used drugs in many years.    CAGE-AID Screening:    Have You Ever Felt You Ought to Cut Down on Your Drinking or Drug Use?: No Have People Annoyed You By Critizing Your Drinking Or Drug Use?: No Have You Felt Bad Or Guilty About Your Drinking Or Drug Use?: No Have You Ever Had a Drink or Used Drugs First Thing In The Morning to Steady Your Nerves or to Get Rid of a Hangover?: No CAGE-AID Score: 0  Substance Abuse Education Offered: No

## 2024-02-29 ENCOUNTER — Encounter (HOSPITAL_COMMUNITY): Payer: Self-pay | Admitting: Internal Medicine

## 2024-02-29 DIAGNOSIS — I70222 Atherosclerosis of native arteries of extremities with rest pain, left leg: Secondary | ICD-10-CM | POA: Diagnosis not present

## 2024-02-29 DIAGNOSIS — I7143 Infrarenal abdominal aortic aneurysm, without rupture: Secondary | ICD-10-CM | POA: Diagnosis not present

## 2024-02-29 DIAGNOSIS — I700 Atherosclerosis of aorta: Secondary | ICD-10-CM

## 2024-02-29 DIAGNOSIS — I70209 Unspecified atherosclerosis of native arteries of extremities, unspecified extremity: Secondary | ICD-10-CM

## 2024-02-29 DIAGNOSIS — I639 Cerebral infarction, unspecified: Secondary | ICD-10-CM | POA: Diagnosis not present

## 2024-02-29 DIAGNOSIS — I63232 Cerebral infarction due to unspecified occlusion or stenosis of left carotid arteries: Secondary | ICD-10-CM | POA: Diagnosis not present

## 2024-02-29 DIAGNOSIS — I7409 Other arterial embolism and thrombosis of abdominal aorta: Secondary | ICD-10-CM

## 2024-02-29 LAB — RENAL FUNCTION PANEL
Albumin: 3.8 g/dL (ref 3.5–5.0)
Anion gap: 12 (ref 5–15)
BUN: 24 mg/dL — ABNORMAL HIGH (ref 6–20)
CO2: 19 mmol/L — ABNORMAL LOW (ref 22–32)
Calcium: 9.6 mg/dL (ref 8.9–10.3)
Chloride: 97 mmol/L — ABNORMAL LOW (ref 98–111)
Creatinine, Ser: 0.98 mg/dL (ref 0.61–1.24)
GFR, Estimated: >60 mL/min (ref >60)
Glucose, Bld: 101 mg/dL — ABNORMAL HIGH (ref 70–99)
Phosphorus: 4.3 mg/dL (ref 2.5–4.6)
Potassium: 4 mmol/L (ref 3.5–5.1)
Sodium: 128 mmol/L — ABNORMAL LOW (ref 135–145)

## 2024-02-29 LAB — CBC
HCT: 39.2 % (ref 39.0–52.0)
Hemoglobin: 13.6 g/dL (ref 13.0–17.0)
MCH: 32.1 pg (ref 26.0–34.0)
MCHC: 34.7 g/dL (ref 30.0–36.0)
MCV: 92.5 fL (ref 80.0–100.0)
Platelets: 298 K/uL (ref 150–400)
RBC: 4.24 MIL/uL (ref 4.22–5.81)
RDW: 12.6 % (ref 11.5–15.5)
WBC: 8.9 K/uL (ref 4.0–10.5)
nRBC: 0 % (ref 0.0–0.2)

## 2024-02-29 LAB — TSH: TSH: 4.464 u[IU]/mL (ref 0.350–4.500)

## 2024-02-29 LAB — CORTISOL: Cortisol, Plasma: 21.2 ug/dL

## 2024-02-29 LAB — SODIUM, URINE, RANDOM: Sodium, Ur: <10 mmol/L

## 2024-02-29 LAB — OSMOLALITY, URINE: Osmolality, Ur: 826 mosm/kg (ref 300–900)

## 2024-02-29 LAB — MAGNESIUM: Magnesium: 2.2 mg/dL (ref 1.7–2.4)

## 2024-02-29 MED ORDER — SODIUM CHLORIDE 1 G PO TABS
1.0000 g | ORAL_TABLET | Freq: Three times a day (TID) | ORAL | Status: DC
  Administered 2024-02-29 – 2024-03-06 (×18): 1 g via ORAL
  Filled 2024-02-29 (×16): qty 1

## 2024-02-29 NOTE — Progress Notes (Signed)
  Progress Note    02/29/2024 9:30 AM * No surgery found *  Subjective: No overnight issues, speaking better subjectively  Vitals:   02/28/24 2327 02/29/24 0411  BP: 110/74 106/85  Pulse: 88 91  Resp: 18 18  Temp: 98.5 F (36.9 C) 98.5 F (36.9 C)  SpO2: 98% 100%    Physical Exam: Awake alert and oriented Speech is improved  CBC    Component Value Date/Time   WBC 8.9 02/29/2024 0435   RBC 4.24 02/29/2024 0435   HGB 13.6 02/29/2024 0435   HGB 13.7 10/06/2023 1530   HCT 39.2 02/29/2024 0435   HCT 40.7 10/06/2023 1530   PLT 298 02/29/2024 0435   PLT 231 10/06/2023 1530   MCV 92.5 02/29/2024 0435   MCV 97 10/06/2023 1530   MCH 32.1 02/29/2024 0435   MCHC 34.7 02/29/2024 0435   RDW 12.6 02/29/2024 0435   RDW 12.5 10/06/2023 1530   LYMPHSABS 1.8 02/27/2024 1426   LYMPHSABS 2.6 10/06/2023 1530   MONOABS 0.9 02/27/2024 1426   EOSABS 0.0 02/27/2024 1426   EOSABS 0.3 10/06/2023 1530   BASOSABS 0.1 02/27/2024 1426   BASOSABS 0.1 10/06/2023 1530    BMET    Component Value Date/Time   NA 128 (L) 02/29/2024 0435   NA 136 10/06/2023 1530   K 4.0 02/29/2024 0435   CL 97 (L) 02/29/2024 0435   CO2 19 (L) 02/29/2024 0435   GLUCOSE 101 (H) 02/29/2024 0435   BUN 24 (H) 02/29/2024 0435   BUN 16 10/06/2023 1530   CREATININE 0.98 02/29/2024 0435   CALCIUM  9.6 02/29/2024 0435   GFRNONAA >60 02/29/2024 0435   GFRAA 112 02/16/2018 0939    INR    Component Value Date/Time   INR 1.4 (H) 02/27/2024 1532     Intake/Output Summary (Last 24 hours) at 02/29/2024 0930 Last data filed at 02/28/2024 1916 Gross per 24 hour  Intake 240 ml  Output --  Net 240 ml     Assessment/plan:  58 y.o. male is here with symptomatic left ICA stenosis.  Plan will be for left carotid revascularization likely TCAR possible carotid endarterectomy on Friday.  From there patient will require angiography to plan repair of his aortoiliac stenosis with concomitant aneurysm as well as left lower  extremity arterial occlusive disease for black toe on the left.  This was discussed with the patient he demonstrates good understanding.  I will also need to communicate with his wife but as she is deaf this will need to be in person with the patient.    Taisia Fantini C. Sheree, MD [facility] Office: [phone] Pager: [phone]  02/29/2024 9:30 AM
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
## 2024-02-29 NOTE — Progress Notes (Addendum)
 Occupational Therapy Treatment Patient Details Name: Todd [name] MRN: [id number] DOB: [birth year] Today's Date: 02/29/2024   History of present illness Pt is a 58 y/o male presenting on 8/4 with R sided weakness and slurred speech. 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 PMH includes: HTN, R shoulder surgery.   OT comments  Patient exiting bathroom upon entry (no shoe on L foot), and pt declined to don due to pain at this time.  At EOB, engaged in pill box test to further assess higher level functional cognition including attention, planning, mental flexibility, suboptimal search strategies, concrete thinking and multitasking.  Pt had 44 errors (normal is 3 or less).  Discussed findings with patient, he reports the assessment not being difficult initially but when reviewed he understands how he made mistakes.  Pt agreeable to have girlfriend assist with IADls (meds, finances, meals and driving) at dc. Vision further assessed with pt having to shift vision to R upper quadrant multiple times and noted to miss 2 medication bottles on the R side during med mgmt assessment, will need further assessment functionally. Will benefit from further outpatient OT services at dc and will follow acutely.       If plan is discharge home, recommend the following:  A little help with walking and/or transfers;A little help with bathing/dressing/bathroom;Assistance with cooking/housework;Assist for transportation;Direct supervision/assist for medications management;Direct supervision/assist for financial management;Supervision due to cognitive status   Equipment Recommendations  Tub/shower seat    Recommendations for Other Services      Precautions / Restrictions Precautions Precautions: Fall Recall of Precautions/Restrictions:  Impaired Precaution/Restrictions Comments: Low fall risk Required Braces or Orthoses: Other Brace Other Brace: L post op shoe Restrictions Weight Bearing Restrictions Per Provider Order: No       Mobility Bed Mobility Overal bed mobility: Modified Independent             General bed mobility comments: OOB upon entry, returned to supine without assist    Transfers Overall transfer level: Needs assistance   Transfers: Sit to/from Stand Sit to Stand: Supervision                 Balance Overall balance assessment: Mild deficits observed, not formally tested                                         ADL either performed or assessed with clinical judgement   ADL Overall ADL's : Needs assistance/impaired     Grooming: Supervision/safety;Standing                   Toilet Transfer: Supervision/safety;Ambulation Toilet Transfer Details (indicate cue type and reason): exiting bathroom upon entry, supervision for safety         Functional mobility during ADLs: Supervision/safety      Extremity/Trunk Assessment              Vision   Vision Assessment?: Yes Eye Alignment: Within Functional Limits Ocular Range of Motion: Within Functional Limits Alignment/Gaze Preference: Within Defined Limits Tracking/Visual Pursuits: Able to track stimulus in all quads without difficulty Convergence: Within functional limits Visual Fields:  (R upper quad deficits) Additional Comments: patient able to read pill bottles without difficulty, able to track without difficulty in  all directions given increased time.  Consistent with R upper quadrant visual shifting with identifying numbers with both R and L eye.   Perception     Praxis     Communication Communication Communication: Impaired Factors Affecting Communication: Reduced clarity of speech   Cognition Arousal: Alert Behavior During Therapy: WFL for tasks assessed/performed Cognition:  Cognition impaired     Awareness: Online awareness impaired Memory impairment (select all impairments): Working [employer] fast streamer, Short-term memory Attention impairment (select first level of impairment): Sustained attention Executive functioning impairment (select all impairments): Sequencing, Problem solving, Organization, Reasoning OT - Cognition Comments: failed pill box test    Functional cognition further assessed with The Pillbox Test: A Measure of Executive Functioning and Estimate of Medication Management. A straight pass/fail designation is determined by 3 or more errors of omission or misplacement on the task. Pt had a total of 44 errors.and failed the assessment, demonstrating deficits with planning, mental flexibility, suboptimal search strategies, concrete thinking and difficulty with multitasking.  Errors: One tablet 3x/day - 20 errors (omission/misplacement) One tablet 2x/day with breakfast and dinner - 14 errors (omission/misplacement) One tablet in the morning - 2 errors (omission/misplacement) One tablet daily at bedtime  - 7 errors(omission/misplacement) One tablet every other day - 1 errors (omission/misplacement)   Total time to complete task  - 5 min  Pt agreeable to complete pill box test.  Pt able to read labels correctly before assessment. Pt reports finishing pill box within 5 minutes, but noted to completely miss 2 medication bottles (on the R). Only filling out the whole week for 2 medications (1x in morning, although missing 1 day for each of the medications), and for the 3x/day medication placing all in am slot for 1 day only.  Pt understands mistakes, and agreeable to have assist with IADLs at dc.  Cognitive and possible visual perceptual deficits as well.               Following commands: Impaired Following commands impaired: Follows multi-step commands inconsistently      Cueing   Cueing Techniques: Verbal cues  Exercises      Shoulder Instructions        General Comments discussed safety and recommendations due to cognitive deficits- pt agreeable to have girlfriend assist with meds, meals, fianaces and driving at this time.    Pertinent Vitals/ Pain       Pain Assessment Pain Assessment: Faces Faces Pain Scale: Hurts little more Pain Location: L first toe Pain Descriptors / Indicators: Throbbing, Grimacing Pain Intervention(s): Limited activity within patient's tolerance, Monitored during session, Repositioned  Home Living Family/patient expects to be discharged to:: Private residence Living Arrangements: Spouse/significant other                                      Prior Functioning/Environment              Frequency  Min 2X/week        Progress Toward Goals  OT Goals(current goals can now be found in the care plan section)  Progress towards OT goals: Progressing toward goals  Acute Rehab OT Goals Patient Stated Goal: home OT Goal Formulation: With patient Time For Goal Achievement: 03/13/24 Potential to Achieve Goals: Good  Plan      Co-evaluation                 AM-PAC OT 6 Clicks Daily Activity  Outcome Measure   Help from another person eating meals?: None Help from another person taking care of personal grooming?: A Little Help from another person toileting, which includes using toliet, bedpan, or urinal?: None Help from another person bathing (including washing, rinsing, drying)?: A Little Help from another person to put on and taking off regular upper body clothing?: A Little Help from another person to put on and taking off regular lower body clothing?: A Little 6 Click Score: 20    End of Session    OT Visit Diagnosis: Other abnormalities of gait and mobility (R26.89);Pain;Other symptoms and signs involving cognitive function;Other symptoms and signs involving the nervous system (R29.898) Pain - Right/Left: Left Pain - part of body: Ankle and joints of foot    Activity Tolerance Patient tolerated treatment well   Patient Left with call bell/phone within reach;with bed alarm set;Other (comment) (sitting EOB)   Nurse Communication Mobility status        Time: 8899-8876 OT Time Calculation (min): 23 min  Charges: OT General Charges $OT Visit: 1 Visit OT Treatments $Self Care/Home Management : 8-22 mins $Cognitive Funtion inital: Initial 15 mins  Etta NOVAK, OT Acute Rehabilitation Services Office [phone] Secure Chat Preferred    Etta GORMAN Hope 02/29/2024, 1:47 PM

## 2024-02-29 NOTE — Plan of Care (Signed)

## 2024-03-01 DIAGNOSIS — R911 Solitary pulmonary nodule: Secondary | ICD-10-CM | POA: Diagnosis not present

## 2024-03-01 DIAGNOSIS — F17200 Nicotine dependence, unspecified, uncomplicated: Secondary | ICD-10-CM

## 2024-03-01 DIAGNOSIS — I63232 Cerebral infarction due to unspecified occlusion or stenosis of left carotid arteries: Secondary | ICD-10-CM | POA: Diagnosis not present

## 2024-03-01 DIAGNOSIS — I7143 Infrarenal abdominal aortic aneurysm, without rupture: Secondary | ICD-10-CM | POA: Diagnosis not present

## 2024-03-01 LAB — CBC
HCT: 37.1 % — ABNORMAL LOW (ref 39.0–52.0)
Hemoglobin: 12.8 g/dL — ABNORMAL LOW (ref 13.0–17.0)
MCH: 31.6 pg (ref 26.0–34.0)
MCHC: 34.5 g/dL (ref 30.0–36.0)
MCV: 91.6 fL (ref 80.0–100.0)
Platelets: 310 K/uL (ref 150–400)
RBC: 4.05 MIL/uL — ABNORMAL LOW (ref 4.22–5.81)
RDW: 12.3 % (ref 11.5–15.5)
WBC: 8.1 K/uL (ref 4.0–10.5)
nRBC: 0 % (ref 0.0–0.2)

## 2024-03-01 LAB — RENAL FUNCTION PANEL
Albumin: 3.5 g/dL (ref 3.5–5.0)
Anion gap: 12 (ref 5–15)
BUN: 23 mg/dL — ABNORMAL HIGH (ref 6–20)
CO2: 19 mmol/L — ABNORMAL LOW (ref 22–32)
Calcium: 9.4 mg/dL (ref 8.9–10.3)
Chloride: 100 mmol/L (ref 98–111)
Creatinine, Ser: 0.91 mg/dL (ref 0.61–1.24)
GFR, Estimated: >60 mL/min (ref >60)
Glucose, Bld: 95 mg/dL (ref 70–99)
Phosphorus: 4.2 mg/dL (ref 2.5–4.6)
Potassium: 4 mmol/L (ref 3.5–5.1)
Sodium: 131 mmol/L — ABNORMAL LOW (ref 135–145)

## 2024-03-01 LAB — MAGNESIUM: Magnesium: 2.2 mg/dL (ref 1.7–2.4)

## 2024-03-01 LAB — TYPE AND SCREEN
ABO/RH(D): O POS
Antibody Screen: NEGATIVE

## 2024-03-01 LAB — ABO/RH: ABO/RH(D): O POS
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
## 2024-03-01 NOTE — Anesthesia Preprocedure Evaluation (Addendum)
 Anesthesia Evaluation  Patient identified by MRN, date of birth, ID band Patient awake    Reviewed: Allergy & Precautions, H&P , NPO status , Patient's Chart, lab work & pertinent test results  Airway Mallampati: III  TM Distance: >3 FB Neck ROM: Full    Dental no notable dental hx. (+) Poor Dentition, Dental Advisory Given   Pulmonary neg pulmonary ROS, Current Smoker   Pulmonary exam normal breath sounds clear to auscultation       Cardiovascular Exercise Tolerance: Good hypertension, + Peripheral Vascular Disease  negative cardio ROS Normal cardiovascular exam Rhythm:Regular Rate:Normal  ECHO 8/25 1. Left ventricular ejection fraction, by estimation, is 60 to 65%. The  left ventricle has normal function. The left ventricle has no regional  wall motion abnormalities. Left ventricular diastolic parameters are  consistent with Grade I diastolic  dysfunction (impaired relaxation).   2. Right ventricular systolic function is normal. The right ventricular  size is normal. There is normal pulmonary artery systolic pressure.   3. The mitral valve is normal in structure. No evidence of mitral valve  regurgitation. No evidence of mitral stenosis.   4. The aortic valve was not well visualized. There is mild calcification  of the aortic valve. There is mild thickening of the aortic valve. Aortic  valve regurgitation is not visualized. Aortic valve sclerosis is present,  with no evidence of aortic valve   stenosis.   5. The inferior vena cava is normal in size with greater than 50%  respiratory variability, suggesting right atrial pressure of 3 mmHg.     Neuro/Psych CVA negative neurological ROS  negative psych ROS   GI/Hepatic negative GI ROS, Neg liver ROS,,,  Endo/Other  negative endocrine ROS    Renal/GU negative Renal ROS  negative genitourinary   Musculoskeletal negative musculoskeletal ROS (+)    Abdominal    Peds negative pediatric ROS (+)  Hematology negative hematology ROS (+)   Anesthesia Other Findings   Reproductive/Obstetrics negative OB ROS                              Anesthesia Physical Anesthesia Plan  ASA: 4  Anesthesia Plan: General   Post-op Pain Management: Ofirmev  IV (intra-op)*   Induction: Intravenous  PONV Risk Score and Plan: 2 and Ondansetron , Dexamethasone  and Treatment may vary due to age or medical condition  Airway Management Planned: Oral ETT  Additional Equipment: Arterial line  Intra-op Plan:   Post-operative Plan: Extubation in OR  Informed Consent: I have reviewed the patients History and Physical, chart, labs and discussed the procedure including the risks, benefits and alternatives for the proposed anesthesia with the patient or authorized representative who has indicated his/her understanding and acceptance.       Plan Discussed with: Anesthesiologist and CRNA  Anesthesia Plan Comments: (  58 year old M with PMH of HTN, HLD, PAD, aortic aneurysm, left great toe ulcer and tobacco use disorder presented to ED with slurred speech, right-sided drooling, unsteady gait and right arm ataxia, and admitted with acute CVA.  Code stroke activated.  CT head without acute finding.  Patient declined TNK after risk-benefit discussion.  CT angio head and neck with bilateral carotid artery atherosclerosis with some luminal stenosis.  MRI brain showed scattered areas of acute infarct involving the left precentral gyrus including the right frontal motor region, lateral left precentral gyrus, left frontal operculum and left occipital lobe and remote infarcts in the left occipital lobe  as well as chronic microangiopathic ischemic change.    Vascular surgery consulted for carotid artery stenosis, infrarenal AAA and left critical limb ischemia with left great toe gangrene.  Plan for TCAR on 8/8 followed by angiography for aortoiliac stenosis  and left lower extremity arterial occlusion.    )         Anesthesia Quick Evaluation

## 2024-03-01 NOTE — TOC Progression Note (Signed)
 Transition of Care [facility]) - Progression Note    Patient Details  Name: Todd [name] MRN: [id number] Date of Birth: 1966/05/19  Transition of Care [facility] CM/SW Contact  Andrez JULIANNA George, RN Phone Number: 03/01/2024, 2:02 PM  Clinical Narrative:     Plan is for TCAR tomorrow. Current d/c recommendations are for outpatient therapy. Pt will need therapy evals post procedure.  IP Care Management following.  Expected Discharge Plan: OP Rehab Barriers to Discharge: Continued Medical Work up               Expected Discharge Plan and Services   Discharge Planning Services: CM Consult   Living arrangements for the past 2 months: Single Family Home                                       Social Drivers of Health (SDOH) Interventions SDOH Screenings   Food Insecurity: No Food Insecurity (02/29/2024)  Housing: Low Risk  (02/29/2024)  Transportation Needs: No Transportation Needs (02/29/2024)  Utilities: Not At Risk (02/29/2024)  Alcohol Screen: Low Risk  (10/05/2022)  Depression (PHQ2-9): Low Risk  (10/06/2023)  Financial Resource Strain: Low Risk  (10/05/2022)  Physical Activity: Sufficiently Active (10/05/2022)  Social Connections: Socially Isolated (10/05/2022)  Stress: No Stress Concern Present (04/07/2023)  Tobacco Use: High Risk (02/29/2024)  Health Literacy: Adequate Health Literacy (04/07/2023)    Readmission Risk Interventions     No data to display

## 2024-03-01 NOTE — Progress Notes (Addendum)
  Progress Note    03/01/2024 8:11 AM * No surgery found *  Subjective: Pain in the left foot.  Subjectively, speech continues to improve.  No further strokelike symptoms overnight.   Vitals:   03/01/24 0305 03/01/24 0722  BP: 124/82 135/87  Pulse: 85 91  Resp: 18 16  Temp: (!) 97.5 F (36.4 C) 97.7 F (36.5 C)  SpO2: 98% 100%   Physical Exam: Lungs:  non labored Extremities: Moving all extremities well Neurologic: speech somewhat slurred  CBC    Component Value Date/Time   WBC 8.1 03/01/2024 0318   RBC 4.05 (L) 03/01/2024 0318   HGB 12.8 (L) 03/01/2024 0318   HGB 13.7 10/06/2023 1530   HCT 37.1 (L) 03/01/2024 0318   HCT 40.7 10/06/2023 1530   PLT 310 03/01/2024 0318   PLT 231 10/06/2023 1530   MCV 91.6 03/01/2024 0318   MCV 97 10/06/2023 1530   MCH 31.6 03/01/2024 0318   MCHC 34.5 03/01/2024 0318   RDW 12.3 03/01/2024 0318   RDW 12.5 10/06/2023 1530   LYMPHSABS 1.8 02/27/2024 1426   LYMPHSABS 2.6 10/06/2023 1530   MONOABS 0.9 02/27/2024 1426   EOSABS 0.0 02/27/2024 1426   EOSABS 0.3 10/06/2023 1530   BASOSABS 0.1 02/27/2024 1426   BASOSABS 0.1 10/06/2023 1530    BMET    Component Value Date/Time   NA 131 (L) 03/01/2024 0318   NA 136 10/06/2023 1530   K 4.0 03/01/2024 0318   CL 100 03/01/2024 0318   CO2 19 (L) 03/01/2024 0318   GLUCOSE 95 03/01/2024 0318   BUN 23 (H) 03/01/2024 0318   BUN 16 10/06/2023 1530   CREATININE 0.91 03/01/2024 0318   CALCIUM  9.4 03/01/2024 0318   GFRNONAA >60 03/01/2024 0318   GFRAA 112 02/16/2018 0939    INR    Component Value Date/Time   INR 1.4 (H) 02/27/2024 1532     Intake/Output Summary (Last 24 hours) at 03/01/2024 9188 Last data filed at 02/29/2024 1945 Gross per 24 hour  Intake --  Output 300 ml  Net -300 ml     Assessment/Plan:  58 y.o. male is s/p symptomatic left ICA stenosis.  Plan as left-sided TCAR by Dr. Sheree tomorrow.  He will be n.p.o. past midnight.  Consent ordered.   He will also require  angiography to plan repair of iliac stenosis with concomitant aneurysm as well as left lower extremity arterial occlusive disease for black toe on the left. Continue aspirin , Plavix , statin   Donnice Sender, PA-C [facility] [phone] 03/01/2024 8:11 AM   I have independently interviewed and examined patient and agree with PA assessment and plan above.   Marquitta Persichetti C. Sheree, MD [facility] [place] Office: [phone] Pager: [phone]

## 2024-03-01 NOTE — Plan of Care (Signed)
  Problem: Education: Goal: Knowledge of disease or condition will improve Outcome: Progressing Goal: Knowledge of secondary prevention will improve (MUST DOCUMENT ALL) Outcome: Progressing Goal: Knowledge of patient specific risk factors will improve (DELETE if not current risk factor) Outcome: Progressing   Problem: Ischemic Stroke/TIA Tissue Perfusion: Goal: Complications of ischemic stroke/TIA will be minimized Outcome: Progressing   Problem: Coping: Goal: Will verbalize positive feelings about self Outcome: Progressing Goal: Will identify appropriate support needs Outcome: Progressing   Problem: Health Behavior/Discharge Planning: Goal: Ability to manage health-related needs will improve Outcome: Progressing Goal: Goals will be collaboratively established with patient/family Outcome: Progressing   Problem: Self-Care: Goal: Ability to participate in self-care as condition permits will improve Outcome: Progressing Goal: Verbalization of feelings and concerns over difficulty with self-care will improve Outcome: Progressing Goal: Ability to communicate needs accurately will improve Outcome: Progressing   Problem: Nutrition: Goal: Risk of aspiration will decrease Outcome: Progressing Goal: Dietary intake will improve Outcome: Progressing   Problem: Education: Goal: Knowledge of General Education information will improve Description: Including pain rating scale, medication(s)/side effects and non-pharmacologic comfort measures Outcome: Progressing   Problem: Health Behavior/Discharge Planning: Goal: Ability to manage health-related needs will improve Outcome: Progressing   Problem: Clinical Measurements: Goal: Ability to maintain clinical measurements within normal limits will improve Outcome: Progressing Goal: Will remain free from infection Outcome: Progressing Goal: Diagnostic test results will improve Outcome: Progressing Goal: Respiratory complications will  improve Outcome: Progressing Goal: Cardiovascular complication will be avoided Outcome: Progressing   Problem: Activity: Goal: Risk for activity intolerance will decrease Outcome: Progressing   Problem: Nutrition: Goal: Adequate nutrition will be maintained Outcome: Progressing   Problem: Elimination: Goal: Will not experience complications related to bowel motility Outcome: Progressing Goal: Will not experience complications related to urinary retention Outcome: Progressing   Problem: Pain Managment: Goal: General experience of comfort will improve and/or be controlled Outcome: Progressing   Problem: Safety: Goal: Ability to remain free from injury will improve Outcome: Progressing   Problem: Skin Integrity: Goal: Risk for impaired skin integrity will decrease Outcome: Progressing

## 2024-03-01 NOTE — Plan of Care (Signed)

## 2024-03-01 NOTE — Progress Notes (Signed)
 Physical Therapy Treatment Patient Details Name: Todd [name] MRN: [id number] DOB: 1965/11/21 Today's Date: 03/01/2024   History of Present Illness Pt is a 58 y/o male presenting on 8/4 with R sided weakness and slurred speech. 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 PMH includes: HTN, R shoulder surgery.    PT Comments  Pt received sitting in the recliner and agreeable to session. Pt able to tolerate increased gait distance and stair trials with up to CGA for safety. Pt opted to use RW during majority of gait trial for decreased L foot pain. Pt performed stair trial without buckling or LOB. Pt continues to benefit from PT services to progress toward functional mobility goals.    If plan is discharge home, recommend the following: Assistance with cooking/housework;Assist for transportation;Help with stairs or ramp for entrance   Can travel by private vehicle        Equipment Recommendations  None recommended by PT    Recommendations for Other Services       Precautions / Restrictions Precautions Precautions: Fall Recall of Precautions/Restrictions: Impaired Precaution/Restrictions Comments: Low fall risk Required Braces or Orthoses: Other Brace Other Brace: L post op shoe Restrictions Weight Bearing Restrictions Per Provider Order: No     Mobility  Bed Mobility               General bed mobility comments: Pt in recliner at beginning and end of session    Transfers Overall transfer level: Needs assistance   Transfers: Sit to/from Stand Sit to Stand: Supervision                Ambulation/Gait Ambulation/Gait assistance: Supervision, Contact guard assist Gait Distance (Feet): 250 Feet Assistive device: None, Rolling walker (2 wheels) Gait Pattern/deviations: Step-to pattern,  Step-through pattern, Decreased stance time - left, Antalgic, Trunk flexed Gait velocity: decreased     General Gait Details: Pt initially ambulating without RW demonstrating increased pain and limping. Pt utilized RW for remainder of the trial for improved fluidity and decreased pain. Cues for RW proximity and upright posture   Stairs Stairs: Yes Stairs assistance: Contact guard assist Stair Management: Two rails Number of Stairs: 4 General stair comments: cues for sequencing and CGA for safety   Wheelchair Mobility     Tilt Bed    Modified Rankin (Stroke Patients Only) Modified Rankin (Stroke Patients Only) Pre-Morbid Rankin Score: No significant disability Modified Rankin: Moderate disability     Balance Overall balance assessment: Mild deficits observed, not formally tested                                          Communication Communication Communication: Impaired Factors Affecting Communication: Reduced clarity of speech  Cognition Arousal: Alert Behavior During Therapy: WFL for tasks assessed/performed   PT - Cognitive impairments: Memory, Attention, Safety/Judgement                         Following commands: Impaired Following commands impaired: Follows multi-step commands inconsistently    Cueing    Exercises      General Comments        Pertinent Vitals/Pain Pain Assessment Pain Assessment: Faces Faces Pain Scale: Hurts little more Pain Location:  L first toe Pain Descriptors / Indicators: Throbbing, Discomfort Pain Intervention(s): Limited activity within patient's tolerance, Monitored during session     PT Goals (current goals can now be found in the care plan section) Acute Rehab PT Goals Patient Stated Goal: return to work, less pain PT Goal Formulation: With patient Time For Goal Achievement: 03/13/24 Progress towards PT goals: Progressing toward goals    Frequency    Min 2X/week       AM-PAC PT  6 Clicks Mobility   Outcome Measure  Help needed turning from your back to your side while in a flat bed without using bedrails?: None Help needed moving from lying on your back to sitting on the side of a flat bed without using bedrails?: None Help needed moving to and from a bed to a chair (including a wheelchair)?: None Help needed standing up from a chair using your arms (e.g., wheelchair or bedside chair)?: None Help needed to walk in hospital room?: A Little Help needed climbing 3-5 steps with a railing? : A Little 6 Click Score: 22    End of Session Equipment Utilized During Treatment: Other (comment) (post op shoe) Activity Tolerance: Patient tolerated treatment well Patient left: in chair;with call bell/phone within reach Nurse Communication: Mobility status PT Visit Diagnosis: Other abnormalities of gait and mobility (R26.89);Unsteadiness on feet (R26.81);Pain     Time: 8455-8397 PT Time Calculation (min) (ACUTE ONLY): 18 min  Charges:    $Gait Training: 8-22 mins PT General Charges $$ ACUTE PT VISIT: 1 Visit                     Darryle George, PTA Acute Rehabilitation Services Secure Chat Preferred  [phone]    Darryle George 03/01/2024, 4:38 PM

## 2024-03-02 ENCOUNTER — Inpatient Hospital Stay (HOSPITAL_COMMUNITY): Payer: Self-pay

## 2024-03-02 ENCOUNTER — Other Ambulatory Visit: Payer: Self-pay

## 2024-03-02 ENCOUNTER — Inpatient Hospital Stay (HOSPITAL_COMMUNITY)

## 2024-03-02 ENCOUNTER — Encounter (HOSPITAL_COMMUNITY)
Admission: EM | Disposition: A | Discharge status: Home Health | Payer: Self-pay | Source: Home / Self Care | Attending: Student

## 2024-03-02 ENCOUNTER — Encounter (HOSPITAL_COMMUNITY): Payer: Self-pay | Admitting: Internal Medicine

## 2024-03-02 DIAGNOSIS — Z9889 Other specified postprocedural states: Secondary | ICD-10-CM

## 2024-03-02 DIAGNOSIS — I63232 Cerebral infarction due to unspecified occlusion or stenosis of left carotid arteries: Secondary | ICD-10-CM | POA: Diagnosis not present

## 2024-03-02 HISTORY — PX: TRANSCAROTID ARTERY REVASCULARIZATIONÂ: SHX6778

## 2024-03-02 LAB — SURGICAL PCR SCREEN
MRSA, PCR: NEGATIVE
Staphylococcus aureus: NEGATIVE

## 2024-03-02 SURGERY — TRANSCAROTID ARTERY REVASCULARIZATION (TCAR)
Anesthesia: General | Site: Neck | Laterality: Left

## 2024-03-02 MED ORDER — IODIXANOL 320 MG/ML IV SOLN
INTRAVENOUS | Status: DC | PRN
  Administered 2024-03-02: 16 mL

## 2024-03-02 MED ORDER — LIDOCAINE HCL (PF) 1 % IJ SOLN
INTRAMUSCULAR | Status: AC
  Filled 2024-03-02: qty 5

## 2024-03-02 MED ORDER — DEXAMETHASONE SODIUM PHOSPHATE 10 MG/ML IJ SOLN
INTRAMUSCULAR | Status: AC
Start: 2024-03-02 — End: 2024-03-02
  Filled 2024-03-02: qty 1

## 2024-03-02 MED ORDER — SUGAMMADEX SODIUM 200 MG/2ML IV SOLN
INTRAVENOUS | Status: DC | PRN
  Administered 2024-03-02: 200 mg via INTRAVENOUS

## 2024-03-02 MED ORDER — BISACODYL 10 MG RE SUPP: 10.0000 mg | Freq: Every day | RECTAL | Status: DC | PRN

## 2024-03-02 MED ORDER — FENTANYL CITRATE (PF) 250 MCG/5ML IJ SOLN
INTRAMUSCULAR | Status: AC
  Filled 2024-03-02: qty 5

## 2024-03-02 MED ORDER — METOPROLOL TARTRATE 5 MG/5ML IV SOLN: 2.5000 mg | INTRAVENOUS | Status: DC | PRN

## 2024-03-02 MED ORDER — HEMOSTATIC AGENTS (NO CHARGE) OPTIME
TOPICAL | Status: DC | PRN
  Administered 2024-03-02: 1 via TOPICAL

## 2024-03-02 MED ORDER — GLYCOPYRROLATE 0.2 MG/ML IJ SOLN
INTRAMUSCULAR | Status: DC | PRN
Start: 2024-03-02 — End: 2024-03-02
  Administered 2024-03-02: .2 mg via INTRAVENOUS

## 2024-03-02 MED ORDER — FENTANYL CITRATE (PF) 250 MCG/5ML IJ SOLN
INTRAMUSCULAR | Status: AC
Start: 2024-03-02 — End: 2024-03-02
  Filled 2024-03-02: qty 5

## 2024-03-02 MED ORDER — CEFAZOLIN SODIUM-DEXTROSE 2-3 GM-%(50ML) IV SOLR
INTRAVENOUS | Status: DC | PRN
  Administered 2024-03-02: 2 g via INTRAVENOUS

## 2024-03-02 MED ORDER — OXYCODONE-ACETAMINOPHEN 5-325 MG PO TABS
1.0000 | ORAL_TABLET | ORAL | Status: DC | PRN
  Administered 2024-03-03 – 2024-03-04 (×6): 2 via ORAL
  Filled 2024-03-02 (×6): qty 2

## 2024-03-02 MED ORDER — SODIUM CHLORIDE 0.9 % IV SOLN
500.0000 mL | Freq: Once | INTRAVENOUS | Status: AC | PRN
  Administered 2024-03-08 (×2): 500 mL via INTRAVENOUS

## 2024-03-02 MED ORDER — PROPOFOL 10 MG/ML IV BOLUS
INTRAVENOUS | Status: AC
  Filled 2024-03-02: qty 20

## 2024-03-02 MED ORDER — POLYETHYLENE GLYCOL 3350 17 G PO PACK
17.0000 g | PACK | Freq: Every day | ORAL | Status: DC | PRN
  Administered 2024-03-04 – 2024-03-09 (×2): 17 g via ORAL
  Filled 2024-03-02 (×2): qty 1

## 2024-03-02 MED ORDER — FENTANYL CITRATE (PF) 100 MCG/2ML IJ SOLN
INTRAMUSCULAR | Status: AC
Start: 2024-03-02 — End: 2024-03-02
  Filled 2024-03-02: qty 2

## 2024-03-02 MED ORDER — HEPARIN SODIUM (PORCINE) 1000 UNIT/ML IJ SOLN
INTRAMUSCULAR | Status: DC | PRN
  Administered 2024-03-02: 11000 [IU] via INTRAVENOUS

## 2024-03-02 MED ORDER — CEFAZOLIN SODIUM-DEXTROSE 2-4 GM/100ML-% IV SOLN
2.0000 g | Freq: Three times a day (TID) | INTRAVENOUS | Status: AC
  Administered 2024-03-02 – 2024-03-03 (×2): 2 g via INTRAVENOUS
  Filled 2024-03-02 (×2): qty 100

## 2024-03-02 MED ORDER — PHENYLEPHRINE 80 MCG/ML (10ML) SYRINGE FOR IV PUSH (FOR BLOOD PRESSURE SUPPORT)
PREFILLED_SYRINGE | INTRAVENOUS | Status: DC | PRN
  Administered 2024-03-02: 80 ug via INTRAVENOUS

## 2024-03-02 MED ORDER — ONDANSETRON HCL 4 MG/2ML IJ SOLN
INTRAMUSCULAR | Status: AC
  Filled 2024-03-02: qty 2

## 2024-03-02 MED ORDER — PROTAMINE SULFATE 10 MG/ML IV SOLN
INTRAVENOUS | Status: DC | PRN
  Administered 2024-03-02: 50 mg via INTRAVENOUS

## 2024-03-02 MED ORDER — ENOXAPARIN SODIUM 40 MG/0.4ML IJ SOSY
40.0000 mg | PREFILLED_SYRINGE | INTRAMUSCULAR | Status: DC
  Administered 2024-03-03 – 2024-03-05 (×4): 40 mg via SUBCUTANEOUS
  Filled 2024-03-02 (×3): qty 0.4

## 2024-03-02 MED ORDER — FENTANYL CITRATE (PF) 250 MCG/5ML IJ SOLN
INTRAMUSCULAR | Status: DC | PRN
  Administered 2024-03-02: 100 ug via INTRAVENOUS
  Administered 2024-03-02 (×4): 50 ug via INTRAVENOUS
  Administered 2024-03-02: 100 ug via INTRAVENOUS
  Administered 2024-03-02 (×2): 50 ug via INTRAVENOUS

## 2024-03-02 MED ORDER — ONDANSETRON HCL 4 MG/2ML IJ SOLN: 4.0000 mg | Freq: Once | INTRAMUSCULAR | Status: DC | PRN

## 2024-03-02 MED ORDER — SODIUM CHLORIDE 0.9 % IV SOLN: INTRAVENOUS | Status: AC

## 2024-03-02 MED ORDER — PROPOFOL 10 MG/ML IV BOLUS
INTRAVENOUS | Status: DC | PRN
  Administered 2024-03-02: 140 mg via INTRAVENOUS
  Administered 2024-03-02: 30 mg via INTRAVENOUS

## 2024-03-02 MED ORDER — OXYCODONE HCL 5 MG PO TABS: 5.0000 mg | ORAL_TABLET | Freq: Once | ORAL | Status: DC | PRN

## 2024-03-02 MED ORDER — HEPARIN 6000 UNIT IRRIGATION SOLUTION
Status: DC | PRN
  Administered 2024-03-02: 1

## 2024-03-02 MED ORDER — MIDAZOLAM HCL 2 MG/2ML IJ SOLN
INTRAMUSCULAR | Status: DC | PRN
  Administered 2024-03-02: 2 mg via INTRAVENOUS

## 2024-03-02 MED ORDER — MORPHINE SULFATE (PF) 2 MG/ML IV SOLN
2.0000 mg | INTRAVENOUS | Status: DC | PRN
  Administered 2024-03-02 (×3): 2 mg via INTRAVENOUS
  Filled 2024-03-02 (×3): qty 1

## 2024-03-02 MED ORDER — MIDAZOLAM HCL 2 MG/2ML IJ SOLN
INTRAMUSCULAR | Status: AC
Start: 2024-03-02 — End: 2024-03-02
  Filled 2024-03-02: qty 2

## 2024-03-02 MED ORDER — ONDANSETRON HCL 4 MG/2ML IJ SOLN
INTRAMUSCULAR | Status: DC | PRN
  Administered 2024-03-02: 4 mg via INTRAVENOUS

## 2024-03-02 MED ORDER — CHLORHEXIDINE GLUCONATE 0.12 % MT SOLN
15.0000 mL | Freq: Once | OROMUCOSAL | Status: AC
  Administered 2024-03-02: 15 mL via OROMUCOSAL

## 2024-03-02 MED ORDER — LABETALOL HCL 5 MG/ML IV SOLN: 10.0000 mg | INTRAVENOUS | Status: DC | PRN

## 2024-03-02 MED ORDER — OXYCODONE HCL 5 MG/5ML PO SOLN: 5.0000 mg | Freq: Once | ORAL | Status: DC | PRN

## 2024-03-02 MED ORDER — DOCUSATE SODIUM 100 MG PO CAPS
100.0000 mg | ORAL_CAPSULE | Freq: Every day | ORAL | Status: DC
  Administered 2024-03-04 – 2024-03-09 (×7): 100 mg via ORAL
  Filled 2024-03-02 (×8): qty 1

## 2024-03-02 MED ORDER — EPHEDRINE 5 MG/ML INJ
INTRAVENOUS | Status: AC
Start: 2024-03-02 — End: 2024-03-02
  Filled 2024-03-02: qty 5

## 2024-03-02 MED ORDER — HEPARIN 6000 UNIT IRRIGATION SOLUTION
Status: AC
Start: 2024-03-02 — End: 2024-03-02
  Filled 2024-03-02: qty 500

## 2024-03-02 MED ORDER — FENTANYL CITRATE (PF) 100 MCG/2ML IJ SOLN
25.0000 ug | INTRAMUSCULAR | Status: DC | PRN
  Administered 2024-03-02: 50 ug via INTRAVENOUS

## 2024-03-02 MED ORDER — 0.9 % SODIUM CHLORIDE (POUR BTL) OPTIME
TOPICAL | Status: DC | PRN
  Administered 2024-03-02: 1000 mL

## 2024-03-02 MED ORDER — LIDOCAINE 2% (20 MG/ML) 5 ML SYRINGE
INTRAMUSCULAR | Status: AC
  Filled 2024-03-02: qty 5

## 2024-03-02 MED ORDER — ORAL CARE MOUTH RINSE: 15.0000 mL | Freq: Once | OROMUCOSAL | Status: AC

## 2024-03-02 MED ORDER — FENTANYL CITRATE (PF) 100 MCG/2ML IJ SOLN
100.0000 ug | Freq: Once | INTRAMUSCULAR | Status: AC
  Administered 2024-03-02: 100 ug via INTRAVENOUS
  Filled 2024-03-02: qty 2

## 2024-03-02 MED ORDER — LACTATED RINGERS IV SOLN: INTRAVENOUS | Status: DC

## 2024-03-02 MED ORDER — HYDRALAZINE HCL 20 MG/ML IJ SOLN: 5.0000 mg | INTRAMUSCULAR | Status: DC | PRN

## 2024-03-02 MED ORDER — HEPARIN SODIUM (PORCINE) 1000 UNIT/ML IJ SOLN
INTRAMUSCULAR | Status: AC
  Filled 2024-03-02: qty 10

## 2024-03-02 MED ORDER — PHENOL 1.4 % MT LIQD
1.0000 | OROMUCOSAL | Status: DC | PRN
Start: 2024-03-02 — End: 2024-03-10

## 2024-03-02 MED ORDER — LIDOCAINE 2% (20 MG/ML) 5 ML SYRINGE
INTRAMUSCULAR | Status: DC | PRN
  Administered 2024-03-02: 100 mg via INTRAVENOUS

## 2024-03-02 MED ORDER — MEPERIDINE HCL 25 MG/ML IJ SOLN: 6.2500 mg | INTRAMUSCULAR | Status: DC | PRN

## 2024-03-02 MED ORDER — ROCURONIUM BROMIDE 10 MG/ML (PF) SYRINGE
PREFILLED_SYRINGE | INTRAVENOUS | Status: AC
  Filled 2024-03-02: qty 10

## 2024-03-02 MED ORDER — DEXAMETHASONE SODIUM PHOSPHATE 10 MG/ML IJ SOLN
INTRAMUSCULAR | Status: DC | PRN
  Administered 2024-03-02: 10 mg via INTRAVENOUS

## 2024-03-02 MED ORDER — ROCURONIUM BROMIDE 10 MG/ML (PF) SYRINGE
PREFILLED_SYRINGE | INTRAVENOUS | Status: DC | PRN
  Administered 2024-03-02: 60 mg via INTRAVENOUS
  Administered 2024-03-02: 20 mg via INTRAVENOUS

## 2024-03-02 MED ORDER — PHENYLEPHRINE 80 MCG/ML (10ML) SYRINGE FOR IV PUSH (FOR BLOOD PRESSURE SUPPORT)
PREFILLED_SYRINGE | INTRAVENOUS | Status: AC
Start: 2024-03-02 — End: 2024-03-02
  Filled 2024-03-02: qty 10

## 2024-03-02 SURGICAL SUPPLY — 39 items
BAG BANDED W/RUBBER/TAPE 36X54 (MISCELLANEOUS) ×1 IMPLANT
BAG COUNTER SPONGE SURGICOUNT (BAG) ×1 IMPLANT
CANISTER SUCTION 3000ML PPV (SUCTIONS) ×1 IMPLANT
CATH BALLN ENROUTE 5X35 (CATHETERS) IMPLANT
CATH ROBINSON RED A/P 18FR (CATHETERS) IMPLANT
CLIP LIGATING EXTRA MED SLVR (CLIP) ×1 IMPLANT
CLIP LIGATING EXTRA SM BLUE (MISCELLANEOUS) ×1 IMPLANT
COVER DOME SNAP 22 D (MISCELLANEOUS) ×1 IMPLANT
COVER PROBE W GEL 5X96 (DRAPES) ×1 IMPLANT
DERMABOND ADVANCED .7 DNX12 (GAUZE/BANDAGES/DRESSINGS) ×1 IMPLANT
DRAPE FEMORAL ANGIO 80X135IN (DRAPES) ×1 IMPLANT
ELECTRODE REM PT RTRN 9FT ADLT (ELECTROSURGICAL) ×1 IMPLANT
GLOVE BIO SURGEON STRL SZ7.5 (GLOVE) ×1 IMPLANT
GOWN STRL REUS W/ TWL LRG LVL3 (GOWN DISPOSABLE) ×2 IMPLANT
GOWN STRL REUS W/ TWL XL LVL3 (GOWN DISPOSABLE) ×1 IMPLANT
GUIDEWIRE ENROUTE 0.014 (WIRE) ×1 IMPLANT
KIT BASIN OR (CUSTOM PROCEDURE TRAY) ×1 IMPLANT
KIT ENCORE 26 ADVANTAGE (KITS) ×1 IMPLANT
KIT INTRODUCER GALT 7 (INTRODUCER) ×1 IMPLANT
KIT TURNOVER KIT B (KITS) ×1 IMPLANT
NDL HYPO 25GX1X1/2 BEV (NEEDLE) IMPLANT
NEEDLE HYPO 25GX1X1/2 BEV (NEEDLE) IMPLANT
PACK CAROTID (CUSTOM PROCEDURE TRAY) ×1 IMPLANT
POSITIONER HEAD DONUT 9IN (MISCELLANEOUS) ×1 IMPLANT
POWDER SURGICEL 3.0 GRAM (HEMOSTASIS) IMPLANT
SET MICROPUNCTURE 5F STIFF (MISCELLANEOUS) ×1 IMPLANT
STENT TRANSCAROTID SYSTEM 8X40 (Permanent Stent) IMPLANT
SUT MNCRL AB 4-0 PS2 18 (SUTURE) ×1 IMPLANT
SUT PROLENE 5 0 C 1 24 (SUTURE) ×1 IMPLANT
SUT SILK 2 0 PERMA HAND 18 BK (SUTURE) ×1 IMPLANT
SUT SILK 3-0 18XBRD TIE 12 (SUTURE) IMPLANT
SUT VIC AB 3-0 SH 27X BRD (SUTURE) ×1 IMPLANT
SYR 10ML LL (SYRINGE) ×3 IMPLANT
SYR 20ML LL LF (SYRINGE) ×1 IMPLANT
SYR CONTROL 10ML LL (SYRINGE) IMPLANT
SYSTEM TRANSCAROTID NEUROPRTCT (MISCELLANEOUS) ×1 IMPLANT
TOWEL GREEN STERILE (TOWEL DISPOSABLE) ×1 IMPLANT
WATER STERILE IRR 1000ML POUR (IV SOLUTION) ×1 IMPLANT
WIRE BENTSON .035X145CM (WIRE) ×1 IMPLANT

## 2024-03-02 NOTE — Anesthesia Procedure Notes (Signed)
 Procedure Name: Intubation Date/Time: 03/02/2024 7:55 AM  Performed by: Elby Raelene SAUNDERS, CRNAPre-anesthesia Checklist: Patient identified, Emergency Drugs available, Suction available and Patient being monitored Patient Re-evaluated:Patient Re-evaluated prior to induction Oxygen Delivery Method: Circle System Utilized Preoxygenation: Pre-oxygenation with 100% oxygen Induction Type: IV induction Ventilation: Mask ventilation without difficulty Laryngoscope Size: Miller and 2 Grade View: Grade III Tube type: Oral Tube size: 7.5 mm Number of attempts: 1 Airway Equipment and Method: Stylet and Oral airway Placement Confirmation: ETT inserted through vocal cords under direct vision, positive ETCO2 and breath sounds checked- equal and bilateral Secured at: 22 cm Tube secured with: Tape Dental Injury: Teeth and Oropharynx as per pre-operative assessment

## 2024-03-02 NOTE — Progress Notes (Signed)
 OT Cancellation Note  Patient Details Name: Todd [name] MRN: [id number] DOB: [birth year]   Cancelled Treatment:    Reason Eval/Treat Not Completed: Patient at procedure or test/ unavailable- will follow and see as able.   Etta NOVAK, OT Acute Rehabilitation Services Office [phone] Secure Chat Preferred    Etta GORMAN Hope 03/02/2024, 8:19 AM

## 2024-03-02 NOTE — Transfer of Care (Signed)
 Immediate Anesthesia Transfer of Care Note  Patient: Todd [name]  Procedure(s) Performed: LEFT TRANSCAROTID ARTERY REVASCULARIZATION USING 8 X 40 MM ENROUTE TRANSCAROTID STENT (Left: Neck)  Patient Location: PACU  Anesthesia Type:General  Level of Consciousness: awake, alert , and oriented  Airway & Oxygen Therapy: Patient Spontanous Breathing  Post-op Assessment: Report given to RN and Post -op Vital signs reviewed and stable  Post vital signs: Reviewed and stable  Last Vitals:  Vitals Value Taken Time  BP 130/88 03/02/24 09:45  Temp    Pulse 87 03/02/24 09:46  Resp 13 03/02/24 09:46  SpO2 99 % 03/02/24 09:46  Vitals shown include unfiled device data.  Last Pain:  Vitals:   03/02/24 0648  TempSrc:   PainSc: 10-Worst pain ever      Patients Stated Pain Goal: 0 (03/01/24 0715)  Complications: No notable events documented.

## 2024-03-02 NOTE — Anesthesia Postprocedure Evaluation (Signed)
 Anesthesia Post Note  Patient: Nevaan Bunton Jain  Procedure(s) Performed: LEFT TRANSCAROTID ARTERY REVASCULARIZATION USING 8 X 40 MM ENROUTE TRANSCAROTID STENT (Left: Neck)     Patient location during evaluation: PACU Anesthesia Type: General Level of consciousness: awake and alert Pain management: pain level controlled Vital Signs Assessment: post-procedure vital signs reviewed and stable Respiratory status: spontaneous breathing, nonlabored ventilation, respiratory function stable and patient connected to nasal cannula oxygen Cardiovascular status: blood pressure returned to baseline and stable Postop Assessment: no apparent nausea or vomiting Anesthetic complications: no   No notable events documented.  Last Vitals:  Vitals:   03/02/24 1030 03/02/24 1045  BP: 136/83 111/79  Pulse: 78 73  Resp: 15 15  Temp:    SpO2: 96% 98%    Last Pain:  Vitals:   03/02/24 1015  TempSrc:   PainSc: 10-Worst pain ever    LLE Motor Response: Purposeful movement (03/02/24 1045) LLE Sensation: Full sensation (03/02/24 1045) RLE Motor Response: Purposeful movement (03/02/24 1045) RLE Sensation: Full sensation (03/02/24 1045)      Les Longmore

## 2024-03-02 NOTE — Progress Notes (Signed)
 Consent for procedure obtained, patient understanding verbalized.  NPO since midnight.  CHG wipes utilized with assistance, clean gown and socks provided and oral care performed.

## 2024-03-02 NOTE — Progress Notes (Signed)
 Left forearm piv infiltrated, iv dcd, site with pressure dressing and warm compress. Primary RN made aware.
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
## 2024-03-02 NOTE — Addendum Note (Signed)
 Addendum  created 03/02/24 1051 by Mallory Manus, MD   Clinical Note Signed

## 2024-03-02 NOTE — Progress Notes (Signed)
 PT Cancellation Note  Patient Details Name: Todd [name] MRN: [id number] DOB: Mar 16, 1966   Cancelled Treatment:    Reason Eval/Treat Not Completed: Patient at procedure or test/unavailable   [name] 03/02/2024, 7:35 AM Lenoard SQUIBB, PT Acute Rehabilitation Services Office: [phone]

## 2024-03-02 NOTE — Plan of Care (Signed)

## 2024-03-02 NOTE — Progress Notes (Signed)
  Day of Surgery Note    Subjective:  no complaints in recovery   Vitals:   03/02/24 1000 03/02/24 1015  BP: 130/84 137/89  Pulse: 84 81  Resp: 17 13  Temp:    SpO2: 99% 98%    Incisions:   left neck incision is clean and dry without hematoma Neuro: at baseline   Lungs:  non labored    Assessment/Plan:  This is a 58 y.o. male who is s/p  Left TCAR  -pt doing well in recovery and neuro at baseline -continue asa/plavix /statin -to 4E progressive later today   Lucie Apt, PA-C 03/02/2024 10:20 AM [phone]

## 2024-03-02 NOTE — Plan of Care (Signed)
  Problem: Education: Goal: Knowledge of disease or condition will improve Outcome: Progressing   Problem: Nutrition: Goal: Risk of aspiration will decrease Outcome: Progressing   Problem: Activity: Goal: Risk for activity intolerance will decrease Outcome: Progressing   

## 2024-03-02 NOTE — Op Note (Signed)
 Patient name: Todd [name] MRN: [id number] DOB: 12-Sep-1965 Sex: male  03/02/2024 Pre-operative Diagnosis: Symptomatic left ICA stenosis Post-operative diagnosis:  Same Surgeon:  Penne C. Sheree, MD Assistant: Lucie Apt, PA Procedure Performed: 1.  Left transcarotid artery stenting with 8 x 40mm EnRoute stent with 5 mm balloon pre and post dilatation, 11 minutes and 20 seconds flow reversal 2.  Percutaneous ultrasound-guided cannulation right common femoral vein for flow reversal sheath placement  Indications: 58 year old male with history of symptomatic left ICA stenosis now here with stroke.  We have discussed his options that include medical management with dual antiplatelet therapy and statin, stenting either from a transcarotid or transfemoral route or carotid endarterectomy.  We reviewed his CT images and results and need for future operations of his aortic aneurysm and left lower extremity revascularization and have settled on left-sided transcarotid artery revascularization.  Experience assistant was necessary to facilitate exposure of the left common carotid artery as well as passage of the wire, balloon and stent for stenting of the left ICA into the left common carotid artery.  Findings: The left common carotid artery was noted in the midsegment to have soft plaque.  We cannulated just above the clavicle where there was no identifiable plaque by ultrasound.  Left ICA had high-grade stenosis which was mixed density soft and calcific plaque.  Distally the ICA appeared normal.  After stenting there was initially a small waist which was ballooned with 5 mm balloon and at completion the stent was patent in 2 views.  At completion the Doppler signals in the common carotid artery were as expected and the patient was neurologically intact upon awakening from anesthesia.   Procedure:  The patient was identified in the holding area and taken to the operating room where he was placed under  general and general anesthesia was induced.  He was given prepped and draped in the left neck and bilateral groins in usual fashion, antibiotics were administered and a timeout was called.  We began using ultrasound to identify the left common carotid artery and this was marked on the skin.  There was notable disease in the midsegment but just above the clavicle was noted to be healthy.  We then used the ultrasound to cannulate the right common femoral vein.  The vein was then be patent compressible was cannulated with a micropuncture needle followed by wire and sheath a Bentson wire was placed followed by the 8 [place] flow reversal sheath and this was flushed with heparinized saline.  A vertical incision was then created between the 2 heads of the thoracotomy ostomy on the left.  We dissected down identified the IJ retracted this laterally identify the common carotid artery and encircled this with vessel loop and ability table the patient was fully heparinized.  A laterally directing 5-0 Prolene suture in a U-stitch configuration was then placed in the common carotid artery at the expected cannulation site.  Common carotid artery was then cannulated with a micropuncture oh followed by a wire to 3 cm sheath at 3 cm.  Angiography was performed in 2 views and a stiff J-wire was placed just below the carotid bifurcation and the lower virtual sheath was then placed.  This was affixed to the skin in 2 locations.  Flow reversal was initiated and confirmed.  After the ACT returned over 300 and the blood pressure was maintained as a mean of 100 we then clamped the common carotid artery.  We are able to use the balloon  to direct the wire into the ICA and primarily ballooned this with a 5 mm balloon and then primarily stented with 8 x 40 mm stent.  In 2 views there did appear to be a waist with rotating image intensifier we therefore postdilated with 5 mm balloon.  We then allow 2-1/2 minutes of washout.  Completion  angiography 2 views demonstrated patency of the stent.  Satisfied with this the wire was removed.  The clamp was removed and flow reversal was discontinued after 11 minutes and 20 seconds.  The sheath was removed and the U-stitch was cinched.  50 mg of protamine  was administered after Doppler signal firm.  We then obtained stasis and the wound closed in layers of Vicryl and Monocryl.  The right femoral sheath was removed and pressure was held until hemostasis was obtained there.  Patient was awakened from anesthesia having tolerated procedure well and noted to be neurologically intact and transferred to recovery area in stable condition.  WERE correct at completion.  EBL: 50 cc  Contrast: 25 cc     Xoe Hoe C. Sheree, MD [facility] Office: [phone] Pager: [phone]

## 2024-03-02 NOTE — Progress Notes (Addendum)
  Progress Note    03/02/2024 7:17 AM * Day of Surgery *  Subjective:  no new issues, speaking better  Vitals:   03/02/24 0320 03/02/24 0635  BP: 139/89 (!) (P) 157/108  Pulse: 84 (P) 93  Resp: 18 (P) 18  Temp: (!) 97.4 F (36.3 C) (P) 97.6 F (36.4 C)  SpO2: 100% (P) 100%    Physical Exam: Aaox3 Right facial droop is stable, voice somewhat improved Moving all extremities without limitation  CBC    Component Value Date/Time   WBC 8.1 03/01/2024 0318   RBC 4.05 (L) 03/01/2024 0318   HGB 12.8 (L) 03/01/2024 0318   HGB 13.7 10/06/2023 1530   HCT 37.1 (L) 03/01/2024 0318   HCT 40.7 10/06/2023 1530   PLT 310 03/01/2024 0318   PLT 231 10/06/2023 1530   MCV 91.6 03/01/2024 0318   MCV 97 10/06/2023 1530   MCH 31.6 03/01/2024 0318   MCHC 34.5 03/01/2024 0318   RDW 12.3 03/01/2024 0318   RDW 12.5 10/06/2023 1530   LYMPHSABS 1.8 02/27/2024 1426   LYMPHSABS 2.6 10/06/2023 1530   MONOABS 0.9 02/27/2024 1426   EOSABS 0.0 02/27/2024 1426   EOSABS 0.3 10/06/2023 1530   BASOSABS 0.1 02/27/2024 1426   BASOSABS 0.1 10/06/2023 1530    BMET    Component Value Date/Time   NA 131 (L) 03/01/2024 0318   NA 136 10/06/2023 1530   K 4.0 03/01/2024 0318   CL 100 03/01/2024 0318   CO2 19 (L) 03/01/2024 0318   GLUCOSE 95 03/01/2024 0318   BUN 23 (H) 03/01/2024 0318   BUN 16 10/06/2023 1530   CREATININE 0.91 03/01/2024 0318   CALCIUM  9.4 03/01/2024 0318   GFRNONAA >60 03/01/2024 0318   GFRAA 112 02/16/2018 0939    INR    Component Value Date/Time   INR 1.4 (H) 02/27/2024 1532    No intake or output data in the 24 hours ending 03/02/24 0717   Assessment/plan:  58 y.o. male is here with left cortical stroke 2/2 high-grade mixed density left ICA stenosis.  Plan for left transcarotid artery revascularization today.  Patient remains on aspirin , Plavix , statin.  From.  He will need left lower extremity revascularization and likely repair of aortic aneurysm when he has recovered  fully.  We again reiterated the risks particularly of stroke or recrudescence, the benefits and particularly that this will not lead to any recovery of his neurologic function.  We have discussed his options being continued medical management, stenting from either a transfemoral or transcarotid route versus carotid endarterectomy he agrees to proceed with transcarotid artery stenting.  Patient demonstrates good understanding and consent was signed.   Jarren Para C. Sheree, MD [facility] [place] Office: [phone] Pager: [phone]  03/02/2024 7:17 AM

## 2024-03-03 DIAGNOSIS — I63232 Cerebral infarction due to unspecified occlusion or stenosis of left carotid arteries: Secondary | ICD-10-CM | POA: Diagnosis not present

## 2024-03-03 DIAGNOSIS — I739 Peripheral vascular disease, unspecified: Secondary | ICD-10-CM | POA: Diagnosis not present

## 2024-03-03 DIAGNOSIS — I7143 Infrarenal abdominal aortic aneurysm, without rupture: Secondary | ICD-10-CM | POA: Diagnosis not present

## 2024-03-03 DIAGNOSIS — Z95820 Peripheral vascular angioplasty status with implants and grafts: Secondary | ICD-10-CM

## 2024-03-03 DIAGNOSIS — R911 Solitary pulmonary nodule: Secondary | ICD-10-CM | POA: Diagnosis not present

## 2024-03-03 LAB — BASIC METABOLIC PANEL WITH GFR
Anion gap: 10 (ref 5–15)
BUN: 16 mg/dL (ref 6–20)
CO2: 23 mmol/L (ref 22–32)
Calcium: 8.8 mg/dL — ABNORMAL LOW (ref 8.9–10.3)
Chloride: 99 mmol/L (ref 98–111)
Creatinine, Ser: 0.95 mg/dL (ref 0.61–1.24)
GFR, Estimated: >60 mL/min (ref >60)
Glucose, Bld: 87 mg/dL (ref 70–99)
Potassium: 3.9 mmol/L (ref 3.5–5.1)
Sodium: 132 mmol/L — ABNORMAL LOW (ref 135–145)

## 2024-03-03 LAB — CBC
HCT: 34.4 % — ABNORMAL LOW (ref 39.0–52.0)
Hemoglobin: 11.5 g/dL — ABNORMAL LOW (ref 13.0–17.0)
MCH: 31.5 pg (ref 26.0–34.0)
MCHC: 33.4 g/dL (ref 30.0–36.0)
MCV: 94.2 fL (ref 80.0–100.0)
Platelets: 281 K/uL (ref 150–400)
RBC: 3.65 MIL/uL — ABNORMAL LOW (ref 4.22–5.81)
RDW: 12.4 % (ref 11.5–15.5)
WBC: 8.5 K/uL (ref 4.0–10.5)
nRBC: 0 % (ref 0.0–0.2)
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
## 2024-03-03 NOTE — Progress Notes (Addendum)
  Progress Note    03/03/2024 8:35 AM 1 Day Post-Op  Subjective:  sitting up in chair, no complaints regarding left neck incision. Complaining of left toe pain   Vitals:   03/03/24 0000 03/03/24 0200  BP: (!) 138/90 (!) 147/77  Pulse:    Resp:    Temp:    SpO2:     Physical Exam: Cardiac:  regular Lungs:  non labored Incisions:  left neck incision is clean, dry and intact Extremities:  moving all extremities without deficits Neurologic: alert and oriented. Right facial droop unchanged, speech improving  CBC    Component Value Date/Time   WBC 8.5 03/03/2024 0500   RBC 3.65 (L) 03/03/2024 0500   HGB 11.5 (L) 03/03/2024 0500   HGB 13.7 10/06/2023 1530   HCT 34.4 (L) 03/03/2024 0500   HCT 40.7 10/06/2023 1530   PLT 281 03/03/2024 0500   PLT 231 10/06/2023 1530   MCV 94.2 03/03/2024 0500   MCV 97 10/06/2023 1530   MCH 31.5 03/03/2024 0500   MCHC 33.4 03/03/2024 0500   RDW 12.4 03/03/2024 0500   RDW 12.5 10/06/2023 1530   LYMPHSABS 1.8 02/27/2024 1426   LYMPHSABS 2.6 10/06/2023 1530   MONOABS 0.9 02/27/2024 1426   EOSABS 0.0 02/27/2024 1426   EOSABS 0.3 10/06/2023 1530   BASOSABS 0.1 02/27/2024 1426   BASOSABS 0.1 10/06/2023 1530    BMET    Component Value Date/Time   NA 132 (L) 03/03/2024 0500   NA 136 10/06/2023 1530   K 3.9 03/03/2024 0500   CL 99 03/03/2024 0500   CO2 23 03/03/2024 0500   GLUCOSE 87 03/03/2024 0500   BUN 16 03/03/2024 0500   BUN 16 10/06/2023 1530   CREATININE 0.95 03/03/2024 0500   CALCIUM  8.8 (L) 03/03/2024 0500   GFRNONAA >60 03/03/2024 0500   GFRAA 112 02/16/2018 0939    INR    Component Value Date/Time   INR 1.4 (H) 02/27/2024 1532     Intake/Output Summary (Last 24 hours) at 03/03/2024 0835 Last data filed at 03/02/2024 1839 Gross per 24 hour  Intake 2030 ml  Output 600 ml  Net 1430 ml     Assessment/Plan:  58 y.o. male is s/p Left TCAR 1 Day Post-Op   Neurologically intact following TCAR. No new deficits Left neck  incision is c/d/I without swelling or hematoma Hemodynamically stable VSS Continue Aspirin , Plavix , Statin Scheduled for Angiogram on Monday 8/11 for LLE revascularization   Teretha Damme, PA-C [facility] [phone] 03/03/2024 8:35 AM  VASCULAR STAFF ADDENDUM: I have independently interviewed and examined the patient. I agree with the above.  Plan for angiogram Monday  Norman GORMAN Serve MD [facility] Phone Number: [phone] 03/03/2024 10:44 AM

## 2024-03-03 NOTE — Progress Notes (Signed)
 Physical Therapy Treatment Patient Details Name: Todd [name] MRN: [id number] DOB: [birth year] Today's Date: 03/03/2024   History of Present Illness Pt is a 58 y/o male presenting on 8/4 with R sided weakness and slurred speech. 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 PMH includes: HTN, R shoulder surgery.    PT Comments  Pt is progressing well towards goals. Currently pt is Mod I for sit to stand, supervision for gait/stairs. Pt does not require an AD for gait. Pt with mild antalgic gait but no significant increase in pain after 400 ft of gait. Pt was able to perform stairs per home set up at supervision level with cues for sequencing. Due to pt current functional status, home set up and available assistance at home no recommended skilled physical therapy services at this time on discharge from acute care hospital setting. Will continue to follow in acute setting in order to ensure that pt returns home with decreased risk for falls, injury, re-hospitalization and improved activity tolerance.      If plan is discharge home, recommend the following: Assistance with cooking/housework;Assist for transportation;Help with stairs or ramp for entrance     Equipment Recommendations  None recommended by PT       Precautions / Restrictions Precautions Precautions: Fall Recall of Precautions/Restrictions: Impaired Precaution/Restrictions Comments: Low fall risk Required Braces or Orthoses: Other Brace Other Brace: L post op shoe Restrictions Weight Bearing Restrictions Per Provider Order: No     Mobility  Bed Mobility     General bed mobility comments: Pt in recliner at beginning and end of session    Transfers Overall transfer level: Modified independent Equipment used: None Transfers: Sit to/from Stand Sit to  Stand: Modified independent (Device/Increase time)    Ambulation/Gait Ambulation/Gait assistance: Supervision Gait Distance (Feet): 400 Feet Assistive device: None Gait Pattern/deviations: Step-to pattern, Step-through pattern, Decreased stance time - left, Antalgic Gait velocity: decreased Gait velocity interpretation: 1.31 - 2.62 ft/sec, indicative of limited community ambulator   General Gait Details: significant improvement in gait since last session. Continues with mildly antalgic gait.   Stairs   Stairs assistance: Supervision Stair Management: Two rails, Step to pattern, Forwards Number of Stairs: 3 General stair comments: cues for sequencing and supervision for safety     Modified Rankin (Stroke Patients Only) Modified Rankin (Stroke Patients Only) Pre-Morbid Rankin Score: No significant disability Modified Rankin: Moderate disability     Balance Overall balance assessment: Mild deficits observed, not formally tested      Communication Communication Communication: Impaired Factors Affecting Communication: Reduced clarity of speech  Cognition Arousal: Alert Behavior During Therapy: WFL for tasks assessed/performed   PT - Cognitive impairments: Memory, Attention, Safety/Judgement         Following commands: Impaired Following commands impaired: Follows multi-step commands inconsistently              Pertinent Vitals/Pain Pain Assessment Pain Assessment: 0-10 Pain Score: 9  Pain Location: L first toe Pain Descriptors / Indicators: Throbbing, Discomfort Pain Intervention(s): Limited activity within patient's tolerance, Monitored during session     PT Goals (current goals can now be found in the care plan section) Acute Rehab PT Goals Patient Stated Goal: return to work, less pain PT Goal Formulation: With patient Time For Goal Achievement: 03/13/24 Potential to Achieve Goals: Good Progress towards PT goals: Progressing  toward goals     Frequency    Min 2X/week      PT Plan  Continue with current POC        AM-PAC PT 6 Clicks Mobility   Outcome Measure  Help needed turning from your back to your side while in a flat bed without using bedrails?: None Help needed moving from lying on your back to sitting on the side of a flat bed without using bedrails?: None Help needed moving to and from a bed to a chair (including a wheelchair)?: None Help needed standing up from a chair using your arms (e.g., wheelchair or bedside chair)?: None Help needed to walk in hospital room?: A Little Help needed climbing 3-5 steps with a railing? : A Little 6 Click Score: 22    End of Session Equipment Utilized During Treatment: Other (comment) (post op shoe) Activity Tolerance: Patient tolerated treatment well Patient left: in chair;with call bell/phone within reach Nurse Communication: Mobility status PT Visit Diagnosis: Other abnormalities of gait and mobility (R26.89);Unsteadiness on feet (R26.81);Pain Pain - Right/Left: Left Pain - part of body: Ankle and joints of foot     Time: 8950-8878 PT Time Calculation (min) (ACUTE ONLY): 32 min  Charges:    $Gait Training: 8-22 mins $Therapeutic Activity: 8-22 mins PT General Charges $$ ACUTE PT VISIT: 1 Visit                     Todd [name], DPT, CLT  Acute Rehabilitation Services Office: [phone] (Secure chat preferred)    Todd VEAR [name] 03/03/2024, 11:28 AM

## 2024-03-03 NOTE — Progress Notes (Signed)
 Occupational Therapy Treatment Patient Details Name: Todd [name] MRN: [id number] DOB: 05-26-1966 Today's Date: 03/03/2024   History of present illness Pt is a 58 y/o male presenting on 8/4 with R sided weakness and slurred speech. 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 PMH includes: HTN, R shoulder surgery.   OT comments  Pt. Seen for skilled OT treatment session. Pt. Able to demonstrate in room ambulation to/from b.room with no DME.  No lob noted, good pace and management of post op shoe LLE.  Pt. With vocalized concerns regarding excessive saliva and drooling along with concerns I talk like I'm drunk now and I have to be able to talk at work, my main job is talking.  Reviewed compensatory strategies for saliva management and speech concerns. Pt. Verbalized understanding that he knows it will take a little time.  Pt. Will benefit from cont. Therapies while here and agree with d/c recommendations for outpatient therapies for continued progress with PT/OT/ST prn.        If plan is discharge home, recommend the following:  A little help with walking and/or transfers;A little help with bathing/dressing/bathroom;Assistance with cooking/housework;Assist for transportation;Direct supervision/assist for medications management;Direct supervision/assist for financial management;Supervision due to cognitive status   Equipment Recommendations  Tub/shower seat    Recommendations for Other Services      Precautions / Restrictions Precautions Precautions: Fall Recall of Precautions/Restrictions: Impaired Precaution/Restrictions Comments: Low fall risk Required Braces or Orthoses: Other Brace Other Brace: L post op shoe       Mobility Bed Mobility               General bed mobility comments: Pt in recliner at  beginning and end of session    Transfers Overall transfer level: Modified independent Equipment used: None Transfers: Sit to/from Stand, Bed to chair/wheelchair/BSC Sit to Stand: Modified independent (Device/Increase time)           General transfer comment: for safety     Balance                                           ADL either performed or assessed with clinical judgement   ADL Overall ADL's : Needs assistance/impaired                           Toilet Transfer Details (indicate cue type and reason): no dme used and no lob noted-amb. to/from b.room but denied need for actual use       Tub/Shower Transfer Details (indicate cue type and reason): reports he has a tub/shower he has to step over Functional mobility during ADLs: Supervision/safety      Extremity/Trunk Insurance account manager Communication Communication: Impaired Factors Affecting Communication: Reduced clarity of speech   Cognition Arousal: Alert Behavior During Therapy: WFL for tasks assessed/performed Cognition: Cognition impaired     Awareness: Online awareness impaired Memory impairment (select all impairments): Working [employer] fast streamer, Short-term memory Attention impairment (select first level of impairment): Sustained attention Executive functioning impairment (select all impairments): Sequencing, Problem  solving, Organization, Reasoning                   Following commands: Impaired Following commands impaired: Follows multi-step commands inconsistently      Cueing   Cueing Techniques: Verbal cues  Exercises      Shoulder Instructions       General Comments  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  Reviewed adding planned swallows prior to speaking and during speaking  along with slowing the pace to allow his thoughts and intended words to come out the way he wants.  Provided emotional support for his frustrations and concerns related to these changes.      Pertinent Vitals/ Pain       Pain Assessment Pain Assessment: No/denies pain  Home Living                                          Prior Functioning/Environment              Frequency  Min 2X/week        Progress Toward Goals  OT Goals(current goals can now be found in the care plan section)  Progress towards OT goals: Progressing toward goals     Plan      Co-evaluation                 AM-PAC OT 6 Clicks Daily Activity     Outcome Measure   Help from another person eating meals?: None Help from another person taking care of personal grooming?: A Little Help from another person toileting, which includes using toliet, bedpan, or urinal?: None Help from another person bathing (including washing, rinsing, drying)?: A Little Help from another person to put on and taking off regular upper body clothing?: A Little Help from another person to put on and taking off regular lower body clothing?: A Little 6 Click Score: 20    End of Session    OT Visit Diagnosis: Other abnormalities of gait and mobility (R26.89);Pain;Other symptoms and signs involving cognitive function;Other symptoms and signs involving the nervous system (R29.898) Pain - Right/Left: Left Pain - part of body: Ankle and joints of foot   Activity Tolerance Patient tolerated treatment well   Patient Left in chair;with call bell/phone within reach   Nurse Communication          Time: 8642-8588 OT Time Calculation (min): 14 min  Charges: OT General Charges $OT Visit: 1 Visit OT Treatments $Self Care/Home Management : 8-22 mins  Randall, COTA/L Acute Rehabilitation [phone]   CHRISTELLA Nest Lorraine-COTA/L  03/03/2024, 3:21 PM

## 2024-03-04 DIAGNOSIS — I63232 Cerebral infarction due to unspecified occlusion or stenosis of left carotid arteries: Secondary | ICD-10-CM | POA: Diagnosis not present

## 2024-03-04 DIAGNOSIS — Z95820 Peripheral vascular angioplasty status with implants and grafts: Secondary | ICD-10-CM

## 2024-03-04 DIAGNOSIS — I7143 Infrarenal abdominal aortic aneurysm, without rupture: Secondary | ICD-10-CM | POA: Diagnosis not present

## 2024-03-04 DIAGNOSIS — R911 Solitary pulmonary nodule: Secondary | ICD-10-CM | POA: Diagnosis not present

## 2024-03-04 DIAGNOSIS — I739 Peripheral vascular disease, unspecified: Secondary | ICD-10-CM | POA: Diagnosis not present

## 2024-03-04 LAB — BASIC METABOLIC PANEL WITH GFR
Anion gap: 12 (ref 5–15)
BUN: 20 mg/dL (ref 6–20)
CO2: 22 mmol/L (ref 22–32)
Calcium: 8.8 mg/dL — ABNORMAL LOW (ref 8.9–10.3)
Chloride: 100 mmol/L (ref 98–111)
Creatinine, Ser: 0.85 mg/dL (ref 0.61–1.24)
GFR, Estimated: >60 mL/min (ref >60)
Glucose, Bld: 112 mg/dL — ABNORMAL HIGH (ref 70–99)
Potassium: 3.8 mmol/L (ref 3.5–5.1)
Sodium: 134 mmol/L — ABNORMAL LOW (ref 135–145)

## 2024-03-04 MED ORDER — OXYCODONE HCL 5 MG PO TABS
10.0000 mg | ORAL_TABLET | Freq: Four times a day (QID) | ORAL | Status: DC | PRN
  Administered 2024-03-04 – 2024-03-07 (×13): 10 mg via ORAL
  Filled 2024-03-04 (×7): qty 2

## 2024-03-04 MED ORDER — HYDROMORPHONE HCL 1 MG/ML IJ SOLN
0.5000 mg | INTRAMUSCULAR | Status: DC | PRN
  Administered 2024-03-04 – 2024-03-05 (×5): 0.5 mg via INTRAVENOUS
  Filled 2024-03-04 (×5): qty 0.5
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
## 2024-03-04 NOTE — Progress Notes (Addendum)
  Progress Note    03/04/2024 7:55 AM 2 Days Post-Op  Subjective:  sitting up in chair eating breakfast. Left foot pain unchanged. Denies any pain in left neck incision. Feels speech is improving. No difficulty swallowing   Vitals:   03/03/24 1949 03/04/24 0249  BP: 106/72 109/73  Pulse: 88 74  Resp: 20 20  Temp: 97.9 F (36.6 C) 97.6 F (36.4 C)  SpO2: 97% 97%   Physical Exam: Cardiac:  regular Lungs:  non labored Incisions:  left neck incision is c/d/I without swelling or hematoma Extremities:  moving all extremities without deficits Neurologic: alert and oriented, speech coherent, slight right facial droop  CBC    Component Value Date/Time   WBC 8.5 03/03/2024 0500   RBC 3.65 (L) 03/03/2024 0500   HGB 11.5 (L) 03/03/2024 0500   HGB 13.7 10/06/2023 1530   HCT 34.4 (L) 03/03/2024 0500   HCT 40.7 10/06/2023 1530   PLT 281 03/03/2024 0500   PLT 231 10/06/2023 1530   MCV 94.2 03/03/2024 0500   MCV 97 10/06/2023 1530   MCH 31.5 03/03/2024 0500   MCHC 33.4 03/03/2024 0500   RDW 12.4 03/03/2024 0500   RDW 12.5 10/06/2023 1530   LYMPHSABS 1.8 02/27/2024 1426   LYMPHSABS 2.6 10/06/2023 1530   MONOABS 0.9 02/27/2024 1426   EOSABS 0.0 02/27/2024 1426   EOSABS 0.3 10/06/2023 1530   BASOSABS 0.1 02/27/2024 1426   BASOSABS 0.1 10/06/2023 1530    BMET    Component Value Date/Time   NA 134 (L) 03/04/2024 0344   NA 136 10/06/2023 1530   K 3.8 03/04/2024 0344   CL 100 03/04/2024 0344   CO2 22 03/04/2024 0344   GLUCOSE 112 (H) 03/04/2024 0344   BUN 20 03/04/2024 0344   BUN 16 10/06/2023 1530   CREATININE 0.85 03/04/2024 0344   CALCIUM  8.8 (L) 03/04/2024 0344   GFRNONAA >60 03/04/2024 0344   GFRAA 112 02/16/2018 0939    INR    Component Value Date/Time   INR 1.4 (H) 02/27/2024 1532    No intake or output data in the 24 hours ending 03/04/24 0755   Assessment/Plan:  58 y.o. male is s/p L TCAR  2 Days Post-Op   Neurologically intact Left neck incision is  c/d/I without swelling or hematoma Hemodynamically stable Continue Aspirin , Statin, Plavix  Left foot continues to be painful Scheduled for Aortogram, arteriogram of BLE with intervention on LLE tomorrow 8/11 in cath lab with Dr. Sheree Keep NPO after midnight Consent order placed   Corrina Lester, PA-C [facility] [phone] 03/04/2024 7:55 AM  VASCULAR STAFF ADDENDUM: I have independently interviewed and examined the patient. I agree with the above.   Norman GORMAN Serve MD [facility] Phone Number: [phone] 03/04/2024 9:41 AM

## 2024-03-04 NOTE — Progress Notes (Signed)
 Mobility Specialist Progress Note;   03/04/24 0930  Mobility  Activity Ambulated with assistance  Level of Assistance Contact guard assist, steadying assist  Assistive Device None  Distance Ambulated (ft) 200 ft  Activity Response Tolerated well  Mobility Referral Yes  Mobility visit 1 Mobility  Mobility Specialist Start Time (ACUTE ONLY) 0930  Mobility Specialist Stop Time (ACUTE ONLY) [id number]  Mobility Specialist Time Calculation (min) (ACUTE ONLY) 7 min   Pt greeted at doorway, agreeable to mobility. Required MinG assistance during ambulation for safety. VSS throughout. C/o L foot pain, otherwise asx. Pt returned to chair and left with all needs met.   Lauraine Erm Mobility Specialist Please contact via SecureChat or [employer] [phone]
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
## 2024-03-05 ENCOUNTER — Encounter (HOSPITAL_COMMUNITY)
Admission: EM | Disposition: A | Discharge status: Home Health | Payer: Self-pay | Source: Home / Self Care | Attending: Student

## 2024-03-05 ENCOUNTER — Encounter (HOSPITAL_COMMUNITY): Payer: Self-pay | Admitting: Vascular Surgery

## 2024-03-05 DIAGNOSIS — I7777 Dissection of artery of lower extremity: Secondary | ICD-10-CM | POA: Diagnosis not present

## 2024-03-05 DIAGNOSIS — Z95820 Peripheral vascular angioplasty status with implants and grafts: Secondary | ICD-10-CM

## 2024-03-05 DIAGNOSIS — I7409 Other arterial embolism and thrombosis of abdominal aorta: Secondary | ICD-10-CM | POA: Diagnosis not present

## 2024-03-05 DIAGNOSIS — I70262 Atherosclerosis of native arteries of extremities with gangrene, left leg: Secondary | ICD-10-CM | POA: Diagnosis not present

## 2024-03-05 DIAGNOSIS — I7143 Infrarenal abdominal aortic aneurysm, without rupture: Secondary | ICD-10-CM | POA: Diagnosis not present

## 2024-03-05 DIAGNOSIS — I714 Abdominal aortic aneurysm, without rupture, unspecified: Secondary | ICD-10-CM

## 2024-03-05 DIAGNOSIS — I63232 Cerebral infarction due to unspecified occlusion or stenosis of left carotid arteries: Secondary | ICD-10-CM | POA: Diagnosis not present

## 2024-03-05 DIAGNOSIS — I739 Peripheral vascular disease, unspecified: Secondary | ICD-10-CM | POA: Diagnosis not present

## 2024-03-05 DIAGNOSIS — R911 Solitary pulmonary nodule: Secondary | ICD-10-CM | POA: Diagnosis not present

## 2024-03-05 HISTORY — PX: ABDOMINAL AORTOGRAM W/LOWER EXTREMITY: CATH118223

## 2024-03-05 HISTORY — PX: LOWER EXTREMITY INTERVENTION: CATH118252

## 2024-03-05 LAB — POCT ACTIVATED CLOTTING TIME
Activated Clotting Time: 325 s
Activated Clotting Time: 251 s
Activated Clotting Time: 210 s
Activated Clotting Time: 181 s

## 2024-03-05 LAB — CBC
HCT: 29.3 % — ABNORMAL LOW (ref 39.0–52.0)
Hemoglobin: 9.7 g/dL — ABNORMAL LOW (ref 13.0–17.0)
MCH: 31.1 pg (ref 26.0–34.0)
MCHC: 33.1 g/dL (ref 30.0–36.0)
MCV: 93.9 fL (ref 80.0–100.0)
Platelets: 223 K/uL (ref 150–400)
RBC: 3.12 MIL/uL — ABNORMAL LOW (ref 4.22–5.81)
RDW: 12.6 % (ref 11.5–15.5)
WBC: 7.9 K/uL (ref 4.0–10.5)
nRBC: 0 % (ref 0.0–0.2)

## 2024-03-05 LAB — BASIC METABOLIC PANEL WITH GFR
Anion gap: 11 (ref 5–15)
BUN: 16 mg/dL (ref 6–20)
CO2: 21 mmol/L — ABNORMAL LOW (ref 22–32)
Calcium: 9 mg/dL (ref 8.9–10.3)
Chloride: 100 mmol/L (ref 98–111)
Creatinine, Ser: 0.77 mg/dL (ref 0.61–1.24)
GFR, Estimated: >60 mL/min (ref >60)
Glucose, Bld: 90 mg/dL (ref 70–99)
Potassium: 3.9 mmol/L (ref 3.5–5.1)
Sodium: 132 mmol/L — ABNORMAL LOW (ref 135–145)

## 2024-03-05 SURGERY — ABDOMINAL AORTOGRAM W/LOWER EXTREMITY
Anesthesia: LOCAL

## 2024-03-05 MED ORDER — OXYCODONE HCL 5 MG PO TABS
ORAL_TABLET | ORAL | Status: AC
Start: 2024-03-05 — End: 2024-03-05
  Filled 2024-03-05: qty 2

## 2024-03-05 MED ORDER — ACETAMINOPHEN 325 MG PO TABS
650.0000 mg | ORAL_TABLET | ORAL | Status: DC | PRN
  Administered 2024-03-09 – 2024-03-10 (×3): 650 mg via ORAL
  Filled 2024-03-05: qty 2

## 2024-03-05 MED ORDER — SODIUM CHLORIDE 0.9 % WEIGHT BASED INFUSION
1.0000 mL/kg/h | INTRAVENOUS | Status: AC
  Administered 2024-03-05 (×2): 1 mL/kg/h via INTRAVENOUS

## 2024-03-05 MED ORDER — MIDAZOLAM HCL 2 MG/2ML IJ SOLN
INTRAMUSCULAR | Status: AC
  Filled 2024-03-05: qty 2

## 2024-03-05 MED ORDER — LIDOCAINE HCL (PF) 1 % IJ SOLN
INTRAMUSCULAR | Status: AC
  Filled 2024-03-05: qty 30

## 2024-03-05 MED ORDER — HEPARIN SODIUM (PORCINE) 1000 UNIT/ML IJ SOLN
INTRAMUSCULAR | Status: AC
  Filled 2024-03-05: qty 10

## 2024-03-05 MED ORDER — GABAPENTIN 300 MG PO CAPS
300.0000 mg | ORAL_CAPSULE | Freq: Three times a day (TID) | ORAL | Status: DC
  Administered 2024-03-05 – 2024-03-09 (×20): 300 mg via ORAL
  Filled 2024-03-05 (×12): qty 1

## 2024-03-05 MED ORDER — SODIUM CHLORIDE 0.9 % IV SOLN: 250.0000 mL | INTRAVENOUS | Status: AC | PRN

## 2024-03-05 MED ORDER — HYDROMORPHONE HCL 1 MG/ML IJ SOLN
1.0000 mg | INTRAMUSCULAR | Status: DC | PRN
  Administered 2024-03-05 – 2024-03-08 (×25): 1 mg via INTRAVENOUS
  Filled 2024-03-05 (×15): qty 1

## 2024-03-05 MED ORDER — FENTANYL CITRATE (PF) 100 MCG/2ML IJ SOLN
INTRAMUSCULAR | Status: AC
Start: 2024-03-05 — End: 2024-03-05
  Filled 2024-03-05: qty 2

## 2024-03-05 MED ORDER — SODIUM CHLORIDE 0.9% FLUSH: 3.0000 mL | INTRAVENOUS | Status: DC | PRN

## 2024-03-05 MED ORDER — FENTANYL CITRATE (PF) 100 MCG/2ML IJ SOLN
INTRAMUSCULAR | Status: AC
  Filled 2024-03-05: qty 2

## 2024-03-05 MED ORDER — EZETIMIBE 10 MG PO TABS
10.0000 mg | ORAL_TABLET | Freq: Every day | ORAL | Status: DC
  Administered 2024-03-05 – 2024-03-10 (×7): 10 mg via ORAL
  Filled 2024-03-05 (×5): qty 1

## 2024-03-05 MED ORDER — MIDAZOLAM HCL 2 MG/2ML IJ SOLN
INTRAMUSCULAR | Status: DC | PRN
  Administered 2024-03-05 (×6): 1 mg via INTRAVENOUS

## 2024-03-05 MED ORDER — HEPARIN SODIUM (PORCINE) 1000 UNIT/ML IJ SOLN
INTRAMUSCULAR | Status: DC | PRN
  Administered 2024-03-05 (×2): 10000 [IU] via INTRAVENOUS

## 2024-03-05 MED ORDER — FENTANYL CITRATE (PF) 100 MCG/2ML IJ SOLN
INTRAMUSCULAR | Status: DC | PRN
  Administered 2024-03-05 (×2): 50 ug via INTRAVENOUS
  Administered 2024-03-05: 100 ug via INTRAVENOUS
  Administered 2024-03-05 (×3): 50 ug via INTRAVENOUS
  Administered 2024-03-05: 100 ug via INTRAVENOUS
  Administered 2024-03-05: 50 ug via INTRAVENOUS

## 2024-03-05 MED ORDER — HYDROMORPHONE HCL 1 MG/ML IJ SOLN
INTRAMUSCULAR | Status: AC
  Filled 2024-03-05: qty 0.5

## 2024-03-05 MED ORDER — HEPARIN (PORCINE) IN NACL 1000-0.9 UT/500ML-% IV SOLN
INTRAVENOUS | Status: DC | PRN
  Administered 2024-03-05 (×4): 500 mL

## 2024-03-05 MED ORDER — IODIXANOL 320 MG/ML IV SOLN
INTRAVENOUS | Status: DC | PRN
  Administered 2024-03-05 (×2): 100 mL

## 2024-03-05 MED ORDER — HYDROMORPHONE HCL 1 MG/ML IJ SOLN
INTRAMUSCULAR | Status: DC | PRN
  Administered 2024-03-05 (×2): 1 mg via INTRAVENOUS

## 2024-03-05 MED ORDER — HYDROMORPHONE HCL 1 MG/ML IJ SOLN: 0.5000 mg | INTRAMUSCULAR | Status: DC | PRN

## 2024-03-05 MED ORDER — LISINOPRIL 20 MG PO TABS
20.0000 mg | ORAL_TABLET | Freq: Every day | ORAL | Status: DC
  Administered 2024-03-05 – 2024-03-07 (×4): 20 mg via ORAL
  Filled 2024-03-05 (×3): qty 1

## 2024-03-05 MED ORDER — LIDOCAINE HCL (PF) 1 % IJ SOLN
INTRAMUSCULAR | Status: DC | PRN
  Administered 2024-03-05 (×2): 15 mL

## 2024-03-05 MED ORDER — HYDRALAZINE HCL 20 MG/ML IJ SOLN: 5.0000 mg | INTRAMUSCULAR | Status: DC | PRN

## 2024-03-05 MED ORDER — HEPARIN SODIUM (PORCINE) 5000 UNIT/ML IJ SOLN
5000.0000 [IU] | Freq: Three times a day (TID) | INTRAMUSCULAR | Status: DC
  Administered 2024-03-05 – 2024-03-06 (×4): 5000 [IU] via SUBCUTANEOUS
  Filled 2024-03-05 (×2): qty 1

## 2024-03-05 MED ORDER — SODIUM CHLORIDE 0.9% FLUSH
3.0000 mL | Freq: Two times a day (BID) | INTRAVENOUS | Status: DC
  Administered 2024-03-05 – 2024-03-06 (×4): 3 mL via INTRAVENOUS

## 2024-03-05 SURGICAL SUPPLY — 20 items
BALLOON MUSTANG 5X150X135 (BALLOONS) IMPLANT
CATH OMNI FLUSH 5F 65CM (CATHETERS) IMPLANT
CATH QUICKCROSS SUPP .035X90CM (MICROCATHETER) IMPLANT
CATH SHOCKWAVE E8 5X80 (CATHETERS) IMPLANT
GLIDEWIRE ADV .035X260CM (WIRE) IMPLANT
KIT ENCORE 26 ADVANTAGE (KITS) IMPLANT
KIT MICROPUNCTURE NIT STIFF (SHEATH) IMPLANT
KIT SINGLE USE MANIFOLD (KITS) IMPLANT
SET ATX-X65L (MISCELLANEOUS) IMPLANT
SHEATH CATAPULT 6F 45 MP (SHEATH) IMPLANT
SHEATH CATAPULT 7FR 45 (SHEATH) IMPLANT
SHEATH PINNACLE 5F 10CM (SHEATH) IMPLANT
SHEATH PINNACLE 6F 10CM (SHEATH) IMPLANT
SHEATH PROBE COVER 6X72 (BAG) IMPLANT
STENT ELUVIA 6X150X130 (Permanent Stent) IMPLANT
STENT VIABAHN 7X39 6FR 80 (Permanent Stent) IMPLANT
STENT VIABAHN 7X79 6FR 80 (Permanent Stent) IMPLANT
TRAY PV CATH (CUSTOM PROCEDURE TRAY) ×2 IMPLANT
WIRE BENTSON .035X145CM (WIRE) IMPLANT
WIRE SPARTACORE .014X300CM (WIRE) IMPLANT

## 2024-03-05 NOTE — Progress Notes (Signed)
 OT Cancellation Note  Patient Details Name: Todd [name] MRN: [id number] DOB: 26-Dec-1965   Cancelled Treatment:    Reason Eval/Treat Not Completed: Patient at procedure or test/ unavailable- will follow and see as able.   Etta NOVAK, OT Acute Rehabilitation Services Office [phone] Secure Chat Preferred    Etta GORMAN [place] 03/05/2024, 11:33 AM

## 2024-03-05 NOTE — Progress Notes (Signed)
 PHARMACIST LIPID MONITORING   Todd [name] is a 58 y.o. male admitted on 02/27/2024 with carotid artery stenosis, infrarenal AAA and left critical limb ischemia with left great toe gangrene.  Pharmacy has been consulted to optimize lipid-lowering therapy with the indication of secondary prevention for clinical ASCVD.  Recent Labs:  Lipid Panel (last 6 months):   Lab Results  Component Value Date   CHOL 192 02/28/2024   TRIG 114 02/28/2024   HDL 45 02/28/2024   CHOLHDL 4.3 02/28/2024   VLDL 23 02/28/2024   LDLCALC 124 (H) 02/28/2024    Hepatic function panel (last 6 months):   Lab Results  Component Value Date   AST 44 (H) 02/27/2024   ALT 32 02/27/2024   ALKPHOS 109 02/27/2024   BILITOT 0.6 02/27/2024    SCr (since admission):   Serum creatinine: 0.77 mg/dL 91/88/74 9567 Estimated creatinine clearance: 128 mL/min  Current therapy and lipid therapy tolerance Current lipid-lowering therapy: Lipitor 40 Previous lipid-lowering therapies (if applicable): NA Documented or reported allergies or intolerances to lipid-lowering therapies (if applicable): None  Assessment:   Patient agrees with changes to lipid-lowering therapy  Plan:    1.Statin intensity (high intensity recommended for all patients regardless of the LDL):  No statin changes. The patient is already on a high intensity statin.  2.Add ezetimibe  (if any one of the following):   On a high intensity statin with LDL > 70.  3.Refer to lipid clinic:   No  4.Follow-up with:  Cardiology provider - None  5.Follow-up labs after discharge:  Changes in lipid therapy were made. Check a lipid panel in 8-12 weeks then annually.       Donny Alert, PharmD, [facility] Clinical Pharmacist Please see AMION for all Pharmacists' Contact Phone Numbers 03/05/2024, 8:18 AM
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
## 2024-03-05 NOTE — Progress Notes (Signed)
 Site area: 46F arterial sheath Site Prior to Removal:  Level 1, bruising present  Pressure Applied For: 45 minutes Manual:   yes Patient Status During Pull:  stable Post Pull Site:  Level 1w/bruising. Hematoma softened Post Pull Instructions Given:  yes Post Pull Pulses Present: rt PT and DP dopplered Dressing Applied:  gauze and tegaderm Bedrest begins @ 1340 Comments: Dr. Massie of last ACT 181 at 1238; permission received to pull sheath

## 2024-03-05 NOTE — Op Note (Signed)
 Patient name: Todd [name] MRN: [id number] DOB: 13-Nov-1965 Sex: male  03/05/2024 Pre-operative Diagnosis: Abdominal aortic aneurysm, chronic left lower extremity limb threatening ischemia with left great toe gangrene Post-operative diagnosis:  Same Surgeon:  Penne BROCKS. Sheree, MD Procedure Performed: 1.  Percutaneous ultrasound-guided cannulation right common femoral artery 2.  Catheter aortic and aortogram with bilateral lower extremity angiography 3.  Catheter selection of left popliteal artery 4.  Shockwave lithotripsy left SFA with 5 mm balloon for total of 240 pulses using 5 x 80 mm balloon 5.  Stent left SFA with 6 x 150 Eluvia postdilated with 5 mm balloon 6.  Stent of left common iliac artery with 7 x 79 mm VBX 7.  Stent ofright common iliac artery 7 x 39 mm VBX 8.  Moderate sedation with fentanyl  and Versed  for 83 minutes   Indications: 58 year old male presented with left ICA stenosis with left hemispheric stroke is undergone left-sided transcarotid artery revascularization.  He also has known abdominal aortic aneurysm now measuring 5.2 cm and now has gangrene of the left great toe with severely depressed ABI and evidence of aortoiliac occlusive disease as well as lower extremity occlusive disease.  He is indicated for angiography and possible invention.  Findings: Aortic aneurysm was evaluated and there appears to be approximately 19 mm neck from the lowest left renal artery to the beginning of the aneurysm.  Will plan endovascular aneurysm repair for this.  The right common neck artery was heavily calcified with approximately 70% stenosis was stented to 0% residual stenosis.  The left common artery was subtotally occluded 0% residual stenosis subsequently there was a palpable common femoral pulse in the left.  The SFA distally was very diminutive into the adductor canal and was subtotally occluded and was reduced to 0% residual stenosis that was much larger after stenting.  The  trifurcation below the knee is patent in the anterior tibial artery that occludes.  Dominant runoff is via the posterior tibial artery there is a diminutive peroneal artery as well.  Posterior tibial artery does fill the foot generously.  Plan will be for endovascular aneurysm repair to both exclude the aneurysm and improve lower extremity blood flow and bridged to the bilateral common iliac artery stents.   Procedure:  The patient was identified in the holding area and taken to room 8.  The patient was then placed supine on the table and prepped and draped in the usual sterile fashion.  A time out was called.  Ultrasound was used to evaluate the right common femoral artery.  This was heavily diseased but we did find it healthy area more cephalad ureter was anesthetized 1% lidocaine  containing questionable blood wire and sheath.  Ultrasound images saved the permanent record.  We placed a Bentson wire followed by 5 French sheath and Omni catheter was placed at the level of L1.  Using 15 degrees of craniocaudal direction we then performed angiogram which demonstrated the most left renal artery to be patent as well as the right renal arteries patent there appeared to be a 19 mm aneurysm.  We then pulled the catheter down to the aortic bifurcation and an AP projection there appeared to be high-grade stenosis of the right and subtotal occlusion of the left common iliac arteries.  We then crossed the bifurcation using a Bentson wire and Omni catheter perform left lower extremity angiography with the above findings.  We then placed the Glidewire advantage and a 6 French sheath in the left SFA  and the patient was fully heparinized.  We cross the subtotal occluded left SFA confirmed intraluminal access placed at 014 wire and began with shockwave lithotripsy in 2 areas of the distal SFA with a 5 x 80 mm balloon at 2 atm completion demonstrated dissection distally and in the midsegment we elected to stent.  Over quick  cross catheter placed at 035 wire and then primarily stented the SFA and postdilated with 5 mm balloon and there was no residual stenosis or dissection.  Satisfied with this we turned our attention to the common iliac arteries.  We retracted the 6 French sheath over the wire back into the right iliac artery perform angiography.  With the above findings we elected stenting of the common iliac arteries.  We then placed a 6 French sheath up and over the bifurcation of the primarily stented the left common iliac artery with 7 x 79 mm VBX.  We then performed retrograde angiography of the right identified the hypogastric on the right and then primarily stented this with a 7 x 39 mm VBX.  We will plan to bridge the EVAR down into these 2 stents which can be postdilated at the time of surgery.  After completion angiography we exchanged for a short 6 French sheath on the right with postoperative holding.  Patient tolerated the procedure without any complication.   Contrast: 100 cc  Shanyiah Conde C. Sheree, MD [facility] [place] Office: [phone] Pager: [phone]

## 2024-03-05 NOTE — Plan of Care (Signed)
  Problem: Education: Goal: Knowledge of disease or condition will improve Outcome: Progressing Goal: Knowledge of secondary prevention will improve (MUST DOCUMENT ALL) Outcome: Progressing Goal: Knowledge of patient specific risk factors will improve (DELETE if not current risk factor) Outcome: Progressing   Problem: Ischemic Stroke/TIA Tissue Perfusion: Goal: Complications of ischemic stroke/TIA will be minimized Outcome: Progressing

## 2024-03-05 NOTE — Progress Notes (Addendum)
  Progress Note    03/05/2024 9:34 AM   Subjective: Stabbing pain in left great toe  Vitals:   03/05/24 0442 03/05/24 0819  BP: 132/81 (!) 110/97  Pulse: 76   Resp: 10 17  Temp: (!) 97.4 F (36.3 C) 97.6 F (36.4 C)  SpO2: 98%     Physical Exam: Awake alert oriented On the respirations Left neck incision healing well Left great toe with stable gangrene  CBC    Component Value Date/Time   WBC 8.5 03/03/2024 0500   RBC 3.65 (L) 03/03/2024 0500   HGB 11.5 (L) 03/03/2024 0500   HGB 13.7 10/06/2023 1530   HCT 34.4 (L) 03/03/2024 0500   HCT 40.7 10/06/2023 1530   PLT 281 03/03/2024 0500   PLT 231 10/06/2023 1530   MCV 94.2 03/03/2024 0500   MCV 97 10/06/2023 1530   MCH 31.5 03/03/2024 0500   MCHC 33.4 03/03/2024 0500   RDW 12.4 03/03/2024 0500   RDW 12.5 10/06/2023 1530   LYMPHSABS 1.8 02/27/2024 1426   LYMPHSABS 2.6 10/06/2023 1530   MONOABS 0.9 02/27/2024 1426   EOSABS 0.0 02/27/2024 1426   EOSABS 0.3 10/06/2023 1530   BASOSABS 0.1 02/27/2024 1426   BASOSABS 0.1 10/06/2023 1530    BMET    Component Value Date/Time   NA 132 (L) 03/05/2024 0432   NA 136 10/06/2023 1530   K 3.9 03/05/2024 0432   CL 100 03/05/2024 0432   CO2 21 (L) 03/05/2024 0432   GLUCOSE 90 03/05/2024 0432   BUN 16 03/05/2024 0432   BUN 16 10/06/2023 1530   CREATININE 0.77 03/05/2024 0432   CALCIUM  9.0 03/05/2024 0432   GFRNONAA >60 03/05/2024 0432   GFRAA 112 02/16/2018 0939    INR    Component Value Date/Time   INR 1.4 (H) 02/27/2024 1532    No intake or output data in the 24 hours ending 03/05/24 0934  Assessment and plan: 58 year old male status post left TCAR for symptomatic disease recovering well.   Left great toe gangrene Scheduled for Aortogram, Arteriogram of LLE today in [facility] lab with Dr. Sheree His questions regarding procedure today were answered Keep NPO Consent ordered   Teretha Charlyne RIGGERS [facility] [phone] 03/05/2024 7:22  AM  I have independently interviewed and examined patient and agree with PA assessment and plan above.   Maryelizabeth Eberle C. Sheree, MD [facility] [place] Office: [phone] Pager: [phone]

## 2024-03-06 ENCOUNTER — Inpatient Hospital Stay (HOSPITAL_COMMUNITY): Admitting: Anesthesiology

## 2024-03-06 ENCOUNTER — Inpatient Hospital Stay (HOSPITAL_COMMUNITY)

## 2024-03-06 ENCOUNTER — Other Ambulatory Visit: Payer: Self-pay

## 2024-03-06 ENCOUNTER — Encounter (HOSPITAL_COMMUNITY): Payer: Self-pay | Admitting: Internal Medicine

## 2024-03-06 ENCOUNTER — Encounter (HOSPITAL_COMMUNITY)
Admission: EM | Disposition: A | Discharge status: Home Health | Payer: Self-pay | Source: Home / Self Care | Attending: Student

## 2024-03-06 DIAGNOSIS — I701 Atherosclerosis of renal artery: Secondary | ICD-10-CM

## 2024-03-06 DIAGNOSIS — Z95820 Peripheral vascular angioplasty status with implants and grafts: Secondary | ICD-10-CM | POA: Diagnosis not present

## 2024-03-06 DIAGNOSIS — I63232 Cerebral infarction due to unspecified occlusion or stenosis of left carotid arteries: Secondary | ICD-10-CM | POA: Diagnosis not present

## 2024-03-06 DIAGNOSIS — I70262 Atherosclerosis of native arteries of extremities with gangrene, left leg: Secondary | ICD-10-CM | POA: Diagnosis not present

## 2024-03-06 DIAGNOSIS — I739 Peripheral vascular disease, unspecified: Secondary | ICD-10-CM | POA: Diagnosis not present

## 2024-03-06 DIAGNOSIS — I714 Abdominal aortic aneurysm, without rupture, unspecified: Secondary | ICD-10-CM | POA: Diagnosis not present

## 2024-03-06 DIAGNOSIS — R911 Solitary pulmonary nodule: Secondary | ICD-10-CM | POA: Diagnosis not present

## 2024-03-06 DIAGNOSIS — I700 Atherosclerosis of aorta: Secondary | ICD-10-CM | POA: Diagnosis not present

## 2024-03-06 DIAGNOSIS — I7143 Infrarenal abdominal aortic aneurysm, without rupture: Secondary | ICD-10-CM | POA: Diagnosis not present

## 2024-03-06 HISTORY — PX: ULTRASOUND GUIDANCE FOR VASCULAR ACCESS: SHX6516

## 2024-03-06 HISTORY — PX: ENDARTERECTOMY FEMORAL: SHX5804

## 2024-03-06 HISTORY — PX: ABDOMINAL AORTIC ENDOVASCULAR STENT GRAFT: SHX5707

## 2024-03-06 HISTORY — PX: PATCH ANGIOPLASTY: SHX6230

## 2024-03-06 HISTORY — PX: AMPUTATION: SHX166

## 2024-03-06 LAB — CBC
HCT: 29 % — ABNORMAL LOW (ref 39.0–52.0)
HCT: 31.2 % — ABNORMAL LOW (ref 39.0–52.0)
Hemoglobin: 9.8 g/dL — ABNORMAL LOW (ref 13.0–17.0)
Hemoglobin: 10.5 g/dL — ABNORMAL LOW (ref 13.0–17.0)
MCH: 32.2 pg (ref 26.0–34.0)
MCH: 28.8 pg (ref 26.0–34.0)
MCHC: 33.8 g/dL (ref 30.0–36.0)
MCHC: 33.7 g/dL (ref 30.0–36.0)
MCV: 95.4 fL (ref 80.0–100.0)
MCV: 85.7 fL (ref 80.0–100.0)
Platelets: 226 K/uL (ref 150–400)
Platelets: 147 K/uL — ABNORMAL LOW (ref 150–400)
RBC: 3.04 MIL/uL — ABNORMAL LOW (ref 4.22–5.81)
RBC: 3.64 MIL/uL — ABNORMAL LOW (ref 4.22–5.81)
RDW: 12.5 % (ref 11.5–15.5)
RDW: 17.6 % — ABNORMAL HIGH (ref 11.5–15.5)
WBC: 8 K/uL (ref 4.0–10.5)
WBC: 10.6 K/uL — ABNORMAL HIGH (ref 4.0–10.5)
nRBC: 0 % (ref 0.0–0.2)
nRBC: 0.3 % — ABNORMAL HIGH (ref 0.0–0.2)

## 2024-03-06 LAB — BASIC METABOLIC PANEL WITH GFR
Anion gap: 8 (ref 5–15)
Anion gap: 11 (ref 5–15)
BUN: 14 mg/dL (ref 6–20)
BUN: 14 mg/dL (ref 6–20)
CO2: 21 mmol/L — ABNORMAL LOW (ref 22–32)
CO2: 19 mmol/L — ABNORMAL LOW (ref 22–32)
Calcium: 8.7 mg/dL — ABNORMAL LOW (ref 8.9–10.3)
Calcium: 7.9 mg/dL — ABNORMAL LOW (ref 8.9–10.3)
Chloride: 104 mmol/L (ref 98–111)
Chloride: 103 mmol/L (ref 98–111)
Creatinine, Ser: 0.82 mg/dL (ref 0.61–1.24)
Creatinine, Ser: 0.84 mg/dL (ref 0.61–1.24)
GFR, Estimated: >60 mL/min (ref >60)
GFR, Estimated: >60 mL/min (ref >60)
Glucose, Bld: 96 mg/dL (ref 70–99)
Glucose, Bld: 153 mg/dL — ABNORMAL HIGH (ref 70–99)
Potassium: 4.1 mmol/L (ref 3.5–5.1)
Potassium: 4 mmol/L (ref 3.5–5.1)
Sodium: 133 mmol/L — ABNORMAL LOW (ref 135–145)
Sodium: 133 mmol/L — ABNORMAL LOW (ref 135–145)

## 2024-03-06 LAB — PROTIME-INR
INR: 1.6 — ABNORMAL HIGH (ref 0.8–1.2)
Prothrombin Time: 19.6 s — ABNORMAL HIGH (ref 11.4–15.2)

## 2024-03-06 LAB — APTT: aPTT: 36 s (ref 24–36)

## 2024-03-06 LAB — POCT I-STAT 7, (LYTES, BLD GAS, ICA,H+H)
Acid-base deficit: 5 mmol/L — ABNORMAL HIGH (ref 0.0–2.0)
Bicarbonate: 20.7 mmol/L (ref 20.0–28.0)
Calcium, Ion: 1.16 mmol/L (ref 1.15–1.40)
HCT: 19 % — ABNORMAL LOW (ref 39.0–52.0)
Hemoglobin: 6.5 g/dL — CL (ref 13.0–17.0)
O2 Saturation: 99 %
Patient temperature: 36.5
Potassium: 4.2 mmol/L (ref 3.5–5.1)
Sodium: 135 mmol/L (ref 135–145)
TCO2: 22 mmol/L (ref 22–32)
pCO2 arterial: 37.4 mmHg (ref 32–48)
pH, Arterial: 7.35 (ref 7.35–7.45)
pO2, Arterial: 156 mmHg — ABNORMAL HIGH (ref 83–108)

## 2024-03-06 LAB — MAGNESIUM: Magnesium: 1.8 mg/dL (ref 1.7–2.4)

## 2024-03-06 LAB — PREPARE RBC (CROSSMATCH)

## 2024-03-06 LAB — POCT ACTIVATED CLOTTING TIME: Activated Clotting Time: 245 s

## 2024-03-06 SURGERY — INSERTION, ENDOVASCULAR STENT GRAFT, AORTA, ABDOMINAL
Anesthesia: General | Site: Groin

## 2024-03-06 MED ORDER — MIDAZOLAM HCL 2 MG/2ML IJ SOLN
INTRAMUSCULAR | Status: DC | PRN
  Administered 2024-03-06 (×2): 2 mg via INTRAVENOUS

## 2024-03-06 MED ORDER — HYDROMORPHONE HCL 1 MG/ML IJ SOLN
0.5000 mg | Freq: Once | INTRAMUSCULAR | Status: AC
  Administered 2024-03-06 (×2): 0.5 mg via INTRAVENOUS
  Filled 2024-03-06: qty 0.5

## 2024-03-06 MED ORDER — ROCURONIUM BROMIDE 10 MG/ML (PF) SYRINGE
PREFILLED_SYRINGE | INTRAVENOUS | Status: AC
  Filled 2024-03-06: qty 10

## 2024-03-06 MED ORDER — PHENYLEPHRINE HCL-NACL 20-0.9 MG/250ML-% IV SOLN
INTRAVENOUS | Status: DC | PRN
  Administered 2024-03-06 (×3): 160 ug via INTRAVENOUS
  Administered 2024-03-06: 20 ug/min via INTRAVENOUS
  Administered 2024-03-06 (×3): 160 ug via INTRAVENOUS
  Administered 2024-03-06: 20 ug/min via INTRAVENOUS

## 2024-03-06 MED ORDER — POTASSIUM CHLORIDE CRYS ER 20 MEQ PO TBCR: 40.0000 meq | EXTENDED_RELEASE_TABLET | Freq: Every day | ORAL | Status: DC | PRN

## 2024-03-06 MED ORDER — CHLORHEXIDINE GLUCONATE 0.12 % MT SOLN: 15.0000 mL | Freq: Once | OROMUCOSAL | Status: AC

## 2024-03-06 MED ORDER — IODIXANOL 320 MG/ML IV SOLN
INTRAVENOUS | Status: DC | PRN
  Administered 2024-03-06: 60 mL
  Administered 2024-03-06 (×2): 65 mL via INTRA_ARTERIAL
  Administered 2024-03-06: 60 mL

## 2024-03-06 MED ORDER — ONDANSETRON HCL 4 MG/2ML IJ SOLN: 4.0000 mg | Freq: Once | INTRAMUSCULAR | Status: DC | PRN

## 2024-03-06 MED ORDER — PROPOFOL 10 MG/ML IV BOLUS
INTRAVENOUS | Status: AC
Start: 2024-03-06 — End: 2024-03-06
  Filled 2024-03-06: qty 20

## 2024-03-06 MED ORDER — OXYCODONE HCL 5 MG PO TABS: 5.0000 mg | ORAL_TABLET | Freq: Once | ORAL | Status: DC | PRN

## 2024-03-06 MED ORDER — FENTANYL CITRATE (PF) 250 MCG/5ML IJ SOLN
INTRAMUSCULAR | Status: AC
  Filled 2024-03-06: qty 5

## 2024-03-06 MED ORDER — CEFAZOLIN SODIUM-DEXTROSE 2-3 GM-%(50ML) IV SOLR
INTRAVENOUS | Status: DC | PRN
  Administered 2024-03-06 (×2): 2 g via INTRAVENOUS

## 2024-03-06 MED ORDER — LACTATED RINGERS IV SOLN: INTRAVENOUS | Status: DC

## 2024-03-06 MED ORDER — 0.9 % SODIUM CHLORIDE (POUR BTL) OPTIME
TOPICAL | Status: DC | PRN
Start: 2024-03-06 — End: 2024-03-06
  Administered 2024-03-06 (×2): 1000 mL

## 2024-03-06 MED ORDER — HEMOSTATIC AGENTS (NO CHARGE) OPTIME
TOPICAL | Status: DC | PRN
Start: 2024-03-06 — End: 2024-03-06
  Administered 2024-03-06 (×2): 1 via TOPICAL

## 2024-03-06 MED ORDER — ACETAMINOPHEN 10 MG/ML IV SOLN
INTRAVENOUS | Status: AC
  Filled 2024-03-06: qty 100

## 2024-03-06 MED ORDER — ACETAMINOPHEN 10 MG/ML IV SOLN
1000.0000 mg | Freq: Once | INTRAVENOUS | Status: DC | PRN
Start: 2024-03-06 — End: 2024-03-06

## 2024-03-06 MED ORDER — MIDAZOLAM HCL 2 MG/2ML IJ SOLN
INTRAMUSCULAR | Status: AC
  Filled 2024-03-06: qty 2

## 2024-03-06 MED ORDER — LIDOCAINE 2% (20 MG/ML) 5 ML SYRINGE
INTRAMUSCULAR | Status: DC | PRN
  Administered 2024-03-06 (×2): 60 mg via INTRAVENOUS

## 2024-03-06 MED ORDER — SODIUM CHLORIDE 0.9 % IV SOLN: INTRAVENOUS | Status: DC | PRN

## 2024-03-06 MED ORDER — ALBUMIN HUMAN 5 % IV SOLN
INTRAVENOUS | Status: DC | PRN
Start: 2024-03-06 — End: 2024-03-07

## 2024-03-06 MED ORDER — SODIUM CHLORIDE 0.9 % IV SOLN: INTRAVENOUS | Status: AC

## 2024-03-06 MED ORDER — FENTANYL CITRATE (PF) 100 MCG/2ML IJ SOLN: 25.0000 ug | INTRAMUSCULAR | Status: DC | PRN

## 2024-03-06 MED ORDER — ONDANSETRON HCL 4 MG/2ML IJ SOLN
INTRAMUSCULAR | Status: AC
  Filled 2024-03-06: qty 2

## 2024-03-06 MED ORDER — HEPARIN SODIUM (PORCINE) 1000 UNIT/ML IJ SOLN
INTRAMUSCULAR | Status: DC | PRN
  Administered 2024-03-06 (×2): 5000 [IU] via INTRAVENOUS
  Administered 2024-03-06: 10000 [IU] via INTRAVENOUS
  Administered 2024-03-06 (×2): 5000 [IU] via INTRAVENOUS
  Administered 2024-03-06: 10000 [IU] via INTRAVENOUS

## 2024-03-06 MED ORDER — CHLORHEXIDINE GLUCONATE 0.12 % MT SOLN
OROMUCOSAL | Status: AC
  Administered 2024-03-06 (×2): 15 mL via OROMUCOSAL
  Filled 2024-03-06: qty 15

## 2024-03-06 MED ORDER — LIDOCAINE 2% (20 MG/ML) 5 ML SYRINGE
INTRAMUSCULAR | Status: AC
Start: 2024-03-06 — End: 2024-03-06
  Filled 2024-03-06: qty 5

## 2024-03-06 MED ORDER — HEPARIN SODIUM (PORCINE) 5000 UNIT/ML IJ SOLN
5000.0000 [IU] | Freq: Three times a day (TID) | INTRAMUSCULAR | Status: DC
  Administered 2024-03-07 – 2024-03-10 (×13): 5000 [IU] via SUBCUTANEOUS
  Filled 2024-03-06 (×10): qty 1

## 2024-03-06 MED ORDER — OXYCODONE HCL 5 MG/5ML PO SOLN: 5.0000 mg | Freq: Once | ORAL | Status: DC | PRN

## 2024-03-06 MED ORDER — PROTAMINE SULFATE 10 MG/ML IV SOLN
INTRAVENOUS | Status: DC | PRN
  Administered 2024-03-06 (×10): 10 mg via INTRAVENOUS

## 2024-03-06 MED ORDER — CEFAZOLIN SODIUM-DEXTROSE 2-4 GM/100ML-% IV SOLN
2.0000 g | Freq: Three times a day (TID) | INTRAVENOUS | Status: AC
  Administered 2024-03-06 – 2024-03-07 (×4): 2 g via INTRAVENOUS
  Filled 2024-03-06 (×2): qty 100

## 2024-03-06 MED ORDER — SUGAMMADEX SODIUM 200 MG/2ML IV SOLN
INTRAVENOUS | Status: DC | PRN
  Administered 2024-03-06 (×2): 200 mg via INTRAVENOUS

## 2024-03-06 MED ORDER — DEXAMETHASONE SODIUM PHOSPHATE 10 MG/ML IJ SOLN
INTRAMUSCULAR | Status: DC | PRN
  Administered 2024-03-06 (×2): 10 mg via INTRAVENOUS

## 2024-03-06 MED ORDER — ORAL CARE MOUTH RINSE: 15.0000 mL | Freq: Once | OROMUCOSAL | Status: AC

## 2024-03-06 MED ORDER — FENTANYL CITRATE (PF) 250 MCG/5ML IJ SOLN
INTRAMUSCULAR | Status: DC | PRN
  Administered 2024-03-06 (×18): 50 ug via INTRAVENOUS

## 2024-03-06 MED ORDER — HEPARIN 6000 UNIT IRRIGATION SOLUTION
Status: DC | PRN
  Administered 2024-03-06 (×4): 1

## 2024-03-06 MED ORDER — PANTOPRAZOLE SODIUM 40 MG PO TBEC
40.0000 mg | DELAYED_RELEASE_TABLET | Freq: Every day | ORAL | Status: DC
  Administered 2024-03-06 – 2024-03-10 (×7): 40 mg via ORAL
  Filled 2024-03-06 (×5): qty 1

## 2024-03-06 MED ORDER — EPHEDRINE 5 MG/ML INJ
INTRAVENOUS | Status: AC
  Filled 2024-03-06: qty 5

## 2024-03-06 MED ORDER — PROPOFOL 10 MG/ML IV BOLUS
INTRAVENOUS | Status: DC | PRN
  Administered 2024-03-06 (×2): 100 mg via INTRAVENOUS

## 2024-03-06 MED ORDER — ACETAMINOPHEN 10 MG/ML IV SOLN
INTRAVENOUS | Status: DC | PRN
Start: 2024-03-06 — End: 2024-03-07
  Administered 2024-03-06: 1000 mg via INTRAVENOUS

## 2024-03-06 MED ORDER — ROCURONIUM BROMIDE 10 MG/ML (PF) SYRINGE
PREFILLED_SYRINGE | INTRAVENOUS | Status: DC | PRN
  Administered 2024-03-06: 30 mg via INTRAVENOUS
  Administered 2024-03-06 (×2): 20 mg via INTRAVENOUS
  Administered 2024-03-06 (×2): 50 mg via INTRAVENOUS
  Administered 2024-03-06 (×2): 20 mg via INTRAVENOUS
  Administered 2024-03-06: 30 mg via INTRAVENOUS

## 2024-03-06 MED ORDER — DEXAMETHASONE SODIUM PHOSPHATE 10 MG/ML IJ SOLN
INTRAMUSCULAR | Status: AC
  Filled 2024-03-06: qty 1

## 2024-03-06 SURGICAL SUPPLY — 94 items
BAG COUNTER SPONGE SURGICOUNT (BAG) ×4 IMPLANT
BAG DECANTER FOR FLEXI CONT (MISCELLANEOUS) IMPLANT
BALLOON ATLAS 14X40X75 (BALLOONS) IMPLANT
BALLOON MUSTANG 5X80X75 (BALLOONS) IMPLANT
BLADE AVERAGE 25X9 (BLADE) ×4 IMPLANT
BLADE CLIPPER SURG (BLADE) ×4 IMPLANT
BLADE SAW SGTL 81X20 HD (BLADE) IMPLANT
BNDG ELASTIC 4INX 5YD STR LF (GAUZE/BANDAGES/DRESSINGS) IMPLANT
BNDG ELASTIC 4X5.8 VLCR STR LF (GAUZE/BANDAGES/DRESSINGS) ×4 IMPLANT
BNDG GAUZE DERMACEA FLUFF 4 (GAUZE/BANDAGES/DRESSINGS) ×4 IMPLANT
BNDG STRETCH GAUZE 3IN X12FT (GAUZE/BANDAGES/DRESSINGS) IMPLANT
CANISTER SUCTION 3000ML PPV (SUCTIONS) ×4 IMPLANT
CATH BEACON 5.038 65CM KMP-01 (CATHETERS) ×4 IMPLANT
CATH EMB 4FR 40 (CATHETERS) IMPLANT
CATH OMNI FLUSH .035X70CM (CATHETERS) ×4 IMPLANT
CATH QUICKCROSS SUPP .035X90CM (MICROCATHETER) IMPLANT
CLIP LIGATING EXTRA MED SLVR (CLIP) ×12 IMPLANT
CLIP LIGATING EXTRA SM BLUE (MISCELLANEOUS) ×4 IMPLANT
COVER SURGICAL LIGHT HANDLE (MISCELLANEOUS) ×4 IMPLANT
DERMABOND ADVANCED .7 DNX12 (GAUZE/BANDAGES/DRESSINGS) ×4 IMPLANT
DEVICE CLOSURE PERCLS PRGLD 6F (VASCULAR PRODUCTS) ×16 IMPLANT
DEVICE TORQUE KENDALL .025-038 (MISCELLANEOUS) IMPLANT
DRAPE HALF SHEET 40X57 (DRAPES) ×4 IMPLANT
DRSG TEGADERM 2-3/8X2-3/4 SM (GAUZE/BANDAGES/DRESSINGS) ×8 IMPLANT
ELECTRODE REM PT RTRN 9FT ADLT (ELECTROSURGICAL) ×8 IMPLANT
EXCLUDER TNK 28.5X14.5X12 16F (Endovascular Graft) IMPLANT
EXTENDER ENDOPROSTHESIS 10X7 (Endovascular Graft) IMPLANT
GAUZE 4X4 16PLY ~~LOC~~+RFID DBL (SPONGE) ×4 IMPLANT
GAUZE SPONGE 2X2 8PLY STRL LF (GAUZE/BANDAGES/DRESSINGS) ×8 IMPLANT
GAUZE SPONGE 4X4 12PLY STRL (GAUZE/BANDAGES/DRESSINGS) ×4 IMPLANT
GAUZE SPONGE 4X4 12PLY STRL LF (GAUZE/BANDAGES/DRESSINGS) IMPLANT
GLIDEWIRE ADV .035X180CM (WIRE) ×4 IMPLANT
GLOVE BIO SURGEON STRL SZ7.5 (GLOVE) ×4 IMPLANT
GOWN STRL REUS W/ TWL LRG LVL3 (GOWN DISPOSABLE) ×8 IMPLANT
GOWN STRL REUS W/ TWL XL LVL3 (GOWN DISPOSABLE) ×8 IMPLANT
GRAFT BALLN CATH 65CM (BALLOONS) ×4 IMPLANT
GRAFT VASC PATCH XENOSURE 1X14 (Vascular Products) IMPLANT
GUIDEWIRE ANGLED .035X260CM (WIRE) IMPLANT
KIT BASIN OR (CUSTOM PROCEDURE TRAY) ×4 IMPLANT
KIT DRAIN CSF ACCUDRAIN (MISCELLANEOUS) IMPLANT
KIT ENCORE 26 ADVANTAGE (KITS) IMPLANT
KIT TURNOVER KIT B (KITS) ×4 IMPLANT
LOOP VASCLR MAXI BLUE 18IN ST (MISCELLANEOUS) IMPLANT
LOOP VESSEL MINI RED (MISCELLANEOUS) IMPLANT
LOOPS VASCLR MAXI BLUE 18IN ST (MISCELLANEOUS) ×4 IMPLANT
NDL HYPO 25GX1X1/2 BEV (NEEDLE) IMPLANT
NEEDLE HYPO 25GX1X1/2 BEV (NEEDLE) IMPLANT
NS IRRIG 1000ML POUR BTL (IV SOLUTION) ×4 IMPLANT
PACK ENDOVASCULAR (PACKS) ×4 IMPLANT
PACK GENERAL/GYN (CUSTOM PROCEDURE TRAY) ×4 IMPLANT
PAD ARMBOARD POSITIONER FOAM (MISCELLANEOUS) ×8 IMPLANT
PENCIL SMOKE EVACUATOR (MISCELLANEOUS) IMPLANT
POWDER SURGICEL 3.0 GRAM (HEMOSTASIS) IMPLANT
SET MICROPUNCTURE 5F STIFF (MISCELLANEOUS) ×4 IMPLANT
SHEATH APTUS 6.5FR 45CM (SHEATH) IMPLANT
SHEATH BRITE TIP 8FR 23CM (SHEATH) ×4 IMPLANT
SHEATH DRYSEAL FLEX 12FR 33CM (SHEATH) IMPLANT
SHEATH MICROPUNCTURE PEDAL 4FR (SHEATH) IMPLANT
SHEATH PINNACLE 5F 10CM (SHEATH) IMPLANT
SHEATH PINNACLE 6F 10CM (SHEATH) IMPLANT
SHEATH PINNACLE 8F 10CM (SHEATH) ×4 IMPLANT
SPECIMEN JAR SMALL (MISCELLANEOUS) ×4 IMPLANT
SPONGE INTESTINAL PEANUT (DISPOSABLE) IMPLANT
SPONGE T-LAP 18X18 ~~LOC~~+RFID (SPONGE) ×8 IMPLANT
STENT ELUVIA 6X120X130 (Permanent Stent) IMPLANT
STENT ELUVIA 6X150X130 (Permanent Stent) IMPLANT
STENT ELUVIA 7X60X130 (Permanent Stent) IMPLANT
STENT VIABAHN 11X59X135 (Permanent Stent) IMPLANT
STENT VIABAHN 6X15 6FR 135 (Permanent Stent) IMPLANT
STENT VIABAHN 8X79 8FR 135 (Permanent Stent) IMPLANT
STOPCOCK MORSE 400PSI 3WAY (MISCELLANEOUS) ×4 IMPLANT
SUT ETHILON 3 0 PS 1 (SUTURE) ×4 IMPLANT
SUT MNCRL AB 4-0 PS2 18 (SUTURE) ×8 IMPLANT
SUT PROLENE 5 0 C 1 24 (SUTURE) IMPLANT
SUT PROLENE 5 0 C 1 36 (SUTURE) IMPLANT
SUT PROLENE 6 0 BV (SUTURE) IMPLANT
SUT SILK 2-0 18XBRD TIE 12 (SUTURE) IMPLANT
SUT SILK 3-0 18XBRD TIE 12 (SUTURE) IMPLANT
SUT SILK 4-0 18XBRD TIE 12 (SUTURE) IMPLANT
SUT VIC AB 2-0 CT1 TAPERPNT 27 (SUTURE) IMPLANT
SUT VIC AB 3-0 SH 27X BRD (SUTURE) IMPLANT
SYR 20ML LL LF (SYRINGE) ×4 IMPLANT
SYR BULB IRRIG 60ML STRL (SYRINGE) IMPLANT
SYR CONTROL 10ML LL (SYRINGE) IMPLANT
TOWEL GREEN STERILE (TOWEL DISPOSABLE) ×8 IMPLANT
TOWEL GREEN STERILE FF (TOWEL DISPOSABLE) ×4 IMPLANT
TRAY FOLEY MTR SLVR 16FR STAT (SET/KITS/TRAYS/PACK) ×4 IMPLANT
TUBING INJECTOR 48 (MISCELLANEOUS) ×4 IMPLANT
UNDERPAD 30X36 HEAVY ABSORB (UNDERPADS AND DIAPERS) ×4 IMPLANT
WATER STERILE IRR 1000ML POUR (IV SOLUTION) ×4 IMPLANT
WIRE AMPLATZ SS-J .035X180CM (WIRE) ×4 IMPLANT
WIRE BENTSON .035X145CM (WIRE) ×8 IMPLANT
WIRE ROSEN-J .035X260CM (WIRE) IMPLANT
WIRE TORQFLEX AUST .018X40CM (WIRE) IMPLANT

## 2024-03-06 NOTE — Progress Notes (Signed)
 OT Cancellation Note  Patient Details Name: Todd [name] MRN: [id number] DOB: 04-15-1966   Cancelled Treatment:    Reason Eval/Treat Not Completed: Patient at procedure or test/ unavailable. Pt off the unit for procedure in OR. OT will follow acutely and check back as able.   Kanasia Gayman L. Safina Huard, OTR/L  03/06/24, 1:04 PM

## 2024-03-06 NOTE — Transfer of Care (Signed)
 Immediate Anesthesia Transfer of Care Note  Patient: Todd [name]  Procedure(s) Performed: INSERTION, ENDOVASCULAR STENT GRAFT, AORTA, ABDOMINAL;LEFT RENAL ARTERY STENT AMPUTATION, LEFT GREAT TOE (Left: Foot) ULTRASOUND GUIDANCE, FOR VASCULAR ACCESS (Bilateral: Groin) CUTDOWN, VASCULAR (Left: Groin) ANGIOPLASTY, USING 1cm x 14cm XENOSURE PATCH GRAFT (Left: Groin) ENDARTERECTOMY, LEFT FEMORAL, LEFT SUPERFICIAL FEMORAL ARTERY (Left: Groin)  Patient Location: PACU  Anesthesia Type:General  Level of Consciousness: awake, alert , and oriented  Airway & Oxygen Therapy: Patient Spontanous Breathing  Post-op Assessment: Report given to RN, Post -op Vital signs reviewed and stable, and Patient moving all extremities  Post vital signs: Reviewed and stable  Last Vitals:  Vitals Value Taken Time  BP 126/76 03/06/24 17:00  Temp    Pulse 95 03/06/24 17:11  Resp 9 03/06/24 17:11  SpO2 100 % 03/06/24 17:11  Vitals shown include unfiled device data.  Last Pain:  Vitals:   03/06/24 1106  TempSrc:   PainSc: 10-Worst pain ever      Patients Stated Pain Goal: 2 (03/06/24 1106)  Complications: No notable events documented.

## 2024-03-06 NOTE — Op Note (Signed)
 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 Sheree, MD Assistant: Lucie Apt, PA Procedure Performed: 1.  Percutaneous ultrasound-guided access and Pro-glide closure bilateral common femoral arteries 2.  Endovascular aortic aneurysm repair with main body left Gore conformable 28 x 14 x 12 extended with 10 x 7 cm limb via a 16 French sheath and contralateral proximal 11 x 59 VBX extended with 8 x 79 mm VBX via 8 French sheath 3.  Left renal artery stenting with 6 x 15 mm VBX 4.  Left common femoral endarterectomy with bovine pericardial patch angioplasty 5.  Left lower extremity angiogram 6.  Stent of left SFA with distal 6 x 150 mm Eluvia extended with 6 x 120 mm Eluvia and 7 x 60 mm Eluvia postdilated with 5 mm balloon 7.  Left great toe amputation   Indications: 58 year old male with history of recent symptomatic carotid stenosis status post left transcarotid artery stenting and has subsequently undergone bilateral common iliac artery stenting and left SFA stenting for chronic left lower extremity limb threatening ischemia.  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  We have discussed possible need for cutdown repair of the bilateral common femoral arteries given the severe disease bilaterally.  Findings: The right common femoral artery remains very diminutive and due to the access yesterday we elected to place only an 8 French sheath there rather than a 12 French sheath and used VBX's to extend the contralateral limb.  On the left side the common femoral artery was very  diseased.  We were able to access this and placed a 16 French sheath.  There was 75% stenosis of the left renal artery and we placed a VBX just to the level of the aorta not extended into the aortic lumen and a completion there was 0% residual stenosis.  The aortic graft was then placed just below the left renal artery and there was adequate seal there.  We extended on the right with VBX's on the left we extended down into the common iliac artery VBX that was placed yesterday using a 10 x 7 cm limb.  I completion there were no endoleak's appreciable with brisk flow into the left renal artery stent.  On the right side we were able to percutaneously access and close this and there was a strong dorsalis pedis signal.  Unfortunately on the left side the left common femoral artery had sluggish flow we elected for cutdown.  Left common femoral artery extending up into the external iliac artery were heavily diseased and down to the profunda and proximal SFA.  We were able to perform extensive endarterectomy and placed a wire down the endarterectomy site into the SFA antegrade.  We then performed patch angioplasty then through the patch perform left lower extremity angiography and stented back up to the patch with Eluvia stents.  At completion there was a very strong posterior tibial artery signal.  The toe amputation site had brisk pulsatile bleeding within the wound bed and this was closed primarily.   Procedure:  The patient was identified in the holding area and taken to the operating where is placed supine operative table and general  anesthesia was induced.  He was sterilely prepped and draped in the abdomen the bilateral groins and the left lower extremity fully in the usual fashion, antibiotics were administered timeout was called.  We began using ultrasound guidance to cannulate the right common femoral artery which was very diseased.  This was done with micropuncture needle followed by wire and sheath.  We then  placed the Bentson wire dilated with an 8 [place] dilator.  I elected just to place 1 Pro-glide device in the anterior position and then placed an 8 French sheath.  On the left side we similarly used ultrasound and identified a very diseased left common femoral artery.  Again we placed micropuncture needle followed by wire and sheath and a Bentson wire under fluoroscopic guidance as there was quite a bit of disease the past the wire and then we placed an 8 French dilator and deployed Pro-glide devices at 10:00 and 2:00.  We then placed an 8 French sheath.  We placed a Bentson wire up into the aorta and exchanged for an Amplatz wire.  We then placed a long 16 French sheath up to the level of L2 and the patient was fully heparinized.  Through the 16 French sheath we then used a 6.5 French steerable sheath and cannulated the left renal artery and confirmed luminal access and placed a Rosen wire.  We then primarily stented with a 6 x 15 mm VBX just to the level of the aortic lumen.  We then brought the main body of the device through the 16 French sheath and deployed this with the limbs crossing just at the level of the left renal artery stent.  We then recaptured this and move it up a few millimeters to be just below the left renal artery stent and this was with a cranial projection at 15 degrees.  We then cannulated the gate from the contralateral limb through the 8 French sheath we were able to spent a Omni catheter and then move this up to the level of the renal artery stent and performed angiography which demonstrated patency of the renal artery stent and at that time without even ballooning we appeared to have seal of the aortic stent.  We then extended down on the contralateral right side first with an 8 x 79 mm VBX into the existing 7 mm VBX and extended up from there with 11 x 59 mm VBX postdilated with 14 mm balloon in the gate.  The main body was then fully deployed on the left side.  We then extended down  with a 10 x 7 cm limb into the existing VBX.  We then ironed out the devices with MLB balloon on the left and on the right side there was no ironing as we had ballooned already with a 14 mm balloon at the gate and the VBX's were dilated to their nominal pressure.  We then performed angiography which demonstrated a seal without any endoleak's.  On the right side we then placed a Bentson wire remove the 8 French sheath and deployed our closure devices and placed a micropuncture sheath performed angiography and demonstrated patency of the right common femoral artery.  Satisfied with this the wire was removed and the Pro-glide was closed.  On the left side we similarly placed the Bentson and then deployed the Pro-glide devices placed a micropuncture sheath perform retrograde angiography and this unfortunately demonstrated sluggish flow to the common femoral artery with no flow distally.  With this we placed  a 5 French sheath over a Bentson wire and then plan for cutdown in the left groin.  We made a vertical incision cutdown onto the sheath.  Open-angle ligament we divided the crossing vein and gained control with vessel loop around the external artery.  The sheath at this time was removed and the external neck artery was clamped.  We dissected further down identified the profunda and the SFA for several centimeters and these were isolated as well.  The patient was heparinized further at this time.  When all the vessels were clamped we then opened the common femoral artery longitudinally up to the area of the arteriotomy.  This artery was very diseased.  We performed extensive endarterectomy up under the inguinal ligament to include the external neck artery down to the profunda and the proximal SFA.  Where there was a lumen in the SFA I placed a Bentson wire and then transected the lesion as I knew this would require stenting.  We then performed extensive patch angioplasty with bovine pericardium.  Prior to completing  this I did feel a wire retrograde through the bovine pericardium using a micropuncture sheath and the Bentson wire was placed through the patch.  Prior to completion of the patch angioplasty I irrigated the lumen with heparinized saline.  We then released the clamps.  I then placed a 6 French sheath antegrade into the SFA performed left lower extremity angiography.  We then began stenting distally after placing a Rosen wire down through the existing stents in the SFA.  We stented distally with a 6 x 150 mm VBX and extend approximately 6 x 120 and 7 x 60 and these were all ballooned with 5 mm balloon at completion.  After this there was brisk flow distally we could only see down to the level of the below-knee.  There was a strong posterior tibial signal.  I removed the wire and the sheath and suture-ligated the cannulation site.  After the strong signal the PT was identified we administered 50 mg of protamine  which he did tolerate well.  We obtained meticulous hemostasis in the groin and then closed in layers of Vicryl Monocryl and Dermabond was placed at the skin level.  We then turned our attention to the left foot.  A tennis racquet type incision was made around the left first toe.  We dissected down to the bone identified the  metatarsal phalangeal joint space and remove the toe entirely.  We removed the joint space capsule on the metatarsal with a rongeur and then smoothed this with rasp.  Obtain hemostasis with cautery and then closed the overlying tissue with interrupted 3-0 nylon sutures.  Patient was then awakened from anesthesia having tolerated the procedure without immediate complication.  All counts were correct at completion.  Transfusion: 4 units packed red blood cells  EBL: 1200cc  Contrast: 125cc   Tanetta Fuhriman C. Sheree, MD [facility] Office: [phone] Pager: [phone]
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
## 2024-03-06 NOTE — Progress Notes (Signed)
Patient back from procedure.

## 2024-03-06 NOTE — Progress Notes (Signed)
 PT Cancellation Note  Patient Details Name: Todd [name] MRN: [id number] DOB: [birth year]   Cancelled Treatment:    Reason Eval/Treat Not Completed: (P) Patient at procedure or test/unavailable (pt off unit -plan for OR procedure) Will continue efforts per PT plan of care as schedule permits.  [name] [name] 03/06/2024, 10:57 AM

## 2024-03-06 NOTE — Anesthesia Procedure Notes (Addendum)
 Procedure Name: Intubation Date/Time: 03/06/2024 12:19 PM  Performed by: Jerl Donald LABOR, CRNAPre-anesthesia Checklist: Patient identified, Emergency Drugs available, Suction available and Patient being monitored Patient Re-evaluated:Patient Re-evaluated prior to induction Oxygen Delivery Method: Circle System Utilized Preoxygenation: Pre-oxygenation with 100% oxygen Induction Type: IV induction Ventilation: Mask ventilation without difficulty and Oral airway inserted - appropriate to patient size Laryngoscope Size: Mac and 4 Grade View: Grade II Tube type: Oral Tube size: 7.5 mm Number of attempts: 1 Airway Equipment and Method: Stylet and Oral airway Placement Confirmation: ETT inserted through vocal cords under direct vision, positive ETCO2 and breath sounds checked- equal and bilateral Secured at: 23 cm Tube secured with: Tape Dental Injury: Teeth and Oropharynx as per pre-operative assessment

## 2024-03-06 NOTE — Anesthesia Preprocedure Evaluation (Addendum)
 Anesthesia Evaluation  Patient identified by MRN, date of birth, ID band Patient awake    Reviewed: Allergy & Precautions, H&P , NPO status , Patient's Chart, lab work & pertinent test results  History of Anesthesia Complications Negative for: history of anesthetic complications  Airway Mallampati: III  TM Distance: >3 FB Neck ROM: Full    Dental  (+) Poor Dentition, Dental Advisory Given, Chipped   Pulmonary neg sleep apnea, neg COPD, Current Smoker and Patient abstained from smoking., former smoker   Pulmonary exam normal breath sounds clear to auscultation       Cardiovascular Exercise Tolerance: Good METShypertension, Pt. on medications + Peripheral Vascular Disease  (-) CAD and (-) Past MI Normal cardiovascular exam(-) dysrhythmias  Rhythm:Regular Rate:Normal  ECHO 8/25 1. Left ventricular ejection fraction, by estimation, is 60 to 65%. The  left ventricle has normal function. The left ventricle has no regional  wall motion abnormalities. Left ventricular diastolic parameters are  consistent with Grade I diastolic  dysfunction (impaired relaxation).   2. Right ventricular systolic function is normal. The right ventricular  size is normal. There is normal pulmonary artery systolic pressure.   3. The mitral valve is normal in structure. No evidence of mitral valve  regurgitation. No evidence of mitral stenosis.   4. The aortic valve was not well visualized. There is mild calcification  of the aortic valve. There is mild thickening of the aortic valve. Aortic  valve regurgitation is not visualized. Aortic valve sclerosis is present,  with no evidence of aortic valve   stenosis.   5. The inferior vena cava is normal in size with greater than 50%  respiratory variability, suggesting right atrial pressure of 3 mmHg.     Neuro/Psych Stroke last week s/p TCAR. Residual slurred speech. CVA, Residual Symptoms negative  neurological ROS  negative psych ROS   GI/Hepatic negative GI ROS, Neg liver ROS,neg GERD  ,,  Endo/Other  negative endocrine ROSneg diabetes    Renal/GU negative Renal ROS  negative genitourinary   Musculoskeletal negative musculoskeletal ROS (+)    Abdominal   Peds negative pediatric ROS (+)  Hematology negative hematology ROS (+)   Anesthesia Other Findings Past Medical History: No date: Hyperlipidemia No date: Hypertension  Reproductive/Obstetrics negative OB ROS                              Anesthesia Physical Anesthesia Plan  ASA: 4  Anesthesia Plan: General   Post-op Pain Management: Ofirmev  IV (intra-op)*   Induction: Intravenous  PONV Risk Score and Plan: 2 and Ondansetron , Dexamethasone , Treatment may vary due to age or medical condition and Midazolam   Airway Management Planned: Oral ETT  Additional Equipment: Arterial line and ClearSight  Intra-op Plan:   Post-operative Plan: Extubation in OR and Possible Post-op intubation/ventilation  Informed Consent: I have reviewed the patients History and Physical, chart, labs and discussed the procedure including the risks, benefits and alternatives for the proposed anesthesia with the patient or authorized representative who has indicated his/her understanding and acceptance.     Dental advisory given  Plan Discussed with: Anesthesiologist and CRNA  Anesthesia Plan Comments: (Discussed risks of anesthesia with patient, including PONV, sore throat, lip/dental/eye damage. Rare risks discussed as well, such as cardiorespiratory and neurological sequelae, and allergic reactions. Discussed the role of CRNA in patient's perioperative care. Patient understands. Patient counseled on being higher risk for anesthesia due to comorbidities: recent stroke, high risk surgery.  Patient was told about increased risk of cardiac and respiratory events, including death.  Patient had arterial line  placed in left radial artery recently for TCAR procedure and the wire ended up shearing off in the artery, ended up being retrieved. Patient is understandably very hesitant about additional arterial line placements. I spoke with the surgeon who says he is able to provide a side port in femoral artery for us  to transduce. Additonally will utilize clearsight. Backup option is radial or brachial arterial line placement after induction of anesthesia. Patient is thankful and understanding. )         Anesthesia Quick Evaluation

## 2024-03-06 NOTE — Progress Notes (Addendum)
 Progress Note    03/06/2024 8:37 AM 1 Day Post-Op  Subjective:  says he slept through the night last night for first time in months. Pain is much improved   Vitals:   03/05/24 2300 03/06/24 0300  BP: 96/63 94/63  Pulse: 81 78  Resp: 19 18  Temp: 97.7 F (36.5 C) (!) 97.4 F (36.3 C)  SpO2: 95% 97%   Physical Exam: Cardiac:  regular Lungs: non labored Incision: left neck incision is c/d/I without swelling or hematoma; right CF access site with small hematoma. Ecchymosis present. Soft Extremities: Doppler PT/ Pero doppler signals of LLE Abdomen:  soft Neurologic: alert and oriented  CBC    Component Value Date/Time   WBC 8.0 03/06/2024 0341   RBC 3.04 (L) 03/06/2024 0341   HGB 9.8 (L) 03/06/2024 0341   HGB 13.7 10/06/2023 1530   HCT 29.0 (L) 03/06/2024 0341   HCT 40.7 10/06/2023 1530   PLT 226 03/06/2024 0341   PLT 231 10/06/2023 1530   MCV 95.4 03/06/2024 0341   MCV 97 10/06/2023 1530   MCH 32.2 03/06/2024 0341   MCHC 33.8 03/06/2024 0341   RDW 12.5 03/06/2024 0341   RDW 12.5 10/06/2023 1530   LYMPHSABS 1.8 02/27/2024 1426   LYMPHSABS 2.6 10/06/2023 1530   MONOABS 0.9 02/27/2024 1426   EOSABS 0.0 02/27/2024 1426   EOSABS 0.3 10/06/2023 1530   BASOSABS 0.1 02/27/2024 1426   BASOSABS 0.1 10/06/2023 1530    BMET    Component Value Date/Time   NA 133 (L) 03/06/2024 0341   NA 136 10/06/2023 1530   K 4.1 03/06/2024 0341   CL 104 03/06/2024 0341   CO2 21 (L) 03/06/2024 0341   GLUCOSE 96 03/06/2024 0341   BUN 14 03/06/2024 0341   BUN 16 10/06/2023 1530   CREATININE 0.82 03/06/2024 0341   CALCIUM  8.7 (L) 03/06/2024 0341   GFRNONAA >60 03/06/2024 0341   GFRAA 112 02/16/2018 0939    INR    Component Value Date/Time   INR 1.4 (H) 02/27/2024 1532     Intake/Output Summary (Last 24 hours) at 03/06/2024 0837 Last data filed at 03/06/2024 0300 Gross per 24 hour  Intake --  Output 550 ml  Net -550 ml     Assessment/Plan:  58 y.o. male is s/p  Aortogram,arteriogram with shockwave lithotripsy of left SFA, left SFA Stent and stenting of left and right CIA 1 Day Post-Op   Right CF access site with small hematoma, soft LLE well perfused and warm with doppler PT/Pero signals Left great toe gangrene Remains neurologically intact from TCAR 8/8. Incision healing well Keep NPO Consent ordered Scheduled for EVAR today and left great toe amputation in OR by Dr. Sheree Teretha Damme, PA-C [facility] [phone] 03/06/2024 8:37 AM  I have independently interviewed and examined patient and agree with PA assessment and plan above.  He does have a weakly palpable left common femoral pulse, there is small hematoma on the right from access yesterday.  Plan is for EVAR to seal aneurysm and achieve better inflow.  Limbs should be able to land into 7 mm stents placed yesterday which can be postdilated to 11 mm.  Anatomy would likely require left renal artery stenting as well particularly in case patient needs future perivisceral repair.  We will also plan for amputation of left first toe today.  All of this was discussed with him and he demonstrates good understanding.  Katianne Barre C. Sheree, MD [facility] [place] Office: [phone] Pager: [phone]

## 2024-03-07 ENCOUNTER — Encounter (HOSPITAL_COMMUNITY): Payer: Self-pay | Admitting: Vascular Surgery

## 2024-03-07 DIAGNOSIS — Z95828 Presence of other vascular implants and grafts: Secondary | ICD-10-CM

## 2024-03-07 DIAGNOSIS — Z95820 Peripheral vascular angioplasty status with implants and grafts: Secondary | ICD-10-CM

## 2024-03-07 DIAGNOSIS — Z89412 Acquired absence of left great toe: Secondary | ICD-10-CM

## 2024-03-07 LAB — RENAL FUNCTION PANEL
Albumin: 3.3 g/dL — ABNORMAL LOW (ref 3.5–5.0)
Anion gap: 14 (ref 5–15)
BUN: 15 mg/dL (ref 6–20)
CO2: 18 mmol/L — ABNORMAL LOW (ref 22–32)
Calcium: 8.5 mg/dL — ABNORMAL LOW (ref 8.9–10.3)
Chloride: 101 mmol/L (ref 98–111)
Creatinine, Ser: 1.05 mg/dL (ref 0.61–1.24)
GFR, Estimated: >60 mL/min (ref >60)
Glucose, Bld: 147 mg/dL — ABNORMAL HIGH (ref 70–99)
Phosphorus: 3 mg/dL (ref 2.5–4.6)
Potassium: 4.2 mmol/L (ref 3.5–5.1)
Sodium: 133 mmol/L — ABNORMAL LOW (ref 135–145)

## 2024-03-07 LAB — CBC
HCT: 31 % — ABNORMAL LOW (ref 39.0–52.0)
Hemoglobin: 10.6 g/dL — ABNORMAL LOW (ref 13.0–17.0)
MCH: 29.1 pg (ref 26.0–34.0)
MCHC: 34.2 g/dL (ref 30.0–36.0)
MCV: 85.2 fL (ref 80.0–100.0)
Platelets: 155 K/uL (ref 150–400)
RBC: 3.64 MIL/uL — ABNORMAL LOW (ref 4.22–5.81)
RDW: 18 % — ABNORMAL HIGH (ref 11.5–15.5)
WBC: 14.2 K/uL — ABNORMAL HIGH (ref 4.0–10.5)
nRBC: 0 % (ref 0.0–0.2)

## 2024-03-07 LAB — BASIC METABOLIC PANEL WITH GFR
Anion gap: 11 (ref 5–15)
BUN: 13 mg/dL (ref 6–20)
CO2: 18 mmol/L — ABNORMAL LOW (ref 22–32)
Calcium: 8.3 mg/dL — ABNORMAL LOW (ref 8.9–10.3)
Chloride: 104 mmol/L (ref 98–111)
Creatinine, Ser: 0.97 mg/dL (ref 0.61–1.24)
GFR, Estimated: >60 mL/min (ref >60)
Glucose, Bld: 148 mg/dL — ABNORMAL HIGH (ref 70–99)
Potassium: 4.2 mmol/L (ref 3.5–5.1)
Sodium: 133 mmol/L — ABNORMAL LOW (ref 135–145)

## 2024-03-07 LAB — PREPARE RBC (CROSSMATCH)

## 2024-03-07 LAB — MAGNESIUM: Magnesium: 1.7 mg/dL (ref 1.7–2.4)

## 2024-03-07 MED ORDER — OXYCODONE HCL 5 MG PO TABS
10.0000 mg | ORAL_TABLET | ORAL | Status: DC | PRN
  Administered 2024-03-07 (×2): 10 mg via ORAL
  Filled 2024-03-07: qty 2

## 2024-03-07 MED ORDER — POLYETHYLENE GLYCOL 3350 17 G PO PACK
17.0000 g | PACK | Freq: Two times a day (BID) | ORAL | Status: AC
  Administered 2024-03-07 – 2024-03-08 (×6): 17 g via ORAL
  Filled 2024-03-07 (×4): qty 1

## 2024-03-07 NOTE — TOC Progression Note (Signed)
 Transition of Care (TOC) - Progression Note  Rayfield Gobble RN, BSN Inpatient Care Management Unit 4E- RN Case Manager See Treatment Team for direct phone #   Patient Details  Name: Todd [name] MRN: [id number] Date of Birth: [birth year]  Transition of Care [facility]) CM/SW Contact  Gobble, Rayfield Hurst, RN Phone Number: 03/07/2024, 4:20 PM  Clinical Narrative:    Initially admitted w/ CVA and s/p TCAR- now Pt s/p left renal artery stenting, EVAR, left common femoral endarterectomy, left lower extremity angiography with stenting of the SFA and popliteal arteries and left first toe amputation.   Noted PT has updated recommendations  to [facility] for discharge s/p most recent surgery.   CM in to speak with pt to discuss HH vs outpt therapy. Per previous CM note- outpt referral has been made to [facility]. List provided to pt for [facility] choice- Per CMS guidelines from [email or web address] website with star ratings (copy placed in shadow chart)- Pt voiced he would like to discuss with his SO, but thinking he may need to start with [facility] and transition to outpt once his foot has healed some. CM to follow up in am.   Discussed DME needs- pt voiced he will need RW for home- will need DME order, Pt stated that he has a friend that he can borrow a BSC and shower chair from.   SO will plan to transport home   CM will follow for HH vs outpt therapy needs and arrange DME-RW once MD has placed order.    Expected Discharge Plan: OP Rehab Barriers to Discharge: Continued Medical Work up               Expected Discharge Plan and Services   Discharge Planning Services: CM Consult Post Acute Care Choice: Home Health, Durable Medical Equipment Living arrangements for the past 2 months: Single Family Home                 DME Arranged: Walker rolling         HH Arranged: PT, OT           Social Drivers of Health (SDOH) Interventions SDOH Screenings   Food Insecurity: No Food Insecurity  (02/29/2024)  Housing: Low Risk  (02/29/2024)  Transportation Needs: No Transportation Needs (02/29/2024)  Utilities: Not At Risk (02/29/2024)  Alcohol Screen: Low Risk  (10/05/2022)  Depression (PHQ2-9): Low Risk  (10/06/2023)  Financial Resource Strain: Low Risk  (10/05/2022)  Physical Activity: Sufficiently Active (10/05/2022)  Social Connections: Socially Isolated (10/05/2022)  Stress: No Stress Concern Present (04/07/2023)  Tobacco Use: High Risk (03/06/2024)  Health Literacy: Adequate Health Literacy (04/07/2023)    Readmission Risk Interventions     No data to display

## 2024-03-07 NOTE — Progress Notes (Signed)
  Progress Note    03/07/2024 8:20 AM 1 Day Post-Op  Subjective: Having left groin pain, left toe amputation site pain improved  Vitals:   03/06/24 2300 03/07/24 0300  BP: 95/74 115/75  Pulse: 96 89  Resp: 15 20  Temp: 97.7 F (36.5 C) 98.4 F (36.9 C)  SpO2: 100% 96%    Physical Exam: Awake alert and oriented Speech somewhat improved Left neck incision healing well Bilateral groins have hematoma left groin incision clean dry intact Left lower extremity with expected edema, left foot dressing is in place Strong left posterior tibial signal  CBC    Component Value Date/Time   WBC 14.2 (H) 03/07/2024 0312   RBC 3.64 (L) 03/07/2024 0312   HGB 10.6 (L) 03/07/2024 0312   HGB 13.7 10/06/2023 1530   HCT 31.0 (L) 03/07/2024 0312   HCT 40.7 10/06/2023 1530   PLT 155 03/07/2024 0312   PLT 231 10/06/2023 1530   MCV 85.2 03/07/2024 0312   MCV 97 10/06/2023 1530   MCH 29.1 03/07/2024 0312   MCHC 34.2 03/07/2024 0312   RDW 18.0 (H) 03/07/2024 0312   RDW 12.5 10/06/2023 1530   LYMPHSABS 1.8 02/27/2024 1426   LYMPHSABS 2.6 10/06/2023 1530   MONOABS 0.9 02/27/2024 1426   EOSABS 0.0 02/27/2024 1426   EOSABS 0.3 10/06/2023 1530   BASOSABS 0.1 02/27/2024 1426   BASOSABS 0.1 10/06/2023 1530    BMET    Component Value Date/Time   NA 133 (L) 03/07/2024 0314   NA 136 10/06/2023 1530   K 4.2 03/07/2024 0314   CL 101 03/07/2024 0314   CO2 18 (L) 03/07/2024 0314   GLUCOSE 147 (H) 03/07/2024 0314   BUN 15 03/07/2024 0314   BUN 16 10/06/2023 1530   CREATININE 1.05 03/07/2024 0314   CALCIUM  8.5 (L) 03/07/2024 0314   GFRNONAA >60 03/07/2024 0314   GFRAA 112 02/16/2018 0939    INR    Component Value Date/Time   INR 1.6 (H) 03/06/2024 1924     Intake/Output Summary (Last 24 hours) at 03/07/2024 0820 Last data filed at 03/07/2024 0400 Gross per 24 hour  Intake 5258.13 ml  Output 2000 ml  Net 3258.13 ml     Assessment/plan:  58 y.o. 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  He also has chronic left lower extremity limb threatening ischemia with concomitant aortic aneurysm this has been fixed with left renal artery stenting, EVAR, left common femoral endarterectomy, left lower extremity angiography with stenting of the SFA and popliteal arteries and left first toe amputation.  He is healing well from all of this.  He will need to remain on aspirin  and Plavix  for stenting of the left ICA as well as left SFA.  He is okay for activity as tolerated with the postoperative shoe to protect his amputation site.  We will remove the dressing tomorrow.   Todd Sandin C. Sheree, MD [facility] [place] Office: [phone] Pager: [phone]  03/07/2024 8:20 AM

## 2024-03-07 NOTE — Progress Notes (Signed)
 Physical Therapy Treatment Patient Details Name: Todd [name] MRN: [id number] DOB: 02-23-1966 Today's Date: 03/07/2024   History of Present Illness Pt is a 58 y/o male presenting on 8/4 with R sided weakness and slurred speech. 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  PMH includes: HTN, R shoulder surgery.    PT Comments  Pt goals re-assessed and remain appropriate. Currently pt is CGA for sit to stand and gait with RW. Pt did well maintaining heel WB in forefoot off loading post op shoe. No overt LOB. Pt girlfriend can assist with light tasks at home. Due to pt current functional status, home set up and available assistance at home recommending skilled physical therapy services 3x/week in order to address strength, balance and functional mobility to decrease risk for falls, injury and re-hospitalization.       If plan is discharge home, recommend the following: Assistance with cooking/housework;Assist for transportation;Help with stairs or ramp for entrance     Equipment Recommendations  Rolling walker (2 wheels)       Precautions / Restrictions Precautions Precautions: Fall Recall of Precautions/Restrictions: Impaired Required Braces or Orthoses: Other Brace Other Brace: forefoot off loading Restrictions Weight Bearing Restrictions Per Provider Order: No     Mobility  Bed Mobility     General bed mobility comments: Pt in recliner at beginning and end of session    Transfers Overall transfer level: Needs assistance Equipment used: Rolling walker (2 wheels) Transfers: Sit to/from Stand Sit to Stand: Contact guard assist           General transfer comment: for safety    Ambulation/Gait Ambulation/Gait assistance: Contact guard assist Gait Distance (Feet): 30  Feet Assistive device: Rolling walker (2 wheels) Gait Pattern/deviations: Step-to pattern, Decreased stance time - left, Antalgic Gait velocity: decreased Gait velocity interpretation: <1.31 ft/sec, indicative of household ambulator   General Gait Details: CGA for safety, no overt LOB. pt did well in post op forefoot off loading shoe   Stairs         General stair comments: went over sequencing but pt limited by pain today   Modified Rankin (Stroke Patients Only) Modified Rankin (Stroke Patients Only) Pre-Morbid Rankin Score: No significant disability Modified Rankin: Moderate disability     Balance Overall balance assessment: Needs assistance Sitting-balance support: No upper extremity supported, Feet supported Sitting balance-Leahy Scale: Good     Standing balance support: Bilateral upper extremity supported Standing balance-Leahy Scale: Fair Standing balance comment: no overt LOB        Communication Communication Communication: Impaired Factors Affecting Communication: Reduced clarity of speech  Cognition Arousal: Alert Behavior During Therapy: WFL for tasks assessed/performed   PT - Cognitive impairments: Memory, Attention, Safety/Judgement     PT - Cognition Comments: perseverating on grion pain. Following commands: Impaired Following commands impaired: Follows multi-step commands inconsistently    Cueing Cueing Techniques: Verbal cues         Pertinent Vitals/Pain Pain Assessment Pain Assessment: 0-10 Pain Score: 7  Pain Location: groin Pain Descriptors / Indicators: Stabbing, Sharp Pain Intervention(s): Limited activity within patient's tolerance, Monitored during session     PT Goals (current goals can now be found in the care plan section) Acute Rehab PT Goals Patient Stated Goal: return to work, less pain PT Goal Formulation: With  patient Time For Goal Achievement: 03/21/24 Potential to Achieve Goals: Good Progress towards PT goals: Goals  updated    Frequency    Min 2X/week      PT Plan  Updated discharge recommendations       AM-PAC PT 6 Clicks Mobility   Outcome Measure  Help needed turning from your back to your side while in a flat bed without using bedrails?: A Little Help needed moving from lying on your back to sitting on the side of a flat bed without using bedrails?: A Little Help needed moving to and from a bed to a chair (including a wheelchair)?: A Little Help needed standing up from a chair using your arms (e.g., wheelchair or bedside chair)?: A Little Help needed to walk in hospital room?: A Little Help needed climbing 3-5 steps with a railing? : A Little 6 Click Score: 18    End of Session Equipment Utilized During Treatment: Gait belt;Other (comment) (forefoot off loading post op shoe) Activity Tolerance: Patient tolerated treatment well;Patient limited by pain Patient left: in chair;with call bell/phone within reach Nurse Communication: Mobility status PT Visit Diagnosis: Other abnormalities of gait and mobility (R26.89);Unsteadiness on feet (R26.81);Pain Pain - Right/Left: Left Pain - part of body: Hip     Time: 8883-8860 PT Time Calculation (min) (ACUTE ONLY): 23 min  Charges:    $Therapeutic Activity: 8-22 mins PT General Charges $$ ACUTE PT VISIT: 1 Visit                     Dorothyann Maier, DPT, CLT  Acute Rehabilitation Services Office: [phone] (Secure chat preferred)    Dorothyann VEAR Maier 03/07/2024, 11:56 AM

## 2024-03-07 NOTE — Progress Notes (Signed)
 Orthopedic Tech Progress Note Patient Details:  Todd [name] [facility] 09/06/1965 [id number]  Ortho Devices Type of Ortho Device: Darco shoe Ortho Device/Splint Location: LLE Ortho Device/Splint Interventions: Ordered, Application, Adjustment   Post Interventions Patient Tolerated: Well Instructions Provided: Care of device  Todd [name] Pac 03/07/2024, 10:57 AM

## 2024-03-07 NOTE — Progress Notes (Signed)
 Mobility Specialist Progress Note:   03/07/24 1430  Mobility  Activity Ambulated with assistance  Level of Assistance Minimal assist, patient does 75% or more  Assistive Device Front wheel walker  Distance Ambulated (ft) 30 ft  Activity Response Tolerated well  Mobility Referral Yes  Mobility visit 1 Mobility  Mobility Specialist Start Time (ACUTE ONLY) 1430  Mobility Specialist Stop Time (ACUTE ONLY) 1440  Mobility Specialist Time Calculation (min) (ACUTE ONLY) 10 min   Pt received in chair, agreeable to mobility session. Ambulated in room with RW, MinA to stand. Tolerated well, Max HR 117 bpm. Returned pt to room, sitting up in chair, eating lunch.    Todd [name] Mobility Specialist Please contact via Special educational needs teacher or  Rehab office at [phone]
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
## 2024-03-07 NOTE — Anesthesia Postprocedure Evaluation (Signed)
 Anesthesia Post Note  Patient: Todd [name]  Procedure(s) Performed: INSERTION, ENDOVASCULAR STENT GRAFT, AORTA, ABDOMINAL;LEFT RENAL ARTERY STENT AMPUTATION, LEFT GREAT TOE (Left: Foot) ULTRASOUND GUIDANCE, FOR VASCULAR ACCESS (Bilateral: Groin) CUTDOWN, VASCULAR (Left: Groin) ANGIOPLASTY, USING 1cm x 14cm XENOSURE PATCH GRAFT (Left: Groin) ENDARTERECTOMY, LEFT FEMORAL, LEFT SUPERFICIAL FEMORAL ARTERY (Left: Groin)     Patient location during evaluation: PACU Anesthesia Type: General Level of consciousness: awake and alert Pain management: pain level controlled Vital Signs Assessment: post-procedure vital signs reviewed and stable Respiratory status: spontaneous breathing, nonlabored ventilation and respiratory function stable Cardiovascular status: stable and blood pressure returned to baseline Anesthetic complications: no   No notable events documented.  Last Vitals:  Vitals:   03/06/24 2300 03/07/24 0300  BP: 95/74 115/75  Pulse: 96 89  Resp: 15 20  Temp: 36.5 C 36.9 C  SpO2: 100% 96%    Last Pain:  Vitals:   03/07/24 0736  TempSrc:   PainSc: 0-No pain                 Todd [name] Like
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
## 2024-03-08 DIAGNOSIS — Z95828 Presence of other vascular implants and grafts: Secondary | ICD-10-CM

## 2024-03-08 DIAGNOSIS — R911 Solitary pulmonary nodule: Secondary | ICD-10-CM | POA: Diagnosis not present

## 2024-03-08 DIAGNOSIS — I6523 Occlusion and stenosis of bilateral carotid arteries: Secondary | ICD-10-CM

## 2024-03-08 DIAGNOSIS — I739 Peripheral vascular disease, unspecified: Secondary | ICD-10-CM | POA: Diagnosis not present

## 2024-03-08 DIAGNOSIS — Z89412 Acquired absence of left great toe: Secondary | ICD-10-CM

## 2024-03-08 DIAGNOSIS — Z95820 Peripheral vascular angioplasty status with implants and grafts: Secondary | ICD-10-CM

## 2024-03-08 DIAGNOSIS — I7143 Infrarenal abdominal aortic aneurysm, without rupture: Secondary | ICD-10-CM | POA: Diagnosis not present

## 2024-03-08 LAB — BASIC METABOLIC PANEL WITH GFR
Anion gap: 11 (ref 5–15)
BUN: 24 mg/dL — ABNORMAL HIGH (ref 6–20)
CO2: 19 mmol/L — ABNORMAL LOW (ref 22–32)
Calcium: 8.1 mg/dL — ABNORMAL LOW (ref 8.9–10.3)
Chloride: 101 mmol/L (ref 98–111)
Creatinine, Ser: 1.13 mg/dL (ref 0.61–1.24)
GFR, Estimated: >60 mL/min (ref >60)
Glucose, Bld: 110 mg/dL — ABNORMAL HIGH (ref 70–99)
Potassium: 3.8 mmol/L (ref 3.5–5.1)
Sodium: 131 mmol/L — ABNORMAL LOW (ref 135–145)

## 2024-03-08 MED ORDER — HYDROMORPHONE HCL 1 MG/ML IJ SOLN
0.5000 mg | INTRAMUSCULAR | Status: DC | PRN
  Administered 2024-03-08 – 2024-03-09 (×5): 0.5 mg via INTRAVENOUS
  Filled 2024-03-08 (×5): qty 0.5

## 2024-03-08 MED ORDER — OXYCODONE HCL 5 MG PO TABS: 10.0000 mg | ORAL_TABLET | Freq: Four times a day (QID) | ORAL | Status: DC | PRN
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
## 2024-03-08 NOTE — Progress Notes (Signed)
  Progress Note    03/08/2024 7:45 AM 2 Days Post-Op  Subjective: He is okay this morning, had some swelling in his left lower extremity overnight  Vitals:   03/08/24 0207 03/08/24 0300  BP: 99/64 (!) 93/58  Pulse:    Resp: (!) 21 17  Temp:  98 F (36.7 C)  SpO2:      Physical Exam: Awake alert and oriented Nonlabored respirations Speech is improving Left neck incision healing well Left groin incision healing well with expected postoperative hematoma There is hematoma in the right groin but this is soft Left lower extremity has swelling that is expected postoperatively There is a very strong posterior tibial signal at the ankle, I cannot palpate a pulse this may be secondary to swelling The left great toe amputation site is healing well  CBC    Component Value Date/Time   WBC 14.2 (H) 03/07/2024 0312   RBC 3.64 (L) 03/07/2024 0312   HGB 10.6 (L) 03/07/2024 0312   HGB 13.7 10/06/2023 1530   HCT 31.0 (L) 03/07/2024 0312   HCT 40.7 10/06/2023 1530   PLT 155 03/07/2024 0312   PLT 231 10/06/2023 1530   MCV 85.2 03/07/2024 0312   MCV 97 10/06/2023 1530   MCH 29.1 03/07/2024 0312   MCHC 34.2 03/07/2024 0312   RDW 18.0 (H) 03/07/2024 0312   RDW 12.5 10/06/2023 1530   LYMPHSABS 1.8 02/27/2024 1426   LYMPHSABS 2.6 10/06/2023 1530   MONOABS 0.9 02/27/2024 1426   EOSABS 0.0 02/27/2024 1426   EOSABS 0.3 10/06/2023 1530   BASOSABS 0.1 02/27/2024 1426   BASOSABS 0.1 10/06/2023 1530    BMET    Component Value Date/Time   NA 131 (L) 03/08/2024 0254   NA 136 10/06/2023 1530   K 3.8 03/08/2024 0254   CL 101 03/08/2024 0254   CO2 19 (L) 03/08/2024 0254   GLUCOSE 110 (H) 03/08/2024 0254   BUN 24 (H) 03/08/2024 0254   BUN 16 10/06/2023 1530   CREATININE 1.13 03/08/2024 0254   CALCIUM  8.1 (L) 03/08/2024 0254   GFRNONAA >60 03/08/2024 0254   GFRAA 112 02/16/2018 0939    INR    Component Value Date/Time   INR 1.6 (H) 03/06/2024 1924     Intake/Output Summary  (Last 24 hours) at 03/08/2024 0745 Last data filed at 03/08/2024 0130 Gross per 24 hour  Intake 500.85 ml  Output 300 ml  Net 200.85 ml     Assessment/plan:  58 y.o. male is admitted with symptomatic left ICA stenosis status post TCAR for which he is recovering well.  He also has chronic left lower extremity limb threatening ischemia with concomitant aortic aneurysm this has been fixed with left renal artery stenting, EVAR, left common femoral endarterectomy, left lower extremity angiography with stenting of the SFA and popliteal arteries and left first toe amputation.  He continues to progress well and speech is improving.  Continue aspirin , Plavix  and statin.  I discussed the need to diligently protect the left great toe amputation site and to wear his postoperative shoe whenever out of bed.  He does need to elevate his leg when recumbent given that he does have swelling of the left lower extremity which is expected status post revascularization.   Jiyah Torpey C. Sheree, MD [facility] Office: [phone] Pager: [phone]  03/08/2024 7:45 AM

## 2024-03-08 NOTE — Progress Notes (Signed)
 Mobility Specialist Progress Note:    03/08/24 0857  Mobility  Activity Ambulated with assistance  Level of Assistance Contact guard assist, steadying assist  Assistive Device Front wheel walker  Distance Ambulated (ft) 20 ft  Activity Response Tolerated well  Mobility Referral Yes  Mobility visit 1 Mobility  Mobility Specialist Start Time (ACUTE ONLY) 0857  Mobility Specialist Stop Time (ACUTE ONLY) 0906  Mobility Specialist Time Calculation (min) (ACUTE ONLY) 9 min   Pt received in chair, agreeable to ambulate in room to sit up by sink for bath. CGA for safety with RW. Tolerated WB precautions via Darco Shoe. Left pt at sink w NT, all needs met.   Evens Meno Mobility Specialist Please contact via Special educational needs teacher or  Rehab office at [phone]

## 2024-03-08 NOTE — Progress Notes (Signed)
 Physical Therapy Treatment Patient Details Name: Todd [name] MRN: [id number] DOB: March 12, 1966 Today's Date: 03/08/2024   History of Present Illness Pt is a 58 y/o male presenting on 8/4 with R sided weakness and slurred speech. 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  PMH includes: HTN, R shoulder surgery.    PT Comments  Pt received in recliner with both knees in flexion, c/o pain after recent premedication, with good participation and tolerance for transfer, gait and stair training as able. Pt BP soft but after encouragement to drink water and reciprocal transfer training, pt BP more stable standing so able to proceed with standing activity. Pt needing up to CGA for functional mobility tasks with RW/BUE support, and mod cues for better body mechanics with heel weight bearing restriction, pt receptive to instruction. Very mild dysarthric speech but pt following 1-step commands well. Pt continues to benefit from PT services to progress toward functional mobility goals.    If plan is discharge home, recommend the following: Assistance with cooking/housework;Assist for transportation;Help with stairs or ramp for entrance   Can travel by private vehicle        Equipment Recommendations  Rolling walker (2 wheels)    Recommendations for Other Services       Precautions / Restrictions Precautions Precautions: Fall Recall of Precautions/Restrictions: Impaired Precaution/Restrictions Comments: fair recall of heel WB, needs min reminders for safe RW use Required Braces or Orthoses: Other Brace Other Brace: darco shoe Restrictions Weight Bearing Restrictions Per Provider Order: Yes LLE Weight Bearing Per Provider Order: Weight bearing as tolerated Other Position/Activity  Restrictions: in Darco Shoe     Mobility  Bed Mobility               General bed mobility comments: Pt in recliner at beginning and end of session    Transfers Overall transfer level: Needs assistance Equipment used: Rolling walker (2 wheels) Transfers: Sit to/from Stand Sit to Stand: Contact guard assist           General transfer comment: chair<>RW x5 reps including rest breaks; soft BP sitting/standing, but after reciprocal STS x4 reps BP more stable so able to proceed.    Ambulation/Gait Ambulation/Gait assistance: Contact guard assist, Min assist Gait Distance (Feet): 30 Feet Assistive device: Rolling walker (2 wheels) Gait Pattern/deviations: Step-to pattern, Decreased stance time - left, Antalgic, Decreased weight shift to left, Decreased step length - right Gait velocity: decreased     General Gait Details: CGA for safety, no overt LOB. Pt did well in post-op forefoot off loading shoe with surgical shoe on other foot, needs min cues not to lift RW up off floor when turning or taking steps, x1 epsiode of minA for RW management/safety. Fair carryover of cues.   Stairs Stairs: Yes Stairs assistance: Contact guard assist Stair Management: Two rails, Step to pattern, Forwards, Backwards Number of Stairs: 4 General stair comments: pt ascended/descended single 7.5 platform step forward in room x3 reps, then backward up/down x1 rep for teachback of alternate method, pt receptive to instruction, CGA for safety but no buckling or LOB.   Wheelchair Mobility     Tilt Bed    Modified Rankin (Stroke Patients Only) Modified Rankin (Stroke Patients Only) Pre-Morbid Rankin Score: No significant disability Modified Rankin: Moderate disability  Balance Overall balance assessment: Needs assistance Sitting-balance support: No upper extremity supported, Feet supported Sitting balance-Leahy Scale: Good     Standing balance support: Bilateral upper extremity  supported, Single extremity supported, During functional activity, Reliant on assistive device for balance Standing balance-Leahy Scale: Poor Standing balance comment: reliant on RW; poor unsupported                            Communication Communication Communication: No apparent difficulties  Cognition Arousal: Alert Behavior During Therapy: WFL for tasks assessed/performed   PT - Cognitive impairments: Memory, Attention, Safety/Judgement, Sequencing                       PT - Cognition Comments: Perseverating on groin and L foot pain. Following commands: Impaired Following commands impaired: Follows multi-step commands inconsistently    Cueing Cueing Techniques: Verbal cues, Tactile cues, Visual cues  Exercises Other Exercises Other Exercises: reviewed importance of LLE elevation with ankle above knee/hip for edema mgmt and ankle pumps, handout brought to room after session to reinforce.    General Comments General comments (skin integrity, edema, etc.): BP soft initially but after reciprocal STS BP improved; pt asymptomatic wtih soft BP. Pt encouraged to drink more water as urine in urinal appears very dark.      Pertinent Vitals/Pain Pain Assessment Pain Assessment: 0-10 Pain Score: 7  Pain Location: LLE with WB Pain Descriptors / Indicators: Pounding, Pressure, Grimacing, Discomfort, Operative site guarding Pain Intervention(s): Limited activity within patient's tolerance, Monitored during session, Premedicated before session, Repositioned, Other (comment) (LLE elevated wtih x2 pillows once back in recliner)    Home Living                          Prior Function            PT Goals (current goals can now be found in the care plan section) Acute Rehab PT Goals Patient Stated Goal: return to work, less pain PT Goal Formulation: With patient Time For Goal Achievement: 03/21/24 Progress towards PT goals: Progressing toward goals     Frequency    Min 2X/week      PT Plan      Co-evaluation              AM-PAC PT 6 Clicks Mobility   Outcome Measure  Help needed turning from your back to your side while in a flat bed without using bedrails?: A Little Help needed moving from lying on your back to sitting on the side of a flat bed without using bedrails?: A Little Help needed moving to and from a bed to a chair (including a wheelchair)?: A Little Help needed standing up from a chair using your arms (e.g., wheelchair or bedside chair)?: A Little Help needed to walk in hospital room?: A Little Help needed climbing 3-5 steps with a railing? : A Little 6 Click Score: 18    End of Session Equipment Utilized During Treatment: Gait belt;Other (comment) (darco forefoot offloading shoe) Activity Tolerance: Patient tolerated treatment well;Patient limited by pain;Other (comment) (soft BP but improves with standing activity) Patient left: in chair;with call bell/phone within reach;Other (comment);with chair alarm set (LLE elevated with x2 pillows for edema mgmt) Nurse Communication: Mobility status;Patient requests pain meds PT Visit Diagnosis: Other abnormalities of gait and mobility (R26.89);Unsteadiness on feet (R26.81);Pain Pain - Right/Left: Left Pain - part of body: Hip;Ankle  and joints of foot     Time: 8896-8862 PT Time Calculation (min) (ACUTE ONLY): 34 min  Charges:    $Gait Training: 8-22 mins $Therapeutic Activity: 8-22 mins PT General Charges $$ ACUTE PT VISIT: 1 Visit                     Toia Micale P., PTA Acute Rehabilitation Services Secure Chat Preferred 9a-5:30pm Office: [phone]    [name] HERO [facility] 03/08/2024, 12:17 PM

## 2024-03-08 NOTE — Plan of Care (Signed)
  Problem: Education: Goal: Knowledge of disease or condition will improve 03/08/2024 1739 by Ann Nena HERO, RN Outcome: Progressing 03/08/2024 1738 by Ann Nena HERO, RN Outcome: Progressing   Problem: Coping: Goal: Will identify appropriate support needs 03/08/2024 1739 by Ann Nena HERO, RN Outcome: Progressing 03/08/2024 1738 by Ann Nena HERO, RN Outcome: Progressing   Problem: Self-Care: Goal: Ability to participate in self-care as condition permits will improve Outcome: Progressing Goal: Verbalization of feelings and concerns over difficulty with self-care will improve 03/08/2024 1739 by Ann Nena HERO, RN Outcome: Progressing 03/08/2024 1738 by Ann Nena HERO, RN Outcome: Progressing Goal: Ability to communicate needs accurately will improve 03/08/2024 1739 by Ann Nena HERO, RN Outcome: Progressing 03/08/2024 1738 by Ann Nena HERO, RN Outcome: Progressing

## 2024-03-08 NOTE — Progress Notes (Signed)
 Physical Therapy Treatment Patient Details Name: Todd [name] MRN: [id number] DOB: 09/25/1965 Today's Date: 03/08/2024   History of Present Illness Pt is a 58 y/o male presenting on 8/4 with R sided weakness and slurred speech. 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  PMH includes: HTN, R shoulder surgery.    PT Comments  Pt received in chair, pt requesting assist to reposition and agreeable to education on BLE HEP handout. Pt with good participation as able in seated BLE/BUE exercises and requesting pain meds, he notified RN via call bell. Pt still had not drank water from his cup and pt encouraged to increase PO intake as his BP was low in AM and urine was very dark. Pt continues to benefit from PT services to progress toward functional mobility goals.     If plan is discharge home, recommend the following: Assistance with cooking/housework;Assist for transportation;Help with stairs or ramp for entrance   Can travel by private vehicle        Equipment Recommendations  Rolling walker (2 wheels)    Recommendations for Other Services       Precautions / Restrictions Precautions Precautions: Fall Recall of Precautions/Restrictions: Impaired Precaution/Restrictions Comments: fair recall of heel WB, needs min reminders for safe RW use Required Braces or Orthoses: Other Brace Other Brace: darco shoe Restrictions Weight Bearing Restrictions Per Provider Order: Yes LLE Weight Bearing Per Provider Order: Weight bearing as tolerated Other Position/Activity Restrictions: in Darco Shoe     Mobility  Bed Mobility               General bed mobility comments: Pt in recliner at beginning and end of session    Transfers Overall transfer level: Needs assistance Equipment  used: Rolling walker (2 wheels) Transfers: Sit to/from Stand Sit to Stand: Contact guard assist           General transfer comment: defer, seated LE exercise instruction focus for second session    Ambulation/Gait Ambulation/Gait assistance: Contact guard assist, Min assist Gait Distance (Feet): 30 Feet Assistive device: Rolling walker (2 wheels) Gait Pattern/deviations: Step-to pattern, Decreased stance time - left, Antalgic, Decreased weight shift to left, Decreased step length - right Gait velocity: decreased     General Gait Details: focus on second session on seated exercises   Stairs Stairs: Yes Stairs assistance: Contact guard assist Stair Management: Two rails, Step to pattern, Forwards, Backwards Number of Stairs: 4 General stair comments: pt ascended/descended single 7.5 platform step forward in room x3 reps, then backward up/down x1 rep for teachback of alternate method, pt receptive to instruction, CGA for safety but no buckling or LOB.   Wheelchair Mobility     Tilt Bed    Modified Rankin (Stroke Patients Only) Modified Rankin (Stroke Patients Only) Pre-Morbid Rankin Score: No significant disability Modified Rankin: Moderate disability     Balance Overall balance assessment: Needs assistance Sitting-balance support: No upper extremity supported, Feet supported Sitting balance-Leahy Scale: Good     Standing balance support: Bilateral upper extremity supported, Single extremity supported, During functional activity, Reliant on assistive device for balance Standing balance-Leahy Scale: Poor Standing balance comment: reliant on RW; poor unsupported  Communication Communication Communication: No apparent difficulties  Cognition Arousal: Alert Behavior During Therapy: WFL for tasks assessed/performed   PT - Cognitive impairments: Memory, Attention, Safety/Judgement, Sequencing                       PT  - Cognition Comments: Perseverating on groin and L foot pain. Following commands: Impaired Following commands impaired: Follows multi-step commands inconsistently    Cueing Cueing Techniques: Verbal cues, Tactile cues, Visual cues  Exercises Other Exercises Other Exercises: Reviewed General Strengthening Supine/Seated exercises with pt Other Exercises: seated BLE AROM: ankle pumps, hip ab/adduction (limited ROM within tolerance given groin incision), quad sets, glute sets, heel slides x5-10 ea depending on tolerance. Pt given handout to reinforce. Pt also encouraged to perform BUE bicycle motion when he is worried about BP droppping and drink more water as he had not appeared to drink anything from his AM cups and pt urine was very dark in AM.    General Comments General comments (skin integrity, edema, etc.): VSS on RA; discussion on LLE elevation for edema mgmt and positioning for comfort      Pertinent Vitals/Pain Pain Assessment Pain Assessment: 0-10 Pain Score: 7  Pain Location: LLE with AROM Pain Descriptors / Indicators: Pounding, Pressure, Grimacing, Discomfort, Operative site guarding Pain Intervention(s): Limited activity within patient's tolerance, Monitored during session, Premedicated before session, Repositioned    Home Living                          Prior Function            PT Goals (current goals can now be found in the care plan section) Acute Rehab PT Goals Patient Stated Goal: return to work, less pain PT Goal Formulation: With patient Time For Goal Achievement: 03/21/24 Progress towards PT goals: Progressing toward goals    Frequency    Min 2X/week      PT Plan      Co-evaluation              AM-PAC PT 6 Clicks Mobility   Outcome Measure  Help needed turning from your back to your side while in a flat bed without using bedrails?: A Little Help needed moving from lying on your back to sitting on the side of a flat bed  without using bedrails?: A Little Help needed moving to and from a bed to a chair (including a wheelchair)?: A Little Help needed standing up from a chair using your arms (e.g., wheelchair or bedside chair)?: A Little Help needed to walk in hospital room?: A Little Help needed climbing 3-5 steps with a railing? : A Little 6 Click Score: 18    End of Session Equipment Utilized During Treatment: Gait belt;Other (comment) (darco forefoot offloading shoe) Activity Tolerance: Patient limited by pain;Other (comment) (requesting pain meds) Patient left: in chair;with call bell/phone within reach;Other (comment);with chair alarm set (LLE elevated with x2 pillows for edema mgmt) Nurse Communication: Mobility status;Patient requests pain meds PT Visit Diagnosis: Other abnormalities of gait and mobility (R26.89);Unsteadiness on feet (R26.81);Pain Pain - Right/Left: Left Pain - part of body: Hip;Ankle and joints of foot     Time: 8691-8683 PT Time Calculation (min) (ACUTE ONLY): 8 min  Charges:    $Therapeutic Exercise: 8-22 mins PT General Charges $$ ACUTE PT VISIT: 1 Visit  [name] SQUIBB., PTA Acute Rehabilitation Services Secure Chat Preferred 9a-5:30pm Office: [phone]    [name] HERO [facility] 03/08/2024, 2:16 PM

## 2024-03-08 NOTE — TOC Progression Note (Signed)
 Transition of Care (TOC) - Progression Note  Rayfield Gobble RN, BSN Inpatient Care Management Unit 4E- RN Case Manager See Treatment Team for direct phone #   Patient Details  Name: Todd [name] MRN: [id number] Date of Birth: 03/08/1966  Transition of Care [facility]) CM/SW Contact  Gobble Rayfield Hurst, RN Phone Number: 03/08/2024, 3:08 PM  Clinical Narrative:    Follow up done with pt for [facility] and DME needs. Orders placed for HHPT/OT and DME- RW.  Pt voiced that he would like to see if [facility] can service him for [facility] needs if not then he is agreeable to any of the other high star rated agencies on the Medicare list.  No preference for DME provider.   Call made to [facility] liaison- Zebedee-  liaison has confirmed they can take pt's insurance and have available staffing- [facility] has accepted referral for [facility] needs and will follow under [facility] office protocol for additional needs.   Referral for DME- RW sent to [facility]- RW to be delivered to pt prior to discharge.    Expected Discharge Plan: OP Rehab Barriers to Discharge: Continued Medical Work up               Expected Discharge Plan and Services   Discharge Planning Services: CM Consult Post Acute Care Choice: Home Health, Durable Medical Equipment Living arrangements for the past 2 months: Single Family Home                 DME Arranged: Walker rolling DME Agency: [facility] Date DME Agency Contacted: 03/08/24 Time DME Agency Contacted: 1408 Representative spoke with at DME Agency: Zack HH Arranged: PT, OT HH Agency: [facility] ([facility]) Date HH Agency Contacted: 03/08/24 Time HH Agency Contacted: 1408 Representative spoke with at [facility] Agency: [facility]eb[facility]   Social Drivers of Health (SDOH) Interventions SDOH Screenings   Food Insecurity: No Food Insecurity (02/29/2024)  Housing: Low Risk  (02/29/2024)  Transportation Needs: No Transportation Needs (02/29/2024)  Utilities: Not At Risk (02/29/2024)  Alcohol Screen:  Low Risk  (10/05/2022)  Depression (PHQ2-9): Low Risk  (10/06/2023)  Financial Resource Strain: Low Risk  (10/05/2022)  Physical Activity: Sufficiently Active (10/05/2022)  Social Connections: Socially Isolated (10/05/2022)  Stress: No Stress Concern Present (04/07/2023)  Tobacco Use: High Risk (03/06/2024)  Health Literacy: Adequate Health Literacy (04/07/2023)    Readmission Risk Interventions     No data to display

## 2024-03-08 NOTE — Plan of Care (Signed)
?  Problem: Education: ?Goal: Knowledge of disease or condition will improve ?Outcome: Progressing ?  ?Problem: Coping: ?Goal: Will identify appropriate support needs ?Outcome: Progressing ?  ?Problem: Self-Care: ?Goal: Verbalization of feelings and concerns over difficulty with self-care will improve ?Outcome: Progressing ?Goal: Ability to communicate needs accurately will improve ?Outcome: Progressing ?  ?

## 2024-03-09 ENCOUNTER — Other Ambulatory Visit (HOSPITAL_COMMUNITY): Payer: Self-pay

## 2024-03-09 ENCOUNTER — Telehealth (HOSPITAL_COMMUNITY): Payer: Self-pay | Admitting: Pharmacy Technician

## 2024-03-09 ENCOUNTER — Other Ambulatory Visit: Payer: Self-pay

## 2024-03-09 DIAGNOSIS — R911 Solitary pulmonary nodule: Secondary | ICD-10-CM | POA: Diagnosis not present

## 2024-03-09 DIAGNOSIS — Z95828 Presence of other vascular implants and grafts: Secondary | ICD-10-CM

## 2024-03-09 DIAGNOSIS — I7143 Infrarenal abdominal aortic aneurysm, without rupture: Secondary | ICD-10-CM | POA: Diagnosis not present

## 2024-03-09 DIAGNOSIS — Z95820 Peripheral vascular angioplasty status with implants and grafts: Secondary | ICD-10-CM

## 2024-03-09 DIAGNOSIS — I739 Peripheral vascular disease, unspecified: Secondary | ICD-10-CM | POA: Diagnosis not present

## 2024-03-09 DIAGNOSIS — Z89412 Acquired absence of left great toe: Secondary | ICD-10-CM

## 2024-03-09 DIAGNOSIS — I6523 Occlusion and stenosis of bilateral carotid arteries: Secondary | ICD-10-CM | POA: Diagnosis not present

## 2024-03-09 LAB — TYPE AND SCREEN
ABO/RH(D): O POS
Antibody Screen: NEGATIVE
Unit division: 0
Unit division: 0
Unit division: 0
Unit division: 0

## 2024-03-09 LAB — CBC
HCT: 23.1 % — ABNORMAL LOW (ref 39.0–52.0)
HCT: 25 % — ABNORMAL LOW (ref 39.0–52.0)
Hemoglobin: 7.7 g/dL — ABNORMAL LOW (ref 13.0–17.0)
Hemoglobin: 8.3 g/dL — ABNORMAL LOW (ref 13.0–17.0)
MCH: 29.2 pg (ref 26.0–34.0)
MCH: 29.3 pg (ref 26.0–34.0)
MCHC: 33.3 g/dL (ref 30.0–36.0)
MCHC: 33.2 g/dL (ref 30.0–36.0)
MCV: 87.5 fL (ref 80.0–100.0)
MCV: 88.3 fL (ref 80.0–100.0)
Platelets: 211 K/uL (ref 150–400)
Platelets: 229 K/uL (ref 150–400)
RBC: 2.64 MIL/uL — ABNORMAL LOW (ref 4.22–5.81)
RBC: 2.83 MIL/uL — ABNORMAL LOW (ref 4.22–5.81)
RDW: 16.8 % — ABNORMAL HIGH (ref 11.5–15.5)
RDW: 16.3 % — ABNORMAL HIGH (ref 11.5–15.5)
WBC: 9.5 K/uL (ref 4.0–10.5)
WBC: 9.7 K/uL (ref 4.0–10.5)
nRBC: 0 % (ref 0.0–0.2)
nRBC: 0 % (ref 0.0–0.2)

## 2024-03-09 LAB — BPAM RBC
Blood Product Expiration Date: 202509052359
Blood Product Expiration Date: 202509062359
Blood Product Expiration Date: 202509072359
Blood Product Expiration Date: 202509092359
ISSUE DATE / TIME: 202508121546
ISSUE DATE / TIME: 202508121546
ISSUE DATE / TIME: 202508121610
ISSUE DATE / TIME: 202508121610
Unit Type and Rh: 5100
Unit Type and Rh: 5100
Unit Type and Rh: 5100
Unit Type and Rh: 5100

## 2024-03-09 LAB — BASIC METABOLIC PANEL WITH GFR
Anion gap: 10 (ref 5–15)
BUN: 13 mg/dL (ref 6–20)
CO2: 21 mmol/L — ABNORMAL LOW (ref 22–32)
Calcium: 8.2 mg/dL — ABNORMAL LOW (ref 8.9–10.3)
Chloride: 99 mmol/L (ref 98–111)
Creatinine, Ser: 0.78 mg/dL (ref 0.61–1.24)
GFR, Estimated: >60 mL/min (ref >60)
Glucose, Bld: 101 mg/dL — ABNORMAL HIGH (ref 70–99)
Potassium: 3.7 mmol/L (ref 3.5–5.1)
Sodium: 130 mmol/L — ABNORMAL LOW (ref 135–145)

## 2024-03-09 MED ORDER — GABAPENTIN 300 MG PO CAPS
300.0000 mg | ORAL_CAPSULE | Freq: Three times a day (TID) | ORAL | 0 refills | Status: DC
  Filled 2024-03-09: qty 270, 90d supply, fill #0

## 2024-03-09 MED ORDER — CLOPIDOGREL BISULFATE 75 MG PO TABS
75.0000 mg | ORAL_TABLET | Freq: Every day | ORAL | 0 refills | Status: DC
  Filled 2024-03-09: qty 90, 90d supply, fill #0

## 2024-03-09 MED ORDER — SENNOSIDES-DOCUSATE SODIUM 8.6-50 MG PO TABS: 1.0000 | ORAL_TABLET | Freq: Two times a day (BID) | ORAL | Status: DC | PRN

## 2024-03-09 MED ORDER — NICOTINE 21 MG/24HR TD PT24: 21.0000 mg | MEDICATED_PATCH | Freq: Every day | TRANSDERMAL | Status: DC

## 2024-03-09 MED ORDER — ACETAMINOPHEN 325 MG PO TABS: 650.0000 mg | ORAL_TABLET | Freq: Four times a day (QID) | ORAL | Status: DC | PRN

## 2024-03-09 MED ORDER — HYDROMORPHONE HCL 2 MG PO TABS
2.0000 mg | ORAL_TABLET | ORAL | Status: DC | PRN
  Administered 2024-03-09 (×2): 4 mg via ORAL
  Administered 2024-03-09: 2 mg via ORAL
  Administered 2024-03-10 (×2): 4 mg via ORAL
  Filled 2024-03-09: qty 1
  Filled 2024-03-09: qty 2
  Filled 2024-03-09 (×2): qty 1
  Filled 2024-03-09 (×2): qty 2

## 2024-03-09 MED ORDER — HYDROMORPHONE HCL 2 MG PO TABS
2.0000 mg | ORAL_TABLET | Freq: Four times a day (QID) | ORAL | 0 refills | Status: DC | PRN
  Filled 2024-03-09: qty 30, 4d supply, fill #0

## 2024-03-09 MED ORDER — POLYETHYLENE GLYCOL 3350 17 G PO PACK
17.0000 g | PACK | Freq: Two times a day (BID) | ORAL | Status: DC
  Administered 2024-03-09: 17 g via ORAL
  Filled 2024-03-09 (×2): qty 1

## 2024-03-09 MED ORDER — DOXYCYCLINE HYCLATE 100 MG PO TABS
100.0000 mg | ORAL_TABLET | Freq: Two times a day (BID) | ORAL | 0 refills | Status: AC
  Filled 2024-03-09: qty 20, 10d supply, fill #0

## 2024-03-09 MED ORDER — MAGNESIUM CITRATE PO SOLN
1.0000 | Freq: Once | ORAL | Status: AC
  Administered 2024-03-09: 1 via ORAL
  Filled 2024-03-09: qty 296

## 2024-03-09 MED ORDER — ASPIRIN 81 MG PO TBEC: 81.0000 mg | DELAYED_RELEASE_TABLET | Freq: Every day | ORAL | Status: AC

## 2024-03-09 MED ORDER — DOXYCYCLINE HYCLATE 100 MG PO TABS
100.0000 mg | ORAL_TABLET | Freq: Two times a day (BID) | ORAL | Status: DC
  Administered 2024-03-09 – 2024-03-10 (×3): 100 mg via ORAL
  Filled 2024-03-09 (×3): qty 1

## 2024-03-09 MED ORDER — OXYCODONE HCL 5 MG PO TABS
5.0000 mg | ORAL_TABLET | Freq: Four times a day (QID) | ORAL | 0 refills | Status: DC | PRN
  Filled 2024-03-09: qty 40, 5d supply, fill #0

## 2024-03-09 MED ORDER — ATORVASTATIN CALCIUM 40 MG PO TABS
40.0000 mg | ORAL_TABLET | Freq: Every day | ORAL | 3 refills | Status: DC
Start: 2024-03-09 — End: 2024-04-06
  Filled 2024-03-09: qty 90, 90d supply, fill #0

## 2024-03-09 MED ORDER — SENNOSIDES-DOCUSATE SODIUM 8.6-50 MG PO TABS
2.0000 | ORAL_TABLET | Freq: Two times a day (BID) | ORAL | Status: DC
  Administered 2024-03-09 (×2): 2 via ORAL
  Filled 2024-03-09 (×3): qty 2

## 2024-03-09 NOTE — Progress Notes (Signed)
 Physical Therapy Treatment Patient Details Name: Todd [name] MRN: [id number] DOB: 18-Apr-1966 Today's Date: 03/09/2024   History of Present Illness Pt is a 58 y/o male presenting on 8/4 with R sided weakness and slurred speech. 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  PMH includes: HTN, R shoulder surgery.    PT Comments  Pt received in recliner with L knee flexed, agreeable to therapy session, PTA reinforcing importance of L knee extended and LLE elevated with ankle above knee for edema mgmt at rest, pt receptive. Pt performing transfers and gait with RW support with Supervision this date, with improved safety with RW management and good compliance with L heel WB pattern with darco shoe donned. Pt had LLE wrapped as per RN, increased drainage from his incisions today, and some foul odor noted at surgical site, RN aware. Pt making good progress toward functional mobility goals this date, anticipate pt safe to DC home with assist from sig other and HHPT once medically cleared, will continue to follow acutely.   If plan is discharge home, recommend the following: Assistance with cooking/housework;Assist for transportation;Help with stairs or ramp for entrance   Can travel by private vehicle        Equipment Recommendations  Rolling walker (2 wheels)    Recommendations for Other Services       Precautions / Restrictions Precautions Precautions: Fall Recall of Precautions/Restrictions: Intact Precaution/Restrictions Comments: improving recall of precs Required Braces or Orthoses: Other Brace Other Brace: darco shoe Restrictions Weight Bearing Restrictions Per Provider Order: Yes LLE Weight Bearing Per Provider Order: Weight bearing as tolerated Other Position/Activity  Restrictions: in Darco Shoe     Mobility  Bed Mobility               General bed mobility comments: Pt in recliner at beginning and in chair in front of sink at end of session    Transfers Overall transfer level: Needs assistance Equipment used: Rolling walker (2 wheels) Transfers: Sit to/from Stand Sit to Stand: Supervision           General transfer comment: from recliner>RW and RW> chair without arm rests    Ambulation/Gait Ambulation/Gait assistance: Supervision Gait Distance (Feet): 50 Feet Assistive device: Rolling walker (2 wheels) Gait Pattern/deviations: Step-to pattern, Decreased weight shift to left, Decreased step length - right Gait velocity: decreased Gait velocity interpretation: <1.31 ft/sec, indicative of household ambulator   General Gait Details: Min initial cues for safety wtih RW (not to lift off floor), improved L knee flexion during swing phase (pt tending to hold L knee extended initially) and step-to sequencing for heel WB with good carryover. Pt tending to hunch shoulders, cues to relax shoulders and for forward gaze/not to look down, fair carryover but may need some reinforcement of those cues. Cues for activity pacing. Pt agreeable to sit up at sink to prepare for bath wtih NT assist at end of session.   Stairs         General stair comments: pt able to verbalize proper step-to pattern without verbal reminders, defer practice as pt performed well previous session.   Wheelchair Mobility     Tilt Bed    Modified Rankin (Stroke Patients Only) Modified Rankin (Stroke Patients Only) Pre-Morbid Rankin Score: No significant disability Modified Rankin: Moderate disability  Balance Overall balance assessment: Needs assistance Sitting-balance support: No upper extremity supported, Feet supported Sitting balance-Leahy Scale: Good     Standing balance support: Bilateral upper extremity supported, Single extremity supported, During  functional activity, Reliant on assistive device for balance Standing balance-Leahy Scale: Poor Standing balance comment: reliant on RW; poor unsupported                            Communication Communication Communication: No apparent difficulties Factors Affecting Communication: Reduced clarity of speech  Cognition Arousal: Alert Behavior During Therapy: WFL for tasks assessed/performed   PT - Cognitive impairments: Memory, Attention, Safety/Judgement, Sequencing                       PT - Cognition Comments: Improved recall of RW use, pt needs some reminders for improved posture resting and while standing. Following commands: Impaired Following commands impaired: Follows multi-step commands inconsistently    Cueing Cueing Techniques: Verbal cues  Exercises Other Exercises Other Exercises: pt has handout from previous sesison    General Comments General comments (skin integrity, edema, etc.): VSS on RA      Pertinent Vitals/Pain Pain Assessment Pain Assessment: 0-10 Pain Score: 7  Pain Location: LLE with weight bearing Pain Descriptors / Indicators: Pounding, Pressure, Grimacing, Discomfort, Operative site guarding Pain Intervention(s): Limited activity within patient's tolerance, Monitored during session, Premedicated before session, Repositioned    Home Living                          Prior Function            PT Goals (current goals can now be found in the care plan section) Acute Rehab PT Goals Patient Stated Goal: Return to work, less pain PT Goal Formulation: With patient Time For Goal Achievement: 03/21/24 Progress towards PT goals: Progressing toward goals    Frequency    Min 2X/week      PT Plan      Co-evaluation              AM-PAC PT 6 Clicks Mobility   Outcome Measure  Help needed turning from your back to your side while in a flat bed without using bedrails?: None Help needed moving from lying on  your back to sitting on the side of a flat bed without using bedrails?: A Little Help needed moving to and from a bed to a chair (including a wheelchair)?: A Little Help needed standing up from a chair using your arms (e.g., wheelchair or bedside chair)?: A Little Help needed to walk in hospital room?: A Little Help needed climbing 3-5 steps with a railing? : A Little 6 Click Score: 19    End of Session Equipment Utilized During Treatment: Gait belt;Other (comment) (darco forefoot offloading shoe) Activity Tolerance: Patient tolerated treatment well;Patient limited by pain Patient left: in chair;with call bell/phone within reach;Other (comment) (in front of sink, NT arriving to room to set pt up for a bath) Nurse Communication: Mobility status PT Visit Diagnosis: Other abnormalities of gait and mobility (R26.89);Unsteadiness on feet (R26.81);Pain Pain - Right/Left: Left Pain - part of body: Hip;Ankle and joints of foot     Time: 8981-8961 PT Time Calculation (min) (ACUTE ONLY): 20 min  Charges:    $Gait Training: 8-22 mins PT General Charges $$ ACUTE PT VISIT: 1 Visit  [name] SQUIBB., PTA Acute Rehabilitation Services Secure Chat Preferred 9a-5:30pm Office: [phone]    [name] HERO [facility] 03/09/2024, 10:59 AM

## 2024-03-09 NOTE — Progress Notes (Signed)
 Occupational Therapy Treatment Patient Details Name: Todd [name] MRN: [id number] DOB: [birth year] Today's Date: 03/09/2024   History of present illness Pt is a 58 y/o male presenting on 8/4 with R sided weakness and slurred speech. 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  PMH includes: HTN, R shoulder surgery.   OT comments  Pt progressing well towards goals. Progressed to complete standing ADLs with supervision. Improved standing balance, with both single and no UE support. Pt continues to be limited in LB dressing d/t pain. Pt would benefit from  HHOT to optimize independence levels. Will continue to follow acutely.       If plan is discharge home, recommend the following:  A little help with walking and/or transfers;A little help with bathing/dressing/bathroom;Assistance with cooking/housework;Assist for transportation;Direct supervision/assist for medications management;Direct supervision/assist for financial management;Supervision due to cognitive status;A lot of help with bathing/dressing/bathroom   Equipment Recommendations  Tub/shower seat       Precautions / Restrictions Precautions Precautions: Fall Recall of Precautions/Restrictions: Intact Precaution/Restrictions Comments: improving recall of precs Required Braces or Orthoses: Other Brace Other Brace: darco shoe Restrictions Weight Bearing Restrictions Per Provider Order: Yes (Simultaneous filing. User may not have seen previous data.) LLE Weight Bearing Per Provider Order: Weight bearing as tolerated (Simultaneous filing. User may not have seen previous data.) Other Position/Activity Restrictions: in Darco Shoe       Mobility Bed Mobility     General bed mobility comments: Pt received in recliner     Transfers Overall transfer level: Needs assistance Equipment used: Rolling walker (2 wheels) Transfers: Sit to/from Stand, Bed to chair/wheelchair/BSC Sit to Stand: Supervision     Step pivot transfers: Supervision     General transfer comment: S for safety, good ability to maintain WB precautions     Balance Overall balance assessment: Needs assistance Sitting-balance support: No upper extremity supported, Feet supported Sitting balance-Leahy Scale: Good     Standing balance support: Bilateral upper extremity supported, Single extremity supported, During functional activity, Reliant on assistive device for balance Standing balance-Leahy Scale: Fair Standing balance comment: Can complete standing tasks without UE support, RW for mobility         ADL either performed or assessed with clinical judgement   ADL Overall ADL's : Needs assistance/impaired     Grooming: Supervision/safety;Standing;Wash/dry face;Oral care Grooming Details (indicate cue type and reason): Standing at sink             Lower Body Dressing: Minimal assistance;Sit to/from stand Lower Body Dressing Details (indicate cue type and reason): Min assist to thread legs over LLE and don post op shoe Toilet Transfer: Supervision/safety;Ambulation;Rolling walker (2 wheels) Toilet Transfer Details (indicate cue type and reason): Simulated in room           General ADL Comments: Completing standing ADLs with both single UE and no UE support, good ability to maintain WB precautions    Extremity/Trunk Assessment Upper Extremity Assessment Upper Extremity Assessment: RUE deficits/detail RUE Deficits / Details: Improved bil coordination, still with mild dysmetria, but overall WFL for ADLs RUE Sensation: WNL RUE Coordination: decreased fine motor;decreased gross motor   Lower Extremity Assessment Lower Extremity Assessment: Defer to PT evaluation                 Communication  Communication  Communication: No apparent difficulties Factors Affecting Communication: Reduced clarity of speech   Cognition Arousal: Alert Behavior During Therapy: WFL for tasks assessed/performed Cognition: Cognition impaired     Awareness: Online awareness impaired Memory impairment (select all impairments): Working [employer] fast streamer, Short-term memory Attention impairment (select first level of impairment): Selective attention Executive functioning impairment (select all impairments): Problem solving, Organization       Following commands: Impaired Following commands impaired: Follows multi-step commands inconsistently      Cueing   Cueing Techniques: Verbal cues        General Comments VSS on RA    Pertinent Vitals/ Pain       Pain Assessment Pain Assessment: 0-10 Pain Score: 8  Pain Location: LLE Pain Descriptors / Indicators: Pounding, Pressure, Grimacing, Discomfort, Operative site guarding Pain Intervention(s): Monitored during session   Frequency  Min 2X/week        Progress Toward Goals  OT Goals(current goals can now be found in the care plan section)  Progress towards OT goals: Progressing toward goals  Acute Rehab OT Goals Patient Stated Goal: To go home OT Goal Formulation: With patient Time For Goal Achievement: 03/13/24 Potential to Achieve Goals: Good ADL Goals Pt Will Perform Grooming: with modified independence;standing Pt Will Perform Lower Body Dressing: with modified independence;sit to/from stand Pt Will Transfer to Toilet: with modified independence;ambulating Pt/caregiver will Perform Home Exercise Program: With written HEP provided;Right Upper extremity Additional ADL Goal #1: Pt will complete pill box test with no more than 3 errors.  Plan         AM-PAC OT 6 Clicks Daily Activity     Outcome Measure   Help from another person eating meals?: None Help from another person taking care of personal grooming?: A Little Help from another  person toileting, which includes using toliet, bedpan, or urinal?: A Little Help from another person bathing (including washing, rinsing, drying)?: A Little Help from another person to put on and taking off regular upper body clothing?: A Little Help from another person to put on and taking off regular lower body clothing?: A Little 6 Click Score: 19    End of Session Equipment Utilized During Treatment: Gait belt;Rolling walker (2 wheels)  OT Visit Diagnosis: Other abnormalities of gait and mobility (R26.89);Pain Pain - Right/Left: Left Pain - part of body: Ankle and joints of foot   Activity Tolerance Patient tolerated treatment well   Patient Left in chair;with call bell/phone within reach   Nurse Communication Mobility status        Time: 9060-8993 OT Time Calculation (min): 27 min  Charges: OT General Charges $OT Visit: 1 Visit OT Treatments $Self Care/Home Management : 23-37 mins  Adrianne BROCKS, OT  Acute Rehabilitation Services Office [phone] Secure chat preferred   Adrianne GORMAN [employer] 03/09/2024, 11:33 AM

## 2024-03-09 NOTE — Progress Notes (Signed)
 AVS completed for discharge packet and placed with chart.  Reviewed patient chart/labs and discovered patient has not had a BM since 02/29/24 and Hem lab was 7.7 as of this morning at 10:13 am. Messaged provider T.Gonfa,MD to confirm we need to continue with discharge.   T.Gonfa,MD responded; Let as keep him today and watch his Hgb. Ordered Senna and Miralax .  Informed primary RN and Charge RN patient discharged has been cancelled and of MD orders.

## 2024-03-09 NOTE — Progress Notes (Addendum)
  Progress Note    03/09/2024 7:47 AM 3 Days Post-Op  Subjective:  L GT amp site pain   Vitals:   03/08/24 2320 03/09/24 0352  BP: (!) 106/59 112/66  Pulse: 98 89  Resp: 11 16  Temp: 98.4 F (36.9 C) 98 F (36.7 C)  SpO2: 99% 97%   Physical Exam: Lungs:  non labored Incisions:  L neck healing well; bruising both groins but no hematoma; L groin incision c/d/I; L GT redness Extremities:  L PT, DP brisk by doppler Neurologic: A&O; CN grossly intact  CBC    Component Value Date/Time   WBC 14.2 (H) 03/07/2024 0312   RBC 3.64 (L) 03/07/2024 0312   HGB 10.6 (L) 03/07/2024 0312   HGB 13.7 10/06/2023 1530   HCT 31.0 (L) 03/07/2024 0312   HCT 40.7 10/06/2023 1530   PLT 155 03/07/2024 0312   PLT 231 10/06/2023 1530   MCV 85.2 03/07/2024 0312   MCV 97 10/06/2023 1530   MCH 29.1 03/07/2024 0312   MCHC 34.2 03/07/2024 0312   RDW 18.0 (H) 03/07/2024 0312   RDW 12.5 10/06/2023 1530   LYMPHSABS 1.8 02/27/2024 1426   LYMPHSABS 2.6 10/06/2023 1530   MONOABS 0.9 02/27/2024 1426   EOSABS 0.0 02/27/2024 1426   EOSABS 0.3 10/06/2023 1530   BASOSABS 0.1 02/27/2024 1426   BASOSABS 0.1 10/06/2023 1530    BMET    Component Value Date/Time   NA 130 (L) 03/09/2024 0326   NA 136 10/06/2023 1530   K 3.7 03/09/2024 0326   CL 99 03/09/2024 0326   CO2 21 (L) 03/09/2024 0326   GLUCOSE 101 (H) 03/09/2024 0326   BUN 13 03/09/2024 0326   BUN 16 10/06/2023 1530   CREATININE 0.78 03/09/2024 0326   CALCIUM  8.2 (L) 03/09/2024 0326   GFRNONAA >60 03/09/2024 0326   GFRAA 112 02/16/2018 0939    INR    Component Value Date/Time   INR 1.6 (H) 03/06/2024 1924     Intake/Output Summary (Last 24 hours) at 03/09/2024 0747 Last data filed at 03/08/2024 1937 Gross per 24 hour  Intake --  Output 710 ml  Net -710 ml     Assessment/Plan:  58 y.o. male is s/p TCAR, EVAR, L CFA endarterectomy, SFA/pop stenting, GT amp  3 Days Post-Op   No neuro events overnight BLE well perfused by  doppler exam  Incisions are well appearing; ok to cleanse with soap and water then pat dry; start course of doxycycline  Continue aspirin , plavix , statin Office will arrange follow up in 2-3 weeks; ok for discharge   Donnice Sender, PA-C [facility] [phone] 03/09/2024 7:47 AM  I have independently interviewed and examined patient and agree with PA assessment and plan above.  Will allow right foot to continue to demarcate.  He can clean this with soap and water is okay for showering at the site of all of his incisions at this time.  Will arrange follow-up in our office for wound check in a couple weeks and will need CT angio to evaluate his aortic graft and left renal stent followed by left carotid duplex and ABIs.  Bralon Antkowiak C. Sheree, MD [facility] [place] [place] Office: [phone] Pager: [phone]

## 2024-03-09 NOTE — Telephone Encounter (Signed)
 Clinical Questions have been submitted

## 2024-03-09 NOTE — Telephone Encounter (Signed)
 Pharmacy Patient Advocate Encounter  Received notification from [facility] that Prior Authorization for oxyCODONE  HCl 5MG  tablets has been APPROVED from 03/09/2024 to 04/09/2024   PA #/Case ID/Reference #: [id number]

## 2024-03-09 NOTE — Telephone Encounter (Signed)
 Pharmacy Patient Advocate Encounter   Received notification from Inpatient Request that prior authorization for oxyCODONE  HCl 5MG  tablets  is required/requested.   Insurance verification completed.   The patient is insured through [facility] .   Per test claim: PA required; PA started via CoverMyMeds. KEY [id number] . Waiting for clinical questions to populate.
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
## 2024-03-10 ENCOUNTER — Other Ambulatory Visit (HOSPITAL_COMMUNITY): Payer: Self-pay

## 2024-03-10 DIAGNOSIS — R911 Solitary pulmonary nodule: Secondary | ICD-10-CM | POA: Diagnosis not present

## 2024-03-10 DIAGNOSIS — I739 Peripheral vascular disease, unspecified: Secondary | ICD-10-CM | POA: Diagnosis not present

## 2024-03-10 DIAGNOSIS — I6523 Occlusion and stenosis of bilateral carotid arteries: Secondary | ICD-10-CM | POA: Diagnosis not present

## 2024-03-10 DIAGNOSIS — I7143 Infrarenal abdominal aortic aneurysm, without rupture: Secondary | ICD-10-CM | POA: Diagnosis not present

## 2024-03-10 LAB — CBC
HCT: 25 % — ABNORMAL LOW (ref 39.0–52.0)
Hemoglobin: 8.2 g/dL — ABNORMAL LOW (ref 13.0–17.0)
MCH: 29.4 pg (ref 26.0–34.0)
MCHC: 32.8 g/dL (ref 30.0–36.0)
MCV: 89.6 fL (ref 80.0–100.0)
Platelets: 252 K/uL (ref 150–400)
RBC: 2.79 MIL/uL — ABNORMAL LOW (ref 4.22–5.81)
RDW: 16.3 % — ABNORMAL HIGH (ref 11.5–15.5)
WBC: 10.3 K/uL (ref 4.0–10.5)
nRBC: 0 % (ref 0.0–0.2)

## 2024-03-10 MED ORDER — GABAPENTIN 300 MG PO CAPS
600.0000 mg | ORAL_CAPSULE | Freq: Three times a day (TID) | ORAL | 0 refills | Status: DC
  Filled 2024-03-10: qty 180, 30d supply, fill #0

## 2024-03-10 MED ORDER — GABAPENTIN 300 MG PO CAPS
600.0000 mg | ORAL_CAPSULE | Freq: Three times a day (TID) | ORAL | Status: DC
  Administered 2024-03-10: 600 mg via ORAL
  Filled 2024-03-10: qty 2

## 2024-03-10 NOTE — Discharge Summary (Signed)
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 canal: No prevertebral  fluid or swelling. No visible canal hematoma. 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 Disc levels: Intervertebral disc spaces are relatively maintained. Mild degenerative endplate osteophytes. No high-grade osseous spinal canal stenosis. 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 No high-grade osseous foraminal stenosis. Upper chest: Paraseptal emphysema in the lung apices. Other: None. IMPRESSION: No CT evidence of acute intracranial abnormality. No acute fracture or traumatic malalignment of the cervical spine. 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 Electronically Signed   By: Donnice Mania M.D.   On: 02/20/2024 12:37       The results of significant diagnostics from this hospitalization (including imaging, microbiology, ancillary and laboratory) are listed below for reference.     Microbiology: Recent Results (from the past 240 hours)  Surgical pcr screen     Status: None   Collection Time: 03/02/24  1:45 AM   Specimen: Nasal Mucosa; Nasal Swab  Result Value Ref Range Status   MRSA, PCR NEGATIVE NEGATIVE Final  Staphylococcus aureus NEGATIVE NEGATIVE Final    Comment: (NOTE) The Xpert SA Assay (FDA approved for NASAL specimens in patients 51 years of age and older), is one component of a comprehensive surveillance program. It is not intended to diagnose infection nor to guide or monitor treatment. Performed at [facility], [address], [place], [place] [address]      Labs:  CBC: Recent Labs  Lab 03/06/24 [phone] 03/07/24 0312 03/09/24 0821 03/09/24 1530 03/10/24 0330  WBC 10.6* 14.2* 9.5 9.7 10.3  HGB 10.5* 10.6* 7.7* 8.3* 8.2*  HCT 31.2* 31.0* 23.1* 25.0* 25.0*  MCV 85.7 85.2 87.5 88.3 89.6  PLT 147* 155 211 229 252   BMP &GFR Recent Labs  Lab 03/06/24 1924 03/07/24 0312 03/07/24 0314 03/08/24 0254 03/09/24 0326  NA 133* 133* 133* 131* 130*  K 4.0 4.2 4.2 3.8 3.7  CL 103  104 101 101 99  CO2 19* 18* 18* 19* 21*  GLUCOSE 153* 148* 147* 110* 101*  BUN 14 13 15  24* 13  CREATININE 0.84 0.97 1.05 1.13 0.78  CALCIUM  7.9* 8.3* 8.5* 8.1* 8.2*  MG 1.8 1.7  --   --   --   PHOS  --   --  3.0  --   --    Estimated Creatinine Clearance: 128 mL/min (by C-G formula based on SCr of 0.78 mg/dL). Liver & Pancreas: Recent Labs  Lab 03/07/24 0314  ALBUMIN  3.3*   No results for input(s): LIPASE, AMYLASE in the last 168 hours. No results for input(s): AMMONIA in the last 168 hours. Diabetic: No results for input(s): HGBA1C in the last 72 hours. No results for input(s): GLUCAP in the last 168 hours. Cardiac Enzymes: No results for input(s): CKTOTAL, CKMB, CKMBINDEX, TROPONINI in the last 168 hours. No results for input(s): PROBNP in the last 8760 hours. Coagulation Profile: Recent Labs  Lab 03/06/24 1924  INR 1.6*   Thyroid Function Tests: No results for input(s): TSH, T4TOTAL, FREET4, T3FREE, THYROIDAB in the last 72 hours. Lipid Profile: No results for input(s): CHOL, HDL, LDLCALC, TRIG, CHOLHDL, LDLDIRECT in the last 72 hours. Anemia Panel: No results for input(s): VITAMINB12, FOLATE, FERRITIN, TIBC, IRON, RETICCTPCT in the last 72 hours. Urine analysis: No results found for: COLORURINE, APPEARANCEUR, LABSPEC, PHURINE, GLUCOSEU, HGBUR, BILIRUBINUR, KETONESUR, PROTEINUR, UROBILINOGEN, NITRITE, LEUKOCYTESUR Sepsis Labs: Invalid input(s): PROCALCITONIN, LACTICIDVEN   SIGNED:  Jawara Latorre T Jaliza Seifried, MD  [facility] 03/10/2024, 3:15 PM

## 2024-03-10 NOTE — Progress Notes (Signed)
 DC order noted per MD. DC RN at bedside. Med details completed. Primary RN notified, completed last wound care dressing change, provided patient with additional supplies. Per CM, HH scheduled for f/u with ambulatory rehab referrals. TOC meds delivered to bedside. AVS printed/reviewed. Instructed patient on importance of adherence to meds for stroke prevention, fall prevention and RW use given fall risk. Patient partner to arrive for transport in 1hr, agreeable to transport for the discharge lounge. All belongings accounted for, PIV removed. Patient discharged off the unit, taken to dc lounge.

## 2024-03-10 NOTE — TOC CM/SW Note (Addendum)
 Pt has been DC. He reports that he doesn't need the RW. He is going to borrow a walker from a friend. Contacted Ada at [facility] and cancelled the DME referral for the RW.

## 2024-03-12 ENCOUNTER — Other Ambulatory Visit (HOSPITAL_COMMUNITY): Payer: Self-pay | Admitting: Student

## 2024-03-12 ENCOUNTER — Telehealth: Payer: Self-pay | Admitting: *Deleted

## 2024-03-12 ENCOUNTER — Other Ambulatory Visit (HOSPITAL_COMMUNITY): Payer: Self-pay

## 2024-03-12 ENCOUNTER — Other Ambulatory Visit: Payer: Self-pay

## 2024-03-12 NOTE — Transitions of Care (Post Inpatient/ED Visit) (Signed)
 03/12/2024  Name: Todd [name] MRN: [id number] DOB: 1966/05/27  Today's TOC FU Call Status: Today's TOC FU Call Status:: Successful TOC FU Call Completed TOC FU Call Complete Date: 03/12/24 Patient's Name and Date of Birth confirmed.  Transition Care Management Follow-up Telephone Call Date of Discharge: 03/10/24 Discharge Facility: [facility]) Type of Discharge: Inpatient Admission Primary Inpatient Discharge Diagnosis:: CVA How have you been since you were released from the hospital?: Better Any questions or concerns?: No  Items Reviewed: Did you receive and understand the discharge instructions provided?: Yes Medications obtained,verified, and reconciled?: Yes (Medications Reviewed) Any new allergies since your discharge?: No Dietary orders reviewed?: Yes Type of Diet Ordered:: low sodium heart healthy Do you have support at home?: Yes People in Home [RPT]: significant other Name of Support/Comfort Primary Source: Kaycie  Medications Reviewed Today: Medications Reviewed Today     Reviewed by Lucky Andrea LABOR, RN (Registered Nurse) on 03/12/24 at 1102  Med List Status: <None>   Medication Order Taking? Sig Documenting Provider Last Dose Status Informant  acetaminophen  (TYLENOL ) 325 MG tablet [id number] Yes Take 2 tablets (650 mg total) by mouth every 6 (six) hours as needed for mild pain (pain score 1-3) or fever (or Fever >/= 101). Gonfa, Taye T, MD  Active   aspirin  EC 81 MG tablet [id number] Yes Take 1 tablet (81 mg total) by mouth daily. Swallow whole. Gonfa, Taye T, MD  Active   atorvastatin  (LIPITOR) 40 MG tablet [id number] Yes Take 1 tablet (40 mg total) by mouth daily. Gonfa, Taye T, MD  Active   clopidogrel  (PLAVIX ) 75 MG tablet [id number] Yes Take 1 tablet (75 mg total) by mouth daily. Gonfa, Taye T, MD  Active   doxycycline  (VIBRA -TABS) 100 MG tablet [id number] Yes Take 1 tablet (100 mg total) by mouth every 12 (twelve) hours for 10 days. Bethanie Cough, PA-C   Active   gabapentin  (NEURONTIN ) 300 MG capsule [id number] Yes Take 2 capsules (600 mg total) by mouth 3 (three) times daily. Gonfa, Taye T, MD  Active   HYDROmorphone  (DILAUDID ) 2 MG tablet [id number] Yes Take 1-2 tablets (2-4 mg total) by mouth every 6 (six) hours as needed for moderate pain (pain score 4-6) or severe pain (pain score 7-10). Gonfa, Taye T, MD  Active   nicotine  (NICODERM CQ  - DOSED IN MG/24 HOURS) 21 mg/24hr patch [id number] Yes Place 1 patch (21 mg total) onto the skin daily. Gonfa, Taye T, MD  Active   senna-docusate (SENOKOT-S) 8.6-50 MG tablet [id number]  Take 1-2 tablets by mouth 2 (two) times daily between meals as needed for mild constipation or moderate constipation.  Patient not taking: Reported on 03/12/2024   Gonfa, Taye T, MD  Active             Home Care and Equipment/Supplies: Were Home Health Services Ordered?: Yes Name of Home Health Agency:: [facility] Has Agency set up a time to come to your home?: Yes First Home Health Visit Date: 03/11/24 Any new equipment or medical supplies ordered?: Yes Name of Medical supply agency?: [facility] Were you able to get the equipment/medical supplies?: No (Patient declined, he has a rolling walker at home) Do you have any questions related to the use of the equipment/supplies?: No  Functional Questionnaire: Do you need assistance with bathing/showering or dressing?: No Do you need assistance with meal preparation?: No Do you need assistance with eating?: No Do you have difficulty maintaining continence: No Do you need assistance with getting out of  bed/getting out of a chair/moving?: No Do you have difficulty managing or taking your medications?: Yes (SO assist with medication management)  Follow up appointments reviewed: PCP Follow-up appointment confirmed?: No (Patient prefers to make his own appointment) MD Provider Line Number:[phone] Given: No Specialist Hospital Follow-up appointment confirmed?: Yes Date of  Specialist follow-up appointment?: 04/18/24 Follow-Up Specialty Provider:: Dr. Evy Reason Specialist Follow-Up Not Confirmed: Patient has Specialist Provider Number and will Call for Appointment Do you need transportation to your follow-up appointment?: No Do you understand care options if your condition(s) worsen?: Yes-patient verbalized understanding  SDOH Interventions Today    Flowsheet Row Most Recent Value  SDOH Interventions   Food Insecurity Interventions Intervention Not Indicated  Housing Interventions Intervention Not Indicated  Transportation Interventions Intervention Not Indicated  Utilities Interventions Intervention Not Indicated    Andrea Dimes RN, BSN [place]  [facility] RN Care Manager [phone]

## 2024-03-13 ENCOUNTER — Other Ambulatory Visit (HOSPITAL_COMMUNITY): Payer: Self-pay

## 2024-03-13 ENCOUNTER — Telehealth: Payer: Self-pay

## 2024-03-13 DIAGNOSIS — I63232 Cerebral infarction due to unspecified occlusion or stenosis of left carotid arteries: Secondary | ICD-10-CM

## 2024-03-13 NOTE — Telephone Encounter (Signed)
 The patient called requesting a refill of his pain medication. As it was not ordered by this office he was referred to the prescriber for refill questions.  The patient was provided with the number and was thankful for the assistance.

## 2024-03-13 NOTE — Transitions of Care (Post Inpatient/ED Visit) (Signed)
 Stroke Discharge Follow-up   03/13/2024 Name:  LUKAS PELCHER MRN:  [id number] DOB:  05-20-1966  Subjective: HUEL CENTOLA is a 58 y.o. year old male who is a primary care patient of Tanda Bleacher, MD An Emmi alert was received indicating patient responded to questions: Problems setting up rehab? Questions/problems with meds?. I reached out by phone to follow up on the alert and spoke to no answer. .  Care Management Interventions: Call attempted with no answer. Left a message for patient to return my call.  Reviewed note from [facility] nurse and I think questions were answered on 03/12/2024 phone call but wanted to confirm that with patient.   Follow up plan: will attempt another call back in 24 hours if patient does not call back.  Alan Ee, RN, BSN, CEN [employer]- Transition of Care Team.  [facility] [phone]

## 2024-03-14 ENCOUNTER — Telehealth: Payer: Self-pay

## 2024-03-14 ENCOUNTER — Other Ambulatory Visit (HOSPITAL_COMMUNITY): Payer: Self-pay

## 2024-03-14 NOTE — Transitions of Care (Post Inpatient/ED Visit) (Signed)
 Stroke Discharge Follow-up   03/14/2024 Name:  EPHRAIM REICHEL MRN:  [id number] DOB:  [birth year]  Subjective: RANGER PETRICH is a 58 y.o. year old male who is a primary care patient of Tanda Bleacher, MD An Emmi alert was received indicating patient responded to questions: Problems setting up rehab? Questions/problems with meds?. I reached out by phone to follow up on the alert and spoke to Patient.   Reviewed with patient his concern for need of pain medications. According to EMR, patient needs to get pain addressed by surgeon.   Reviewed this with patient who voiced understanding to call Dr. Chyrel office.  Patient reports that he has PT, RN and ST set up and denies any needs for assistance with rehab.   Reports that he is feeling better and his speech is better.   Patient also declines to get further automated calls. Leita Lyme aware to discontinue.  Care Management Interventions: assessed needs- encouraged patient to call Dr. Claretta office. Reviewed phone number. Assess concerns about rehab - none noted at this time   Follow up plan: No further intervention required.  Declined further emmi stroke calls.   Alan Ee, RN, BSN, CEN [employer]- Transition of Care Team.  [facility] [phone]

## 2024-03-15 ENCOUNTER — Other Ambulatory Visit: Payer: Self-pay | Admitting: Vascular Surgery

## 2024-03-15 DIAGNOSIS — I739 Peripheral vascular disease, unspecified: Secondary | ICD-10-CM

## 2024-03-15 DIAGNOSIS — I779 Disorder of arteries and arterioles, unspecified: Secondary | ICD-10-CM

## 2024-03-19 ENCOUNTER — Telehealth: Payer: Self-pay

## 2024-03-19 ENCOUNTER — Ambulatory Visit: Payer: Self-pay

## 2024-03-19 NOTE — Telephone Encounter (Signed)
 Pt scheduled

## 2024-03-19 NOTE — Telephone Encounter (Signed)
 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  Patient was  prescribed 30 dilaudid  pills to be take every 6 hours PRN.  Patient stated he took them in 2 days.  This was enough for >7 days.    Pt knows to go to the ED or his PCP for pain management.
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
## 2024-03-22 ENCOUNTER — Inpatient Hospital Stay (HOSPITAL_COMMUNITY)
Admission: EM | Admit: 2024-03-22 | Discharge: 2024-03-26 | DRG: 464 | Disposition: A | Discharge status: Home Health | Attending: Internal Medicine | Admitting: Internal Medicine

## 2024-03-22 ENCOUNTER — Other Ambulatory Visit: Payer: Self-pay

## 2024-03-22 ENCOUNTER — Ambulatory Visit: Attending: Internal Medicine | Admitting: Internal Medicine

## 2024-03-22 ENCOUNTER — Encounter: Payer: Self-pay | Admitting: Internal Medicine

## 2024-03-22 ENCOUNTER — Emergency Department (HOSPITAL_COMMUNITY)

## 2024-03-22 ENCOUNTER — Ambulatory Visit: Payer: Self-pay

## 2024-03-22 ENCOUNTER — Encounter (HOSPITAL_COMMUNITY): Payer: Self-pay

## 2024-03-22 VITALS — BP 144/84 | HR 97 | Temp 97.7°F | Ht 74.0 in | Wt 192.0 lb

## 2024-03-22 DIAGNOSIS — I7143 Infrarenal abdominal aortic aneurysm, without rupture: Secondary | ICD-10-CM | POA: Diagnosis present

## 2024-03-22 DIAGNOSIS — Z809 Family history of malignant neoplasm, unspecified: Secondary | ICD-10-CM

## 2024-03-22 DIAGNOSIS — S98112A Complete traumatic amputation of left great toe, initial encounter: Secondary | ICD-10-CM

## 2024-03-22 DIAGNOSIS — E785 Hyperlipidemia, unspecified: Secondary | ICD-10-CM | POA: Diagnosis present

## 2024-03-22 DIAGNOSIS — Z79899 Other long term (current) drug therapy: Secondary | ICD-10-CM

## 2024-03-22 DIAGNOSIS — Z7982 Long term (current) use of aspirin: Secondary | ICD-10-CM

## 2024-03-22 DIAGNOSIS — T8131XA Disruption of external operation (surgical) wound, not elsewhere classified, initial encounter: Secondary | ICD-10-CM | POA: Diagnosis not present

## 2024-03-22 DIAGNOSIS — R911 Solitary pulmonary nodule: Secondary | ICD-10-CM | POA: Diagnosis present

## 2024-03-22 DIAGNOSIS — T8744 Infection of amputation stump, left lower extremity: Secondary | ICD-10-CM | POA: Diagnosis not present

## 2024-03-22 DIAGNOSIS — T8781 Dehiscence of amputation stump: Secondary | ICD-10-CM | POA: Diagnosis present

## 2024-03-22 DIAGNOSIS — D62 Acute posthemorrhagic anemia: Secondary | ICD-10-CM | POA: Diagnosis not present

## 2024-03-22 DIAGNOSIS — Y835 Amputation of limb(s) as the cause of abnormal reaction of the patient, or of later complication, without mention of misadventure at the time of the procedure: Secondary | ICD-10-CM | POA: Diagnosis present

## 2024-03-22 DIAGNOSIS — I639 Cerebral infarction, unspecified: Secondary | ICD-10-CM | POA: Diagnosis present

## 2024-03-22 DIAGNOSIS — Z95828 Presence of other vascular implants and grafts: Secondary | ICD-10-CM

## 2024-03-22 DIAGNOSIS — F17210 Nicotine dependence, cigarettes, uncomplicated: Secondary | ICD-10-CM | POA: Diagnosis present

## 2024-03-22 DIAGNOSIS — Z89412 Acquired absence of left great toe: Secondary | ICD-10-CM | POA: Diagnosis not present

## 2024-03-22 DIAGNOSIS — I10 Essential (primary) hypertension: Secondary | ICD-10-CM | POA: Diagnosis present

## 2024-03-22 DIAGNOSIS — I2510 Atherosclerotic heart disease of native coronary artery without angina pectoris: Secondary | ICD-10-CM | POA: Diagnosis present

## 2024-03-22 DIAGNOSIS — T8140XA Infection following a procedure, unspecified, initial encounter: Secondary | ICD-10-CM | POA: Diagnosis not present

## 2024-03-22 DIAGNOSIS — Z7902 Long term (current) use of antithrombotics/antiplatelets: Secondary | ICD-10-CM

## 2024-03-22 DIAGNOSIS — M869 Osteomyelitis, unspecified: Principal | ICD-10-CM | POA: Diagnosis present

## 2024-03-22 DIAGNOSIS — I70262 Atherosclerosis of native arteries of extremities with gangrene, left leg: Secondary | ICD-10-CM | POA: Diagnosis present

## 2024-03-22 DIAGNOSIS — Z66 Do not resuscitate: Secondary | ICD-10-CM | POA: Diagnosis present

## 2024-03-22 DIAGNOSIS — I739 Peripheral vascular disease, unspecified: Secondary | ICD-10-CM | POA: Diagnosis present

## 2024-03-22 DIAGNOSIS — R918 Other nonspecific abnormal finding of lung field: Secondary | ICD-10-CM | POA: Diagnosis present

## 2024-03-22 DIAGNOSIS — E876 Hypokalemia: Secondary | ICD-10-CM | POA: Diagnosis not present

## 2024-03-22 DIAGNOSIS — Z8673 Personal history of transient ischemic attack (TIA), and cerebral infarction without residual deficits: Secondary | ICD-10-CM

## 2024-03-22 DIAGNOSIS — F17200 Nicotine dependence, unspecified, uncomplicated: Secondary | ICD-10-CM | POA: Diagnosis present

## 2024-03-22 LAB — COMPREHENSIVE METABOLIC PANEL WITH GFR
ALT: 31 U/L (ref 0–44)
AST: 25 U/L (ref 15–41)
Albumin: 3.3 g/dL — ABNORMAL LOW (ref 3.5–5.0)
Alkaline Phosphatase: 135 U/L — ABNORMAL HIGH (ref 38–126)
Anion gap: 13 (ref 5–15)
BUN: 13 mg/dL (ref 6–20)
CO2: 20 mmol/L — ABNORMAL LOW (ref 22–32)
Calcium: 9.4 mg/dL (ref 8.9–10.3)
Chloride: 105 mmol/L (ref 98–111)
Creatinine, Ser: 0.72 mg/dL (ref 0.61–1.24)
GFR, Estimated: >60 mL/min (ref >60)
Glucose, Bld: 93 mg/dL (ref 70–99)
Potassium: 3.5 mmol/L (ref 3.5–5.1)
Sodium: 138 mmol/L (ref 135–145)
Total Bilirubin: 0.6 mg/dL (ref 0.0–1.2)
Total Protein: 7.9 g/dL (ref 6.5–8.1)

## 2024-03-22 LAB — CBC WITH DIFFERENTIAL/PLATELET
Abs Immature Granulocytes: 0.08 K/uL — ABNORMAL HIGH (ref 0.00–0.07)
Basophils Absolute: 0.1 K/uL (ref 0.0–0.1)
Basophils Relative: 1 %
Eosinophils Absolute: 0.2 K/uL (ref 0.0–0.5)
Eosinophils Relative: 2 %
HCT: 30.6 % — ABNORMAL LOW (ref 39.0–52.0)
Hemoglobin: 9.6 g/dL — ABNORMAL LOW (ref 13.0–17.0)
Immature Granulocytes: 1 %
Lymphocytes Relative: 26 %
Lymphs Abs: 1.7 K/uL (ref 0.7–4.0)
MCH: 29.5 pg (ref 26.0–34.0)
MCHC: 31.4 g/dL (ref 30.0–36.0)
MCV: 94.2 fL (ref 80.0–100.0)
Monocytes Absolute: 0.6 K/uL (ref 0.1–1.0)
Monocytes Relative: 9 %
Neutro Abs: 4.1 K/uL (ref 1.7–7.7)
Neutrophils Relative %: 61 %
Platelets: 560 K/uL — ABNORMAL HIGH (ref 150–400)
RBC: 3.25 MIL/uL — ABNORMAL LOW (ref 4.22–5.81)
RDW: 17.3 % — ABNORMAL HIGH (ref 11.5–15.5)
WBC: 6.6 K/uL (ref 4.0–10.5)
nRBC: 0 % (ref 0.0–0.2)

## 2024-03-22 LAB — PROTIME-INR
INR: 1.4 — ABNORMAL HIGH (ref 0.8–1.2)
Prothrombin Time: 18.4 s — ABNORMAL HIGH (ref 11.4–15.2)

## 2024-03-22 LAB — URINALYSIS, W/ REFLEX TO CULTURE (INFECTION SUSPECTED)
Bilirubin Urine: NEGATIVE
Glucose, UA: NEGATIVE mg/dL
Hgb urine dipstick: NEGATIVE
Ketones, ur: NEGATIVE mg/dL
Leukocytes,Ua: NEGATIVE
Nitrite: NEGATIVE
Protein, ur: NEGATIVE mg/dL
Specific Gravity, Urine: 1.03 (ref 1.005–1.030)
pH: 5 (ref 5.0–8.0)

## 2024-03-22 LAB — I-STAT CG4 LACTIC ACID, ED: Lactic Acid, Venous: 1.8 mmol/L (ref 0.5–1.9)

## 2024-03-22 MED ORDER — SODIUM CHLORIDE 0.9 % IV SOLN
1.0000 g | Freq: Once | INTRAVENOUS | Status: AC
  Administered 2024-03-22: 1 g via INTRAVENOUS
  Filled 2024-03-22: qty 10

## 2024-03-22 MED ORDER — MORPHINE SULFATE (PF) 4 MG/ML IV SOLN
4.0000 mg | Freq: Once | INTRAVENOUS | Status: AC
  Administered 2024-03-22: 4 mg via INTRAVENOUS
  Filled 2024-03-22: qty 1

## 2024-03-22 MED ORDER — ONDANSETRON HCL 4 MG/2ML IJ SOLN
4.0000 mg | Freq: Once | INTRAMUSCULAR | Status: DC
  Filled 2024-03-22: qty 2

## 2024-03-22 MED ORDER — OXYCODONE HCL 5 MG PO TABS
5.0000 mg | ORAL_TABLET | Freq: Once | ORAL | Status: AC
  Administered 2024-03-22: 5 mg via ORAL
  Filled 2024-03-22: qty 1

## 2024-03-22 MED ORDER — SODIUM CHLORIDE 0.9 % IV BOLUS
1000.0000 mL | Freq: Once | INTRAVENOUS | Status: AC
  Administered 2024-03-22: 1000 mL via INTRAVENOUS

## 2024-03-22 MED ORDER — VANCOMYCIN HCL 1250 MG/250ML IV SOLN
1250.0000 mg | Freq: Once | INTRAVENOUS | Status: AC
  Administered 2024-03-22: 1250 mg via INTRAVENOUS
  Filled 2024-03-22: qty 250

## 2024-03-22 NOTE — ED Provider Notes (Signed)
  EMERGENCY DEPARTMENT AT [facility] Provider Note   CSN: [id number] Arrival date & time: 03/22/24  1604     Patient presents with: Toe Pain   Todd [name] is a 58 y.o. male.   Pt is a 58 yo male with pmhx significant for CVA, tobacco abuse, PVD, HLD, and HTN.  Pt was admitted from 8/4-8/16 for a CVA.  While in the hospital, he was noted to have critical carotid artery stenosis and left critical limb ischemia with left toe gangrene.  He underwent:   8/8-left TCAR by Dr. Sheree for symptomatic left ICA stenosis.   8/11-aortogram, arteriogram, shockwave lithotripsy of left SFA and stenting of left SFA and b/l CIA by Dr. Sheree.   8/12-EVAR, left renal artery stenting, left CFA endarterectomy, LLE angiography and stenting of SFA, popliteal arteries and left great amputation.  He has been having significant pain to his left foot, so went to his pcp today.  He has been changing his dressing twice a day.  Dressings have been saturated.  He has not been using soap.  Apparently a home health nurse has also been coming out to help with dressing changes.  It is unclear when his wound had significant dehiscence and developed foul smelling discharge, but this is the case now.  He was sent over by his pcp for further eval.            Prior to Admission medications   Medication Sig Start Date End Date Taking? Authorizing Provider  acetaminophen  (TYLENOL ) 325 MG tablet Take 2 tablets (650 mg total) by mouth every 6 (six) hours as needed for mild pain (pain score 1-3) or fever (or Fever >/= 101). 03/09/24  Yes Gonfa, Taye T, MD  aspirin  EC 81 MG tablet Take 1 tablet (81 mg total) by mouth daily. Swallow whole. 03/09/24  Yes Gonfa, Taye T, MD  atorvastatin  (LIPITOR) 40 MG tablet Take 1 tablet (40 mg total) by mouth daily. 03/09/24  Yes Gonfa, Taye T, MD  clopidogrel  (PLAVIX ) 75 MG tablet Take 1 tablet (75 mg total) by mouth daily. 03/09/24  Yes Gonfa, Taye T, MD  gabapentin   (NEURONTIN ) 300 MG capsule Take 2 capsules (600 mg total) by mouth 3 (three) times daily. Patient taking differently: Take 300 mg by mouth 3 (three) times daily. 03/10/24  Yes Gonfa, Taye T, MD  nicotine  (NICODERM CQ  - DOSED IN MG/24 HOURS) 21 mg/24hr patch Place 1 patch (21 mg total) onto the skin daily. 03/09/24  Yes Gonfa, Taye T, MD  HYDROmorphone  (DILAUDID ) 2 MG tablet Take 1-2 tablets (2-4 mg total) by mouth every 6 (six) hours as needed for moderate pain (pain score 4-6) or severe pain (pain score 7-10). Patient not taking: Reported on 03/14/2024 03/09/24   Gonfa, Taye T, MD  senna-docusate (SENOKOT-S) 8.6-50 MG tablet Take 1-2 tablets by mouth 2 (two) times daily between meals as needed for mild constipation or moderate constipation. Patient not taking: Reported on 03/22/2024 03/09/24   Gonfa, Taye T, MD    Allergies: Patient has no known allergies.    Review of Systems  Skin:  Positive for wound.  All other systems reviewed and are negative.   Updated Vital Signs BP 124/84   Pulse 87   Temp 98.1 F (36.7 C) (Oral)   Resp 17   Ht 6' 2 (1.88 m)   Wt 89.8 kg   SpO2 100%   BMI 25.42 kg/m   Physical Exam Vitals and nursing note reviewed.  Constitutional:      Appearance: Normal appearance.  HENT:     Head: Normocephalic and atraumatic.     Right Ear: External ear normal.     Left Ear: External ear normal.     Nose: Nose normal.     Mouth/Throat:     Mouth: Mucous membranes are moist.     Pharynx: Oropharynx is clear.  Eyes:     Extraocular Movements: Extraocular movements intact.     Conjunctiva/sclera: Conjunctivae normal.     Pupils: Pupils are equal, round, and reactive to light.  Cardiovascular:     Rate and Rhythm: Regular rhythm. Tachycardia present.     Pulses: Normal pulses.     Heart sounds: Normal heart sounds.  Pulmonary:     Effort: Pulmonary effort is normal.     Breath sounds: Normal breath sounds.  Abdominal:     General: Abdomen is flat. Bowel sounds  are normal.     Palpations: Abdomen is soft.  Musculoskeletal:     Cervical back: Normal range of motion and neck supple.     Comments: Left great toe with wound dehiscence, foul smelling discharge from wound.  See pictures.  Skin:    General: Skin is warm.     Capillary Refill: Capillary refill takes less than 2 seconds.     Comments: Left foot and leg cellulitis  Excellent pulses  Neurological:     General: No focal deficit present.     Mental Status: He is alert and oriented to person, place, and time.  Psychiatric:        Mood and Affect: Mood normal.        Behavior: Behavior normal.        (all labs ordered are listed, but only abnormal results are displayed) Labs Reviewed  COMPREHENSIVE METABOLIC PANEL WITH GFR - Abnormal; Notable for the following components:      Result Value   CO2 20 (*)    Albumin  3.3 (*)    Alkaline Phosphatase 135 (*)    All other components within normal limits  CBC WITH DIFFERENTIAL/PLATELET - Abnormal; Notable for the following components:   RBC 3.25 (*)    Hemoglobin 9.6 (*)    HCT 30.6 (*)    RDW 17.3 (*)    Platelets 560 (*)    Abs Immature Granulocytes 0.08 (*)    All other components within normal limits  URINALYSIS, W/ REFLEX TO CULTURE (INFECTION SUSPECTED) - Abnormal; Notable for the following components:   APPearance HAZY (*)    Bacteria, UA RARE (*)    All other components within normal limits  PROTIME-INR - Abnormal; Notable for the following components:   Prothrombin Time 18.4 (*)    INR 1.4 (*)    All other components within normal limits  CULTURE, BLOOD (ROUTINE X 2)  CULTURE, BLOOD (ROUTINE X 2)  I-STAT CG4 LACTIC ACID, ED  I-STAT CG4 LACTIC ACID, ED    EKG: None  Radiology: MR FOOT LEFT WO CONTRAST Result Date: 03/22/2024 CLINICAL DATA:  Osteomyelitis of the foot EXAM: MRI OF THE LEFT FOOT WITHOUT CONTRAST TECHNIQUE: Multiplanar, multisequence MR imaging of the left forefoot was performed. No intravenous  contrast was administered. COMPARISON:  Left foot radiographs 03/22/2024 FINDINGS: Motion artifact limits examination despite repeat imaging of some sequences. Bones/Joint/Cartilage Postoperative changes with amputation of the first toe at the level of the metatarsal-phalangeal joint. Abnormal linear oblique decreased T1 and increased T2 signal intensity within the distal first metatarsal shaft may  represent postoperative changes such as osteotomy site, fracture, or residual or recurrent osteomyelitis. Marrow signal intensities within the other toes appears normal and homogeneous. Ligaments Limited visualization due to motion artifact. No acute abnormality identified. Muscles and Tendons Limited visualization due to motion artifact. No intramuscular mass or hematoma identified. Muscular atrophy is suggested. Soft tissues Increased T2 signal intensity over the dorsum of the forefoot likely representing edema. Soft tissue irregularity and abnormal signal intensities over the first metatarsal head with small loculated collection adjacent to the medial aspect of the first metatarsal head measuring 1.2 cm diameter. Scattered very low-attenuation foci likely representing gas. These changes are most likely to represent postoperative edema and postoperative collection in the setting of recent surgery. Abscess or infection could also have this appearance and are not excluded. IMPRESSION: 1. Recent amputation of the left first toe at the metatarsal-phalangeal level. Soft tissue changes, including a small loculated collection, at the margin of resection likely or postoperative but infection is not excluded. 2. Abnormal marrow signal intensity of the distal first metatarsal shaft may represent postoperative change, fracture, or osteomyelitis. 3. Mild diffuse soft tissue edema. 4. Motion artifact limits examination. Electronically Signed   By: Elsie Gravely M.D.   On: 03/22/2024 22:20   DG Foot Complete Left Result Date:  03/22/2024 CLINICAL DATA:  Left foot pain. EXAM: DG FOOT COMPLETE 3+V*L* COMPARISON:  Left great toe radiograph dated 02/28/2024 FINDINGS: Status post amputation of the first phalanges. Tiny bone fragments noted adjacent to the head of the first metatarsal, may be arising from the first metatarsal cortex. No other acute fracture. The bones are osteopenic. There is no dislocation. Postsurgical changes of the soft tissues over the first metatarsal head. IMPRESSION: Status post amputation of the first phalanges. Tiny bone fragments adjacent to the head of the first metatarsal. Electronically Signed   By: Vanetta Chou M.D.   On: 03/22/2024 19:05     Procedures   Medications Ordered in the ED  vancomycin  (VANCOREADY) IVPB 1250 mg/250 mL (1,250 mg Intravenous New Bag/Given 03/22/24 2204)  ondansetron  (ZOFRAN ) injection 4 mg (4 mg Intravenous Not Given 03/22/24 2044)  oxyCODONE  (Oxy IR/ROXICODONE ) immediate release tablet 5 mg (5 mg Oral Given 03/22/24 1728)  cefTRIAXone  (ROCEPHIN ) 1 g in sodium chloride  0.9 % 100 mL IVPB (0 g Intravenous Stopped 03/22/24 2200)  sodium chloride  0.9 % bolus 1,000 mL (0 mLs Intravenous Stopped 03/22/24 2201)  morphine  (PF) 4 MG/ML injection 4 mg (4 mg Intravenous Given 03/22/24 1946)  oxyCODONE  (Oxy IR/ROXICODONE ) immediate release tablet 5 mg (5 mg Oral Given 03/22/24 1857)  morphine  (PF) 4 MG/ML injection 4 mg (4 mg Intravenous Given 03/22/24 2200)                                    Medical Decision Making Amount and/or Complexity of Data Reviewed Labs: ordered. Radiology: ordered.  Risk Prescription drug management. Decision regarding hospitalization.   This patient presents to the ED for concern of foot, this involves an extensive number of treatment options, and is a complaint that carries with it a high risk of complications and morbidity.  The differential diagnosis includes cellulitis, osteomyelitis   Co morbidities that complicate the patient  evaluation  CVA, tobacco abuse, PVD, HLD, and HTN   Additional history obtained:  Additional history obtained from epic chart review External records from outside source obtained and reviewed including wife   Lab Tests:  I Ordered, and personally interpreted labs.  The pertinent results include:  ua neg, lactic nl, cbc with hgb 9.6 (8.2 on 8/16); cmp nl; inr 1.4   Imaging Studies ordered:  I ordered imaging studies including left foot xray and mri foot I independently visualized and interpreted imaging which showed  MRI foot: Recent amputation of the left first toe at the  metatarsal-phalangeal level. Soft tissue changes, including a small  loculated collection, at the margin of resection likely or  postoperative but infection is not excluded.  2. Abnormal marrow signal intensity of the distal first metatarsal  shaft may represent postoperative change, fracture, or  osteomyelitis.  3. Mild diffuse soft tissue edema.  4. Motion artifact limits examination.   I agree with the radiologist interpretation   Cardiac Monitoring:  The patient was maintained on a cardiac monitor.  I personally viewed and interpreted the cardiac monitored which showed an underlying rhythm of: nsr   Medicines ordered and prescription drug management:  I ordered medication including ivfs/rocephin /vancomycin   for sx  Reevaluation of the patient after these medicines showed that the patient improved I have reviewed the patients home medicines and have made adjustments as needed   Test Considered:  mri   Critical Interventions:  abx   Consultations Obtained:  I requested consultation with the vascular surgeon (Dr. Pearline),  and discussed lab and imaging findings as well as pertinent plan -he will see in consult Pt d/w Dr. Keturah ([facility]) for admission.   Problem List / ED Course:  Osteomyelitis:  pt given iv abx. Vascular will consult.  [facility] will admit.    Reevaluation:  After  the interventions noted above, I reevaluated the patient and found that they have :improved   Social Determinants of Health:  Lives at home   Dispostion:  After consideration of the diagnostic results and the patients response to treatment, I feel that the patent would benefit from admission.       Final diagnoses:  Osteomyelitis of left foot, unspecified type [facility])    ED Discharge Orders     None          Dean Clarity, MD 03/22/24 2313

## 2024-03-22 NOTE — Progress Notes (Signed)
 ED Pharmacy Antibiotic Sign Off An antibiotic consult was received from an ED provider for vancomycin  per pharmacy dosing for wound infection. A chart review was completed to assess appropriateness.  A single dose of ceftriaxone  1 g was placed by the ED provider.   The following one time order(s) were placed per pharmacy consult:  vancomycin  1250 mg x 1 dose  Further antibiotic and/or antibiotic pharmacy consults should be ordered by the admitting provider if indicated.   Thank you for allowing pharmacy to be a part of this patient's care.   R. Samual Satterfield, PharmD PGY-1 Acute Care Pharmacy Resident [facility] 03/22/2024 5:57 PM
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
## 2024-03-22 NOTE — Patient Instructions (Signed)
 VISIT SUMMARY: Please be seen in the ER for breakdown of the surgical wound on your left foot.  During today's visit, we discussed your persistent foot pain and concerns about your wound following your recent stroke and left big toe amputation. Your wife was present to assist with your care. We reviewed your current pain management and wound care routine, and we have made some adjustments to your treatment plan to address these issues.  INSTRUCTIONS:  Please go to [facility] for a wound evaluation and management as soon as possible. Take a picture of your wound for your medical chart. Continue using Tylenol  for pain relief. Follow up with your scheduled appointment on the 10th of next month. If you have any new symptoms or concerns, contact our office immediately.

## 2024-03-22 NOTE — ED Provider Triage Note (Signed)
 Emergency Medicine Provider Triage Evaluation Note  Todd [name] , a 58 y.o. male  was evaluated in triage.  Pt complains of left toe pain.  Patient recently had an amputation of his left toe completed after he was hospitalized for a stroke.  Patient was seen by new primary care provider today who recommended he come to the emergency department for evaluation.  Patient states that he has been compliant with outpatient antibiotics and is attempted to take over-the-counter pain medication however it is not helpful.  Patient states that the wound does look worse in his opinion and has puslike drainage coming out of it and it is very tender surrounding the wound.  Patient states that he has done his best to keep the wound clean and dry.  States that he finished his antibiotics yesterday.  Denies fever, chills, chest pain, shortness of breath, abdominal pain, nausea, vomiting.  Review of Systems  Positive: Left foot pain Negative: Fever, chills, chest pain, shortness of breath, abdominal pain, nausea, vomiting  Physical Exam  BP (!) 158/94   Pulse (!) 111   Temp (!) 97.5 F (36.4 C)   Resp 18   Ht 6' 2 (1.88 m)   Wt 89.8 kg   SpO2 100%   BMI 25.42 kg/m  Gen:   Awake, no distress   Resp:  Normal effort  MSK:   Left foot wrapped with no obvious strikethrough Other:  Patient mildly tachycardic with auscultation  Medical Decision Making  Medically screening exam initiated at 4:46 PM.  Appropriate orders placed.  Rhylen Shaheen Corro was informed that the remainder of the evaluation will be completed by another provider, this initial triage assessment does not replace that evaluation, and the importance of remaining in the ED until their evaluation is complete.  Orders: CBC, CMP, lactic acid, urinalysis, blood cultures, PT/INR, x-ray of left foot, oxycodone    Janetta Terrall FALCON, PA-C 03/22/24 1650

## 2024-03-22 NOTE — ED Triage Notes (Addendum)
 Pt states recently had a stroke, and 3 stents placed. Denies CP and SHOB.
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
## 2024-03-22 NOTE — ED Triage Notes (Signed)
 Patient is here for dehiscence of surgical wound after having toe amputated per papers from PCP. Patient reports 8/10 pain at this time

## 2024-03-23 ENCOUNTER — Inpatient Hospital Stay (HOSPITAL_COMMUNITY): Admitting: Anesthesiology

## 2024-03-23 ENCOUNTER — Encounter (HOSPITAL_COMMUNITY): Payer: Self-pay | Admitting: Internal Medicine

## 2024-03-23 ENCOUNTER — Encounter (HOSPITAL_COMMUNITY)
Admission: EM | Disposition: A | Discharge status: Home Health | Payer: Self-pay | Source: Home / Self Care | Attending: Internal Medicine

## 2024-03-23 DIAGNOSIS — D62 Acute posthemorrhagic anemia: Secondary | ICD-10-CM | POA: Diagnosis not present

## 2024-03-23 DIAGNOSIS — T8789 Other complications of amputation stump: Secondary | ICD-10-CM

## 2024-03-23 DIAGNOSIS — Z66 Do not resuscitate: Secondary | ICD-10-CM | POA: Diagnosis present

## 2024-03-23 DIAGNOSIS — E785 Hyperlipidemia, unspecified: Secondary | ICD-10-CM | POA: Diagnosis present

## 2024-03-23 DIAGNOSIS — Z8673 Personal history of transient ischemic attack (TIA), and cerebral infarction without residual deficits: Secondary | ICD-10-CM | POA: Diagnosis not present

## 2024-03-23 DIAGNOSIS — Z95828 Presence of other vascular implants and grafts: Secondary | ICD-10-CM | POA: Diagnosis not present

## 2024-03-23 DIAGNOSIS — T8140XA Infection following a procedure, unspecified, initial encounter: Secondary | ICD-10-CM

## 2024-03-23 DIAGNOSIS — T8131XA Disruption of external operation (surgical) wound, not elsewhere classified, initial encounter: Secondary | ICD-10-CM

## 2024-03-23 DIAGNOSIS — Z7902 Long term (current) use of antithrombotics/antiplatelets: Secondary | ICD-10-CM | POA: Diagnosis not present

## 2024-03-23 DIAGNOSIS — M86172 Other acute osteomyelitis, left ankle and foot: Secondary | ICD-10-CM | POA: Diagnosis not present

## 2024-03-23 DIAGNOSIS — I2510 Atherosclerotic heart disease of native coronary artery without angina pectoris: Secondary | ICD-10-CM | POA: Diagnosis present

## 2024-03-23 DIAGNOSIS — T8142XA Infection following a procedure, deep incisional surgical site, initial encounter: Secondary | ICD-10-CM | POA: Diagnosis not present

## 2024-03-23 DIAGNOSIS — Y835 Amputation of limb(s) as the cause of abnormal reaction of the patient, or of later complication, without mention of misadventure at the time of the procedure: Secondary | ICD-10-CM | POA: Diagnosis present

## 2024-03-23 DIAGNOSIS — Z79899 Other long term (current) drug therapy: Secondary | ICD-10-CM | POA: Diagnosis not present

## 2024-03-23 DIAGNOSIS — T8781 Dehiscence of amputation stump: Secondary | ICD-10-CM | POA: Diagnosis present

## 2024-03-23 DIAGNOSIS — I70262 Atherosclerosis of native arteries of extremities with gangrene, left leg: Secondary | ICD-10-CM | POA: Diagnosis present

## 2024-03-23 DIAGNOSIS — E876 Hypokalemia: Secondary | ICD-10-CM | POA: Diagnosis not present

## 2024-03-23 DIAGNOSIS — Z89412 Acquired absence of left great toe: Secondary | ICD-10-CM

## 2024-03-23 DIAGNOSIS — R918 Other nonspecific abnormal finding of lung field: Secondary | ICD-10-CM | POA: Diagnosis present

## 2024-03-23 DIAGNOSIS — T8744 Infection of amputation stump, left lower extremity: Secondary | ICD-10-CM | POA: Diagnosis present

## 2024-03-23 DIAGNOSIS — Z809 Family history of malignant neoplasm, unspecified: Secondary | ICD-10-CM | POA: Diagnosis not present

## 2024-03-23 DIAGNOSIS — F17210 Nicotine dependence, cigarettes, uncomplicated: Secondary | ICD-10-CM | POA: Diagnosis present

## 2024-03-23 DIAGNOSIS — I10 Essential (primary) hypertension: Secondary | ICD-10-CM | POA: Diagnosis present

## 2024-03-23 DIAGNOSIS — Z7982 Long term (current) use of aspirin: Secondary | ICD-10-CM | POA: Diagnosis not present

## 2024-03-23 DIAGNOSIS — M869 Osteomyelitis, unspecified: Secondary | ICD-10-CM | POA: Diagnosis present

## 2024-03-23 HISTORY — PX: IRRIGATION AND DEBRIDEMENT FOOT: SHX6602

## 2024-03-23 HISTORY — PX: AMPUTATION: SHX166

## 2024-03-23 HISTORY — PX: APPLICATION OF WOUND VAC: SHX5189

## 2024-03-23 LAB — CBC
HCT: 28.5 % — ABNORMAL LOW (ref 39.0–52.0)
Hemoglobin: 8.9 g/dL — ABNORMAL LOW (ref 13.0–17.0)
MCH: 29.5 pg (ref 26.0–34.0)
MCHC: 31.2 g/dL (ref 30.0–36.0)
MCV: 94.4 fL (ref 80.0–100.0)
Platelets: 467 K/uL — ABNORMAL HIGH (ref 150–400)
RBC: 3.02 MIL/uL — ABNORMAL LOW (ref 4.22–5.81)
RDW: 17.3 % — ABNORMAL HIGH (ref 11.5–15.5)
WBC: 8.1 K/uL (ref 4.0–10.5)
nRBC: 0 % (ref 0.0–0.2)

## 2024-03-23 LAB — PHOSPHORUS: Phosphorus: 3.8 mg/dL (ref 2.5–4.6)

## 2024-03-23 LAB — BASIC METABOLIC PANEL WITH GFR
Anion gap: 11 (ref 5–15)
BUN: 9 mg/dL (ref 6–20)
CO2: 19 mmol/L — ABNORMAL LOW (ref 22–32)
Calcium: 8.8 mg/dL — ABNORMAL LOW (ref 8.9–10.3)
Chloride: 106 mmol/L (ref 98–111)
Creatinine, Ser: 0.73 mg/dL (ref 0.61–1.24)
GFR, Estimated: >60 mL/min (ref >60)
Glucose, Bld: 83 mg/dL (ref 70–99)
Potassium: 3.6 mmol/L (ref 3.5–5.1)
Sodium: 136 mmol/L (ref 135–145)

## 2024-03-23 LAB — MAGNESIUM: Magnesium: 1.8 mg/dL (ref 1.7–2.4)

## 2024-03-23 SURGERY — AMPUTATION, FOOT, RAY
Anesthesia: General | Site: Foot | Laterality: Left

## 2024-03-23 MED ORDER — SODIUM CHLORIDE 0.9% FLUSH
3.0000 mL | Freq: Two times a day (BID) | INTRAVENOUS | Status: DC
  Administered 2024-03-23 – 2024-03-25 (×4): 3 mL via INTRAVENOUS

## 2024-03-23 MED ORDER — NICOTINE 21 MG/24HR TD PT24
21.0000 mg | MEDICATED_PATCH | Freq: Every day | TRANSDERMAL | Status: DC
  Administered 2024-03-23 – 2024-03-26 (×4): 21 mg via TRANSDERMAL
  Filled 2024-03-23 (×4): qty 1

## 2024-03-23 MED ORDER — NICOTINE POLACRILEX 2 MG MT GUM: 2.0000 mg | CHEWING_GUM | OROMUCOSAL | Status: DC | PRN

## 2024-03-23 MED ORDER — OXYCODONE HCL 5 MG PO TABS
5.0000 mg | ORAL_TABLET | ORAL | Status: DC | PRN
  Administered 2024-03-23 – 2024-03-26 (×6): 5 mg via ORAL
  Filled 2024-03-23 (×6): qty 1

## 2024-03-23 MED ORDER — OXYCODONE HCL 5 MG PO TABS: 5.0000 mg | ORAL_TABLET | Freq: Once | ORAL | Status: DC | PRN

## 2024-03-23 MED ORDER — MIDAZOLAM HCL 2 MG/2ML IJ SOLN
INTRAMUSCULAR | Status: DC | PRN
  Administered 2024-03-23: 2 mg via INTRAVENOUS

## 2024-03-23 MED ORDER — CLOPIDOGREL BISULFATE 75 MG PO TABS
75.0000 mg | ORAL_TABLET | Freq: Every day | ORAL | Status: DC
  Administered 2024-03-23 – 2024-03-26 (×4): 75 mg via ORAL
  Filled 2024-03-23 (×4): qty 1

## 2024-03-23 MED ORDER — FENTANYL CITRATE (PF) 250 MCG/5ML IJ SOLN
INTRAMUSCULAR | Status: AC
Start: 2024-03-23 — End: 2024-03-23
  Filled 2024-03-23: qty 5

## 2024-03-23 MED ORDER — FENTANYL CITRATE (PF) 100 MCG/2ML IJ SOLN
INTRAMUSCULAR | Status: AC
  Filled 2024-03-23: qty 2

## 2024-03-23 MED ORDER — ATORVASTATIN CALCIUM 40 MG PO TABS
40.0000 mg | ORAL_TABLET | Freq: Every day | ORAL | Status: DC
  Administered 2024-03-23 – 2024-03-26 (×4): 40 mg via ORAL
  Filled 2024-03-23 (×4): qty 1

## 2024-03-23 MED ORDER — LACTATED RINGERS IV SOLN: INTRAVENOUS | Status: DC

## 2024-03-23 MED ORDER — MORPHINE SULFATE (PF) 4 MG/ML IV SOLN
4.0000 mg | INTRAVENOUS | Status: DC | PRN
  Administered 2024-03-23 (×2): 4 mg via INTRAVENOUS
  Filled 2024-03-23 (×2): qty 1

## 2024-03-23 MED ORDER — GABAPENTIN 300 MG PO CAPS
300.0000 mg | ORAL_CAPSULE | Freq: Three times a day (TID) | ORAL | Status: DC
  Administered 2024-03-23 (×2): 300 mg via ORAL
  Filled 2024-03-23 (×3): qty 1

## 2024-03-23 MED ORDER — OXYCODONE HCL 5 MG PO TABS
7.5000 mg | ORAL_TABLET | ORAL | Status: DC | PRN
  Administered 2024-03-23 – 2024-03-25 (×11): 7.5 mg via ORAL
  Filled 2024-03-23 (×11): qty 2

## 2024-03-23 MED ORDER — PROPOFOL 10 MG/ML IV BOLUS
INTRAVENOUS | Status: DC | PRN
Start: 2024-03-23 — End: 2024-03-23
  Administered 2024-03-23: 150 mg via INTRAVENOUS

## 2024-03-23 MED ORDER — MIDAZOLAM HCL 2 MG/2ML IJ SOLN
INTRAMUSCULAR | Status: AC
  Filled 2024-03-23: qty 2

## 2024-03-23 MED ORDER — BUPIVACAINE HCL (PF) 0.5 % IJ SOLN
INTRAMUSCULAR | Status: DC | PRN
  Administered 2024-03-23: 20 mL via PERINEURAL

## 2024-03-23 MED ORDER — ACETAMINOPHEN 500 MG PO TABS
1000.0000 mg | ORAL_TABLET | Freq: Once | ORAL | Status: AC
  Administered 2024-03-23: 1000 mg via ORAL
  Filled 2024-03-23: qty 2

## 2024-03-23 MED ORDER — CHLORHEXIDINE GLUCONATE 0.12 % MT SOLN: 15.0000 mL | Freq: Once | OROMUCOSAL | Status: AC

## 2024-03-23 MED ORDER — ORAL CARE MOUTH RINSE: 15.0000 mL | Freq: Once | OROMUCOSAL | Status: AC

## 2024-03-23 MED ORDER — DEXMEDETOMIDINE HCL IN NACL 80 MCG/20ML IV SOLN
INTRAVENOUS | Status: DC | PRN
Start: 2024-03-23 — End: 2024-03-23
  Administered 2024-03-23 (×2): 8 ug via INTRAVENOUS

## 2024-03-23 MED ORDER — PIPERACILLIN-TAZOBACTAM 3.375 G IVPB
3.3750 g | Freq: Three times a day (TID) | INTRAVENOUS | Status: DC
  Administered 2024-03-23 – 2024-03-26 (×11): 3.375 g via INTRAVENOUS
  Filled 2024-03-23 (×11): qty 50

## 2024-03-23 MED ORDER — LACTATED RINGERS IV SOLN: INTRAVENOUS | Status: AC

## 2024-03-23 MED ORDER — LIDOCAINE HCL 1 % IJ SOLN
INTRAMUSCULAR | Status: AC
  Filled 2024-03-23: qty 20

## 2024-03-23 MED ORDER — VANCOMYCIN HCL 1000 MG IV SOLR
1000.0000 mg | Freq: Three times a day (TID) | INTRAVENOUS | Status: DC
  Administered 2024-03-23 – 2024-03-26 (×10): 1000 mg via INTRAVENOUS
  Filled 2024-03-23 (×15): qty 20

## 2024-03-23 MED ORDER — ONDANSETRON HCL 4 MG/2ML IJ SOLN
INTRAMUSCULAR | Status: DC | PRN
  Administered 2024-03-23: 4 mg via INTRAVENOUS

## 2024-03-23 MED ORDER — FENTANYL CITRATE (PF) 250 MCG/5ML IJ SOLN
INTRAMUSCULAR | Status: DC | PRN
  Administered 2024-03-23: 50 ug via INTRAVENOUS
  Administered 2024-03-23 (×2): 100 ug via INTRAVENOUS

## 2024-03-23 MED ORDER — CHLORHEXIDINE GLUCONATE 0.12 % MT SOLN
OROMUCOSAL | Status: AC
  Administered 2024-03-23: 15 mL via OROMUCOSAL
  Filled 2024-03-23: qty 15

## 2024-03-23 MED ORDER — ORAL CARE MOUTH RINSE: 15.0000 mL | OROMUCOSAL | Status: DC | PRN

## 2024-03-23 MED ORDER — BUPIVACAINE LIPOSOME 1.3 % IJ SUSP
INTRAMUSCULAR | Status: DC | PRN
  Administered 2024-03-23: 10 mL via PERINEURAL

## 2024-03-23 MED ORDER — ACETAMINOPHEN 500 MG PO TABS
1000.0000 mg | ORAL_TABLET | Freq: Four times a day (QID) | ORAL | Status: DC | PRN
  Administered 2024-03-24: 1000 mg via ORAL
  Filled 2024-03-23: qty 2

## 2024-03-23 MED ORDER — PROPOFOL 500 MG/50ML IV EMUL
INTRAVENOUS | Status: DC | PRN
  Administered 2024-03-23: 40 mg via INTRAVENOUS
  Administered 2024-03-23: 125 ug/kg/min via INTRAVENOUS

## 2024-03-23 MED ORDER — 0.9 % SODIUM CHLORIDE (POUR BTL) OPTIME
TOPICAL | Status: DC | PRN
Start: 2024-03-23 — End: 2024-03-23
  Administered 2024-03-23: 1000 mL

## 2024-03-23 MED ORDER — ASPIRIN 81 MG PO TBEC
81.0000 mg | DELAYED_RELEASE_TABLET | Freq: Every day | ORAL | Status: DC
  Administered 2024-03-23 – 2024-03-26 (×4): 81 mg via ORAL
  Filled 2024-03-23 (×4): qty 1

## 2024-03-23 MED ORDER — FENTANYL CITRATE (PF) 100 MCG/2ML IJ SOLN
25.0000 ug | INTRAMUSCULAR | Status: DC | PRN
  Administered 2024-03-23: 50 ug via INTRAVENOUS

## 2024-03-23 MED ORDER — HYDROMORPHONE HCL 1 MG/ML IJ SOLN
0.5000 mg | INTRAMUSCULAR | Status: DC | PRN
  Administered 2024-03-23 (×2): 0.5 mg via INTRAVENOUS
  Filled 2024-03-23 (×2): qty 1

## 2024-03-23 MED ORDER — PROPOFOL 10 MG/ML IV BOLUS
INTRAVENOUS | Status: AC
  Filled 2024-03-23: qty 20

## 2024-03-23 MED ORDER — OXYCODONE HCL 5 MG/5ML PO SOLN: 5.0000 mg | Freq: Once | ORAL | Status: DC | PRN

## 2024-03-23 MED ORDER — AMISULPRIDE (ANTIEMETIC) 5 MG/2ML IV SOLN: 10.0000 mg | Freq: Once | INTRAVENOUS | Status: DC | PRN

## 2024-03-23 MED ORDER — VASHE WOUND IRRIGATION OPTIME
TOPICAL | Status: DC | PRN
Start: 2024-03-23 — End: 2024-03-23
  Administered 2024-03-23: 34 [oz_av]

## 2024-03-23 SURGICAL SUPPLY — 32 items
BAG COUNTER SPONGE SURGICOUNT (BAG) ×1 IMPLANT
BLADE SAW SGTL 81X20 HD (BLADE) IMPLANT
BNDG ELASTIC 4INX 5YD STR LF (GAUZE/BANDAGES/DRESSINGS) IMPLANT
BNDG GAUZE DERMACEA FLUFF 4 (GAUZE/BANDAGES/DRESSINGS) ×1 IMPLANT
CANISTER SUCTION 3000ML PPV (SUCTIONS) ×1 IMPLANT
CANISTER WOUNDNEG PRESSURE 500 (CANNISTER) IMPLANT
CLEANSER WND VASHE INSTL 34OZ (WOUND CARE) IMPLANT
COVER SURGICAL LIGHT HANDLE (MISCELLANEOUS) ×1 IMPLANT
DRAPE EXTREMITY T 121X128X90 (DISPOSABLE) IMPLANT
DRAPE HALF SHEET 40X57 (DRAPES) ×1 IMPLANT
DRSG VAC GRANUFOAM MED (GAUZE/BANDAGES/DRESSINGS) IMPLANT
ELECTRODE REM PT RTRN 9FT ADLT (ELECTROSURGICAL) ×1 IMPLANT
GAUZE SPONGE 4X4 12PLY STRL (GAUZE/BANDAGES/DRESSINGS) ×1 IMPLANT
GAUZE XEROFORM 1X8 LF (GAUZE/BANDAGES/DRESSINGS) IMPLANT
GLOVE BIO SURGEON STRL SZ7.5 (GLOVE) ×1 IMPLANT
GOWN STRL REUS W/ TWL LRG LVL3 (GOWN DISPOSABLE) ×2 IMPLANT
GOWN STRL REUS W/ TWL XL LVL3 (GOWN DISPOSABLE) ×1 IMPLANT
GRAFT SKIN WND MICRO 38 (Tissue) IMPLANT
KIT BASIN OR (CUSTOM PROCEDURE TRAY) ×1 IMPLANT
KIT TURNOVER KIT B (KITS) ×1 IMPLANT
NDL HYPO 25GX1X1/2 BEV (NEEDLE) IMPLANT
NEEDLE HYPO 25GX1X1/2 BEV (NEEDLE) IMPLANT
NS IRRIG 1000ML POUR BTL (IV SOLUTION) ×1 IMPLANT
PACK GENERAL/GYN (CUSTOM PROCEDURE TRAY) ×1 IMPLANT
PAD ARMBOARD POSITIONER FOAM (MISCELLANEOUS) ×2 IMPLANT
SPONGE T-LAP 18X18 ~~LOC~~+RFID (SPONGE) IMPLANT
SUT ETHILON 3 0 PS 1 (SUTURE) ×1 IMPLANT
SWAB COLLECTION DEVICE MRSA (MISCELLANEOUS) IMPLANT
SYR BULB IRRIG 60ML STRL (SYRINGE) IMPLANT
TOWEL GREEN STERILE (TOWEL DISPOSABLE) ×2 IMPLANT
UNDERPAD 30X36 HEAVY ABSORB (UNDERPADS AND DIAPERS) ×1 IMPLANT
WATER STERILE IRR 1000ML POUR (IV SOLUTION) ×1 IMPLANT

## 2024-03-23 NOTE — Anesthesia Procedure Notes (Signed)
 Procedure Name: LMA Insertion Date/Time: 03/23/2024 11:35 AM  Performed by: Marva Lonni PARAS, CRNAPre-anesthesia Checklist: Patient identified, Emergency Drugs available, Suction available and Patient being monitored Patient Re-evaluated:Patient Re-evaluated prior to induction Oxygen Delivery Method: Circle System Utilized Preoxygenation: Pre-oxygenation with 100% oxygen Induction Type: IV induction Ventilation: Mask ventilation without difficulty LMA: LMA with gastric port inserted LMA Size: 4.0 Number of attempts: 1 Airway Equipment and Method: Bite block Placement Confirmation: positive ETCO2 Tube secured with: Tape Dental Injury: Teeth and Oropharynx as per pre-operative assessment
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
## 2024-03-23 NOTE — Consult Note (Signed)
 Hospital Consult   History of Present Illness: This is a 58 y.o. 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  He is now here with dehiscence of his left great toe amputation site.  Past Medical History:  Diagnosis Date   Hyperlipidemia    Hypertension     Past Surgical History:  Procedure Laterality Date   ABDOMINAL AORTIC ENDOVASCULAR STENT GRAFT N/A 03/06/2024   Procedure: INSERTION, ENDOVASCULAR STENT GRAFT, AORTA, ABDOMINAL;LEFT RENAL ARTERY STENT;  Surgeon: Sheree Penne Bruckner, MD;  Location: [facility] OR;  Service: Vascular;  Laterality: N/A;   ABDOMINAL AORTOGRAM W/LOWER EXTREMITY N/A 03/05/2024   Procedure: ABDOMINAL AORTOGRAM W/LOWER EXTREMITY;  Surgeon: Sheree Penne Bruckner, MD;  Location: [facility] INVASIVE CV LAB;  Service: Cardiovascular;  Laterality: N/A;   AMPUTATION Left 03/06/2024   Procedure: AMPUTATION, LEFT GREAT TOE;  Surgeon: Sheree Penne Bruckner, MD;  Location: [facility] OR;  Service: Vascular;  Laterality: Left;   ENDARTERECTOMY FEMORAL Left 03/06/2024   Procedure: ENDARTERECTOMY, LEFT FEMORAL, LEFT SUPERFICIAL FEMORAL ARTERY;  Surgeon: Sheree Penne Bruckner, MD;  Location: [facility] OR;  Service: Vascular;  Laterality: Left;   HERNIA REPAIR     LOWER EXTREMITY INTERVENTION Left 03/05/2024   Procedure: LOWER EXTREMITY INTERVENTION;  Surgeon: Sheree Penne Bruckner, MD;  Location: [facility] INVASIVE CV LAB;  Service: Cardiovascular;  Laterality: Left;   PATCH ANGIOPLASTY Left 03/06/2024   Procedure: ANGIOPLASTY, USING 1cm x 14cm XENOSURE PATCH GRAFT;  Surgeon: Sheree Penne Bruckner, MD;  Location: [facility] OR;  Service: Vascular;  Laterality: Left;   SHOULDER SURGERY Right    TRANSCAROTID ARTERY  REVASCULARIZATION  Left 03/02/2024   Procedure: LEFT TRANSCAROTID ARTERY REVASCULARIZATION USING 8 X 40 MM ENROUTE TRANSCAROTID STENT;  Surgeon: Sheree Penne Bruckner, MD;  Location: [facility] OR;  Service: Vascular;  Laterality: Left;   ULTRASOUND GUIDANCE FOR VASCULAR ACCESS Bilateral 03/06/2024   Procedure: ULTRASOUND GUIDANCE, FOR VASCULAR ACCESS;  Surgeon: Sheree Penne Bruckner, MD;  Location: [facility] OR;  Service: Vascular;  Laterality: Bilateral;    No Known Allergies  Prior to Admission medications   Medication Sig Start Date End Date Taking? Authorizing Provider  acetaminophen  (TYLENOL ) 325 MG tablet Take 2 tablets (650 mg total) by mouth every 6 (six) hours as needed for mild pain (pain score 1-3) or fever (or Fever >/= 101). 03/09/24  Yes Kathrin Mignon DASEN, MD  aspirin  EC 81 MG tablet Take 1 tablet (81 mg total) by mouth daily. Swallow whole. 03/09/24  Yes Gonfa, Taye T, MD  atorvastatin  (LIPITOR) 40 MG tablet Take 1 tablet (40 mg total) by mouth daily. 03/09/24  Yes Gonfa, Taye T, MD  clopidogrel  (PLAVIX ) 75 MG tablet Take 1 tablet (75 mg total) by mouth daily. 03/09/24  Yes Gonfa, Taye T, MD  gabapentin  (NEURONTIN ) 300 MG capsule Take 2 capsules (600 mg total) by mouth 3 (three) times daily. Patient taking differently: Take 300 mg by mouth 3 (three) times daily. 03/10/24  Yes Gonfa, Taye T, MD  nicotine  (NICODERM CQ  - DOSED IN MG/24 HOURS) 21 mg/24hr patch Place 1 patch (21 mg total) onto the skin daily. 03/09/24  Yes Gonfa, Taye T, MD  HYDROmorphone  (DILAUDID ) 2 MG tablet Take 1-2 tablets (2-4 mg total) by mouth every 6 (six) hours as needed for moderate pain (pain score 4-6) or severe pain (  pain score 7-10). Patient not taking: Reported on 03/14/2024 03/09/24   Gonfa, Taye T, MD  senna-docusate (SENOKOT-S) 8.6-50 MG tablet Take 1-2 tablets by mouth 2 (two) times daily between meals as needed for mild constipation or moderate constipation. Patient not taking: Reported on 03/22/2024 03/09/24   Kathrin Mignon DASEN, MD    Social History   Socioeconomic History   Marital status: Divorced    Spouse name: Not on file   Number of children: Not on file   Years of education: Not on file   Highest education level: Not on file  Occupational History   Not on file  Tobacco Use   Smoking status: Former    Current packs/day: 0.33    Average packs/day: 0.3 packs/day for 0.7 years (0.2 ttl pk-yrs)    Types: Cigarettes    Start date: 2025   Smokeless tobacco: Never  Vaping Use   Vaping status: Never Used  Substance and Sexual Activity   Alcohol use: Yes    Comment: beer after work   Drug use: No   Sexual activity: Yes  Other Topics Concern   Not on file  Social History Narrative   Not on file   Social Drivers of Health   Financial Resource Strain: Low Risk  (10/05/2022)   Overall Financial Resource Strain (CARDIA)    Difficulty of Paying Living Expenses: Not hard at all  Food Insecurity: No Food Insecurity (03/23/2024)   Hunger Vital Sign    Worried About Running Out of Food in the Last Year: Never true    Ran Out of Food in the Last Year: Never true  Transportation Needs: No Transportation Needs (03/23/2024)   PRAPARE - Administrator, Civil Service (Medical): No    Lack of Transportation (Non-Medical): No  Physical Activity: Sufficiently Active (10/05/2022)   Exercise Vital Sign    Days of Exercise per Week: 7 days    Minutes of Exercise per Session: 40 min  Stress: No Stress Concern Present (04/07/2023)   [employer] of Occupational Health - Occupational Stress Questionnaire    Feeling of Stress : Not at all  Social Connections: Socially Isolated (10/05/2022)   Social Connection and Isolation Panel    Frequency of Communication with Friends and Family: More than three times a week    Frequency of Social Gatherings with Friends and Family: More than three times a week    Attends Religious Services: Never    Database administrator or Organizations: No    Attends  Banker Meetings: Never    Marital Status: Divorced  Catering manager Violence: Not At Risk (03/23/2024)   Humiliation, Afraid, Rape, and Kick questionnaire    Fear of Current or Ex-Partner: No    Emotionally Abused: No    Physically Abused: No    Sexually Abused: No     Family History  Problem Relation Age of Onset   Cancer Mother    Cancer Father    Colon cancer Neg Hx    Colon polyps Neg Hx    Crohn's disease Neg Hx    Esophageal cancer Neg Hx    Rectal cancer Neg Hx    Stomach cancer Neg Hx    Ulcerative colitis Neg Hx     ROS: Left great toe wound and pain   Physical Examination  Vitals:   03/23/24 0749 03/23/24 0901  BP: (!) 132/96 (!) 154/83  Pulse: 90 92  Resp: 18 18  Temp: 98 F (  36.7 C) 98.3 F (36.8 C)  SpO2: 100% 99%   Body mass index is 25.42 kg/m.  Awake alert and oriented Nonlabored respirations Right groin 2+ palpable Left groin incision healing well with mild fibrinous exudate no evidence of erythema or drainage Expected left lower extremity edema Palpable left posterior tibial pulse Dehisced left great toe amputation site  CBC    Component Value Date/Time   WBC 8.1 03/23/2024 0717   RBC 3.02 (L) 03/23/2024 0717   HGB 8.9 (L) 03/23/2024 0717   HGB 13.7 10/06/2023 1530   HCT 28.5 (L) 03/23/2024 0717   HCT 40.7 10/06/2023 1530   PLT 467 (H) 03/23/2024 0717   PLT 231 10/06/2023 1530   MCV 94.4 03/23/2024 0717   MCV 97 10/06/2023 1530   MCH 29.5 03/23/2024 0717   MCHC 31.2 03/23/2024 0717   RDW 17.3 (H) 03/23/2024 0717   RDW 12.5 10/06/2023 1530   LYMPHSABS 1.7 03/22/2024 1620   LYMPHSABS 2.6 10/06/2023 1530   MONOABS 0.6 03/22/2024 1620   EOSABS 0.2 03/22/2024 1620   EOSABS 0.3 10/06/2023 1530   BASOSABS 0.1 03/22/2024 1620   BASOSABS 0.1 10/06/2023 1530    BMET    Component Value Date/Time   NA 136 03/23/2024 0717   NA 136 10/06/2023 1530   K 3.6 03/23/2024 0717   CL 106 03/23/2024 0717   CO2 19 (L)  03/23/2024 0717   GLUCOSE 83 03/23/2024 0717   BUN 9 03/23/2024 0717   BUN 16 10/06/2023 1530   CREATININE 0.73 03/23/2024 0717   CALCIUM  8.8 (L) 03/23/2024 0717   GFRNONAA >60 03/23/2024 0717   GFRAA 112 02/16/2018 0939    COAGS: Lab Results  Component Value Date   INR 1.4 (H) 03/22/2024   INR 1.6 (H) 03/06/2024   INR 1.4 (H) 02/27/2024     Non-Invasive Vascular Imaging:   MRI IMPRESSION: 1. Recent amputation of the left first toe at the metatarsal-phalangeal level. Soft tissue changes, including a small loculated collection, at the margin of resection likely or postoperative but infection is not excluded. 2. Abnormal marrow signal intensity of the distal first metatarsal shaft may represent postoperative change, fracture, or osteomyelitis. 3. Mild diffuse soft tissue edema. 4. Motion artifact limits examination.   ASSESSMENT/PLAN: This is a 58 y.o. male here with dehiscence of left great toe amputation site.  Will plan for debridement with likely resection of the first metatarsal head in the OR today.  We discussed possible need for left second toe amputation and possible wound VAC placement.  All questions were answered he demonstrates good understanding and consent was signed.  Robina Hamor C. Sheree, MD [facility] Office: [phone] Pager: [phone]
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
## 2024-03-23 NOTE — Op Note (Signed)
    Patient name: DADEN MAHANY MRN: [id number] DOB: 24-Apr-1966 Sex: male  03/23/2024 Pre-operative Diagnosis: Necrotic/infected left great toe amputation site Post-operative diagnosis:  Same Surgeon:  Penne C. Sheree, MD Assistant: Curry Damme, PA Procedure Performed: 1.  Revision of left great toe amputation site with transmetatarsal amputation 2.  Sharp excisional debridement of left great toe amputation site skin and soft tissue totaling 2 x 2 cm 3.  Placement of 30 cm Kerecis fish skin 4.  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  He ultimately went revascularization which included EVAR with concomitant left renal artery stenting and subsequent left common femoral endarterectomy with antegrade stenting of the left SFA.  At that time he also underwent left great toe amputation.  Unfortunately this is failed to heal and he now has necrosis with evidence of infection.  He is indicated for debridement with further amputation and additional procedures as necessary.  Experience assistant was necessary to facilitate the metatarsal bone and transect this as well as to perform debridement of the skin and subcutaneous tissue.  Findings: There was frank purulence within the wound bed this was cultured with aerobic and anaerobic cultures.  The distal first metatarsal bone was unhealthy and this was transected to healthy appearing bone.  There was necrotic tissue and this was all debrided back to healthy appearing bleeding tissue.  We were then able to partially close the wound leaving a 2 x 2 cm wound in place and a wound VAC was placed at this level after fish skin was placed within the wound bed.   Procedure:  The patient was identified in the holding area and taken to the operating room where he was  put supine operative table and initially he had a block and MAC anesthesia was induced.  Patient could not tolerate the beginning of the procedure after timeout was called and thus was converted to LMA anesthesia.  We began by sharply debriding the tissue and then opened back on the first metatarsal using a 10 blade.  Further back on the first metatarsal that appeared healthy and this was transected with oscillating saw.  We then debrided subcutaneous tissue and skin all the way back to healthy bleeding tissue.  We then controlled hemostasis with pressure and cautery.  30 cm of Kerecis fish skin was placed in the wound bed.  We then closed all the way down to a level of 2 x 2 cm and placed a wound VAC at the distal open wound and this was fashioned in place to -125 mm of suction.  The patient was then awakened from anesthesia having tolerated the procedure well or may complication.  All counts were correct at completion.  EBL: 50 cc   Maxi Carreras C. Sheree, MD [facility] Office: [phone] Pager: [phone]

## 2024-03-23 NOTE — Anesthesia Procedure Notes (Signed)
 Anesthesia Regional Block: Popliteal block   Pre-Anesthetic Checklist: , timeout performed,  Correct Patient, Correct Site, Correct Laterality,  Correct Procedure, Correct Position, site marked,  Risks and benefits discussed,  Pre-op evaluation,  At surgeon's request and post-op pain management  Laterality: Left  Prep: Maximum Sterile Barrier Precautions used, chloraprep       Needles:  Injection technique: Single-shot  Needle Type: Echogenic Stimulator Needle     Needle Length: 9cm  Needle Gauge: 21     Additional Needles:   Procedures:,,,, ultrasound used (permanent image in chart),,    Narrative:  Start time: 03/23/2024 10:42 AM End time: 03/23/2024 10:47 AM Injection made incrementally with aspirations every 5 mL. Anesthesiologist: Niels Marien CROME, MD

## 2024-03-23 NOTE — Consult Note (Signed)
 WOC Nurse Consult Note: patient underwent left great toe amputation 03/06/2024 returning with dehiscence; also had critical limb ischemia and underwent L renal artery stenting by vascular  Reason for Consult: L toe and L groin  Wound type: 1. Full thickness L great toe post amputation as above with surgical wound dehiscence 100% tan fibrinous  2  L groin incision closed post vascular intervention  Pressure Injury POA: NA  Measurement: see nursing flowsheet  Wound bed: as above  Drainage (amount, consistency, odor) foul smelling drainage from L great toe  Periwound: erythema  Dressing procedure/placement/frequency:  Cleanse L great toe dehiscence with Vashe wound cleanser Soila [phone]) do not rinse and allow to air dry.  Apply Vashe moistened gauze to wound bed daily, cover with dry gauze and secure with Kerlix roll gauze.   Cleanse L groin incision with Vashe, apply a small piece of silver hydrofiber Soila [phone]) to incision site daily, cover with silicone foam.   POC discussed with bedside nurse.  WOC team will not follow. Patient is pending vascular consult, any wound care orders placed by vascular surgeon supercede wound care orders placed by [facility] RN.   Thank you,    Powell Bar MSN, RN-BC, [employer]

## 2024-03-23 NOTE — Telephone Encounter (Signed)
 Noted. Thanks.

## 2024-03-23 NOTE — Anesthesia Preprocedure Evaluation (Addendum)
 Anesthesia Evaluation  Patient identified by MRN, date of birth, ID band Patient awake    Reviewed: Allergy & Precautions, NPO status , Patient's Chart, lab work & pertinent test results  Airway Mallampati: II  TM Distance: >3 FB Neck ROM: Full    Dental  (+) Poor Dentition, Dental Advisory Given, Chipped   Pulmonary Current Smoker and Patient abstained from smoking., former smoker   Pulmonary exam normal breath sounds clear to auscultation       Cardiovascular hypertension, + Peripheral Vascular Disease  Normal cardiovascular exam Rhythm:Regular Rate:Normal  TTE 2025  1. Left ventricular ejection fraction, by estimation, is 60 to 65%. The  left ventricle has normal function. The left ventricle has no regional  wall motion abnormalities. Left ventricular diastolic parameters are  consistent with Grade I diastolic  dysfunction (impaired relaxation).   2. Right ventricular systolic function is normal. The right ventricular  size is normal. There is normal pulmonary artery systolic pressure.   3. The mitral valve is normal in structure. No evidence of mitral valve  regurgitation. No evidence of mitral stenosis.   4. The aortic valve was not well visualized. There is mild calcification  of the aortic valve. There is mild thickening of the aortic valve. Aortic  valve regurgitation is not visualized. Aortic valve sclerosis is present,  with no evidence of aortic valve   stenosis.   5. The inferior vena cava is normal in size with greater than 50%  respiratory variability, suggesting right atrial pressure of 3 mmHg.     Neuro/Psych CVA  negative psych ROS   GI/Hepatic negative GI ROS, Neg liver ROS,,,  Endo/Other  negative endocrine ROS    Renal/GU negative Renal ROS  negative genitourinary   Musculoskeletal negative musculoskeletal ROS (+)    Abdominal   Peds  Hematology  (+) Blood dyscrasia (plavix ), anemia Lab  Results      Component                Value               Date                      WBC                      8.1                 03/23/2024                HGB                      8.9 (L)             03/23/2024                HCT                      28.5 (L)            03/23/2024                MCV                      94.4                03/23/2024                PLT  467 (H)             03/23/2024              Anesthesia Other Findings 58 y.o. 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  He is now here with dehiscence of his left great toe amputation site.  Reproductive/Obstetrics                              Anesthesia Physical Anesthesia Plan  ASA: 3  Anesthesia Plan: MAC and Regional   Post-op Pain Management: Regional block* and Tylenol  PO (pre-op)*   Induction: Intravenous  PONV Risk Score and Plan: 1 and Propofol  infusion, Treatment may vary due to age or medical condition, Midazolam  and Ondansetron   Airway Management Planned: Natural Airway  Additional Equipment:   Intra-op Plan:   Post-operative Plan:   Informed Consent: I have reviewed the patients History and Physical, chart, labs and discussed the procedure including the risks, benefits and alternatives for the proposed anesthesia with the patient or authorized representative who has indicated his/her understanding and acceptance.     Dental advisory given  Plan Discussed with: CRNA  Anesthesia Plan Comments:         Anesthesia Quick Evaluation

## 2024-03-23 NOTE — Plan of Care (Signed)

## 2024-03-23 NOTE — Progress Notes (Signed)
 Pharmacy Antibiotic Note  Todd [name] is a 58 y.o. male admitted on 03/22/2024 with post-op wound infection.  Pharmacy has been consulted for vancomycin  dosing.  Plan: Vancomycin  1000mg  IV Q8H. Goal AUC 400-550.  Expected AUC 460.  SCr used 0.8.   Height: 6' 2 (188 cm) Weight: 89.8 kg (198 lb) IBW/kg (Calculated) : 82.2  Temp (24hrs), Avg:97.8 F (36.6 C), Min:97.5 F (36.4 C), Max:98.1 F (36.7 C)  Recent Labs  Lab 03/22/24 1620 03/22/24 1636  WBC 6.6  --   CREATININE 0.72  --   LATICACIDVEN  --  1.8    Estimated Creatinine Clearance: 115.6 mL/min (by C-G formula based on SCr of 0.72 mg/dL).    No Known Allergies  Thank you for allowing pharmacy to be a part of this patient's care.  Marvetta Dauphin, PharmD, BCPS  03/23/2024 12:49 AM

## 2024-03-23 NOTE — Progress Notes (Addendum)
 Transition of Care [facility]) - Inpatient Brief Assessment   Patient Details  Name: Todd [name] MRN: [id number] Date of Birth: 04-20-1966  Transition of Care [facility]) CM/SW Contact:    Rosaline JONELLE Joe, RN Phone Number: 03/23/2024, 4:25 PM   Clinical Narrative: Patient is S/P revision of Left large toe amputation with [employer] wound vac in place at this time.  Patient lives at home with spouse and plans to return home when stable.  Patient has RW at home.  Patient states that he was active with [facility] RN prior to admission to the hospital.  Message sent to vascular surgeon to see if wound vac is needed for home prior to placing the referral.  Dr. Sheree states that he plans to change patient's wound vac dressing on Monday/Tuesday and will likely need [employer] wound vac for home.  I called Randine Pope, [employer] rep and ordered wound vac - e-script to be emailed to Dr. Sheree to co-sign.  MD is aware.  HH orders for RN [facility] placed to be co-signed.  Patient is already active with [facility].  Artavia, RNCM is aware that patient will likely discharge with wound vac next week or will need dressing changes regardless - pending Dr. Claretta discharge orders.  Patient plans to have wife provide transportation to home by car when stable.  Patient has RW at home.    Transition of Care Asessment: Insurance and Status: (P) Insurance coverage has been reviewed Patient has primary care physician: (P) Yes Home environment has been reviewed: (P) from home with spouse Prior level of function:: (P) self Prior/Current Home Services: (P) Current home services (active with [facility] RN) Social Drivers of Health Review: (P) SDOH reviewed needs interventions Readmission risk has been reviewed: (P) Yes Transition of care needs: (P) transition of care needs identified, TOC will continue to follow

## 2024-03-23 NOTE — Progress Notes (Signed)
 PT Cancellation Note  Patient Details Name: Todd [name] MRN: [id number] DOB: 14-Jan-1966   Cancelled Treatment:    Reason Eval/Treat Not Completed: Patient at procedure or test/unavailable; patient off the floor for surgery.  Will follow up when pt available and schedule allows.    [name] 03/23/2024, 9:33 AM [name], PT Acute Rehabilitation Services Office:[phone] 03/23/2024

## 2024-03-23 NOTE — Transfer of Care (Signed)
 Immediate Anesthesia Transfer of Care Note  Patient: Todd [name]  Procedure(s) Performed: LEFT GREAT TOE, TRANSMETATARSAL AMPUTATION (Left: Foot) APPLICATION, WOUND VAC (Left: Foot) IRRIGATION AND DEBRIDEMENT OF LEFT GREAT TOE AMPUTATION (Left: Foot)  Patient Location: PACU  Anesthesia Type:General and Regional  Level of Consciousness: awake, alert , oriented, and patient cooperative  Airway & Oxygen Therapy: Patient Spontanous Breathing  Post-op Assessment: Report given to RN and Post -op Vital signs reviewed and stable  Post vital signs: Reviewed and stable  Last Vitals:  Vitals Value Taken Time  BP 138/86 03/23/24 12:15  Temp    Pulse 90 03/23/24 12:16  Resp 13 03/23/24 12:16  SpO2 98 % 03/23/24 12:16  Vitals shown include unfiled device data.  Last Pain:  Vitals:   03/23/24 0901  TempSrc: Oral  PainSc:       Patients Stated Pain Goal: 6 (03/23/24 0728)  Complications: No notable events documented.

## 2024-03-23 NOTE — Anesthesia Postprocedure Evaluation (Signed)
 Anesthesia Post Note  Patient: Todd [name]  Procedure(s) Performed: LEFT GREAT TOE, TRANSMETATARSAL AMPUTATION (Left: Foot) APPLICATION, WOUND VAC (Left: Foot) IRRIGATION AND DEBRIDEMENT OF LEFT GREAT TOE AMPUTATION (Left: Foot)     Patient location during evaluation: PACU Anesthesia Type: General and Regional Level of consciousness: awake and alert Pain management: pain level controlled Vital Signs Assessment: post-procedure vital signs reviewed and stable Respiratory status: spontaneous breathing, nonlabored ventilation, respiratory function stable and patient connected to nasal cannula oxygen Cardiovascular status: blood pressure returned to baseline and stable Postop Assessment: no apparent nausea or vomiting Anesthetic complications: no   No notable events documented.  Last Vitals:  Vitals:   03/23/24 1245 03/23/24 1600  BP: 96/64 107/70  Pulse: 67 (!) 107  Resp: 14 16  Temp: 36.6 C 37.9 C  SpO2: 97% 98%    Last Pain:  Vitals:   03/23/24 1600  TempSrc: Oral  PainSc:                  Lonzo Saulter L Annelies Coyt

## 2024-03-23 NOTE — Progress Notes (Signed)
 Orthopedic Tech Progress Note Patient Details:  AUGUSTO DECKMAN [birth year] [id number]  Patient ID: Lynwood CHRISTELLA Rail, male   DOB: 29-Nov-1965, 58 y.o.   MRN: [id number] Pt. Already has POS. Adine MARLA Blush 03/23/2024, 6:32 PM

## 2024-03-24 DIAGNOSIS — T8142XA Infection following a procedure, deep incisional surgical site, initial encounter: Secondary | ICD-10-CM | POA: Diagnosis not present

## 2024-03-24 LAB — CBC
HCT: 26.3 % — ABNORMAL LOW (ref 39.0–52.0)
Hemoglobin: 8.3 g/dL — ABNORMAL LOW (ref 13.0–17.0)
MCH: 29.3 pg (ref 26.0–34.0)
MCHC: 31.6 g/dL (ref 30.0–36.0)
MCV: 92.9 fL (ref 80.0–100.0)
Platelets: 369 K/uL (ref 150–400)
RBC: 2.83 MIL/uL — ABNORMAL LOW (ref 4.22–5.81)
RDW: 17.3 % — ABNORMAL HIGH (ref 11.5–15.5)
WBC: 7.4 K/uL (ref 4.0–10.5)
nRBC: 0 % (ref 0.0–0.2)

## 2024-03-24 LAB — BASIC METABOLIC PANEL WITH GFR
Anion gap: 12 (ref 5–15)
BUN: 5 mg/dL — ABNORMAL LOW (ref 6–20)
CO2: 19 mmol/L — ABNORMAL LOW (ref 22–32)
Calcium: 8.4 mg/dL — ABNORMAL LOW (ref 8.9–10.3)
Chloride: 101 mmol/L (ref 98–111)
Creatinine, Ser: 0.82 mg/dL (ref 0.61–1.24)
GFR, Estimated: >60 mL/min (ref >60)
Glucose, Bld: 103 mg/dL — ABNORMAL HIGH (ref 70–99)
Potassium: 3.5 mmol/L (ref 3.5–5.1)
Sodium: 132 mmol/L — ABNORMAL LOW (ref 135–145)

## 2024-03-24 LAB — MAGNESIUM: Magnesium: 1.7 mg/dL (ref 1.7–2.4)

## 2024-03-24 MED ORDER — MORPHINE SULFATE (PF) 4 MG/ML IV SOLN
4.0000 mg | INTRAVENOUS | Status: DC | PRN
  Administered 2024-03-24 (×2): 4 mg via INTRAVENOUS
  Filled 2024-03-24 (×2): qty 1

## 2024-03-24 MED ORDER — GABAPENTIN 300 MG PO CAPS
600.0000 mg | ORAL_CAPSULE | Freq: Three times a day (TID) | ORAL | Status: DC
  Administered 2024-03-24 – 2024-03-26 (×7): 600 mg via ORAL
  Filled 2024-03-24 (×7): qty 2

## 2024-03-24 MED ORDER — HYDROMORPHONE HCL 1 MG/ML IJ SOLN: 0.5000 mg | INTRAMUSCULAR | Status: DC | PRN

## 2024-03-24 MED ORDER — MORPHINE SULFATE (PF) 2 MG/ML IV SOLN
2.0000 mg | Freq: Once | INTRAVENOUS | Status: AC
  Administered 2024-03-24: 2 mg via INTRAVENOUS
  Filled 2024-03-24: qty 1

## 2024-03-24 MED ORDER — ACETAMINOPHEN 500 MG PO TABS
1000.0000 mg | ORAL_TABLET | Freq: Four times a day (QID) | ORAL | Status: DC
  Administered 2024-03-24 – 2024-03-26 (×7): 1000 mg via ORAL
  Filled 2024-03-24 (×7): qty 2

## 2024-03-24 NOTE — Progress Notes (Signed)
 [facility] [place] PROGRESS NOTE  ASSESSMENT / PLAN: Todd [name] is a 58 y.o. male status post open revision of left great toe ray amputation.  Continue wound VAC therapy.  This will be changed Tuesday.  I have adjusted his pain medicine today.  Okay to mobilize.  Will follow along closely.  SUBJECTIVE: Severe pain after block wore off last night.  Limited relief with morphine .  Requesting something stronger.  No other issues today.  OBJECTIVE: BP (!) 145/94 (BP Location: Left Arm)   Pulse (!) 102   Temp 98.8 F (37.1 C)   Resp 18   Ht 6' 2 (1.88 m)   Wt 89.8 kg   SpO2 99%   BMI 25.42 kg/m   Intake/Output Summary (Last 24 hours) at 03/24/2024 1038 Last data filed at 03/24/2024 0758 Gross per 24 hour  Intake 1785.56 ml  Output 500 ml  Net 1285.56 ml    No distress Left foot is healthy appearing with nylon sutures in the medial aspect which are well-approximated.  There is a small VAC with good seal and suction.     Latest Ref Rng & Units 03/24/2024    5:30 AM 03/23/2024    7:17 AM 03/22/2024    4:20 PM  CBC  WBC 4.0 - 10.5 K/uL 7.4  8.1  6.6   Hemoglobin 13.0 - 17.0 g/dL 8.3  8.9  9.6   Hematocrit 39.0 - 52.0 % 26.3  28.5  30.6   Platelets 150 - 400 K/uL 369  467  560         Latest Ref Rng & Units 03/24/2024    5:30 AM 03/23/2024    7:17 AM 03/22/2024    4:20 PM  CMP  Glucose 70 - 99 mg/dL 896  83  93   BUN 6 - 20 mg/dL 5  9  13    Creatinine 0.61 - 1.24 mg/dL 9.17  9.26  9.27   Sodium 135 - 145 mmol/L 132  136  138   Potassium 3.5 - 5.1 mmol/L 3.5  3.6  3.5   Chloride 98 - 111 mmol/L 101  106  105   CO2 22 - 32 mmol/L 19  19  20    Calcium  8.9 - 10.3 mg/dL 8.4  8.8  9.4   Total Protein 6.5 - 8.1 g/dL   7.9   Total Bilirubin 0.0 - 1.2 mg/dL   0.6   Alkaline Phos 38 - 126 U/L   135   AST 15 - 41 U/L   25   ALT 0 - 44 U/L   31     Estimated Creatinine Clearance: 112.8 mL/min (by C-G formula based on SCr of 0.82 mg/dL).  [name].  [name], MD [facility] Phone Number: [phone] 03/24/2024 10:38 AM

## 2024-03-24 NOTE — Evaluation (Signed)
 Occupational Therapy Evaluation Patient Details Name: Todd [name] MRN: [id number] DOB: 12/26/1965 Today's Date: 03/24/2024   History of Present Illness   58 y.o. male presents to [facility] hospital on 03/22/2024 with concern for wound dehiscence and possible infection at L great toe amputation site. Pt recently admitted earlier this month for CVA, LLE revascularization and great toe amputation and TCAR. Pt underwent revision of L transmetatarsal amputation on 8/29. PMH includes HTN, CVA, PAD, AAA, pulmonary nodule.     Clinical Impressions Pt admitted for above, PTA pt lived with family and reports being ind with his ADLs, ambulating at home without challenge. Pt currently limited by L foot pain and impaired balance, needing CGA + RW to attempt ambulating with hop to gait as he declined any WB efforts through LLE in post op shoe. He is very cautious and protective of his Lt foot. Pt able to complete Adls with min A to setup A. OT to continue following pt acutely to progress closer to baseline and help transition to next level of care. Patient would benefit from post acute Home OT services to help maximize functional independence in natural environment      If plan is discharge home, recommend the following:   A little help with bathing/dressing/bathroom;Assistance with cooking/housework;Assist for transportation;Direct supervision/assist for medications management;Direct supervision/assist for financial management;Supervision due to cognitive status     Functional Status Assessment   Patient has had a recent decline in their functional status and demonstrates the ability to make significant improvements in function in a reasonable and predictable amount of time.     Equipment Recommendations   None recommended by OT (has needed DME)     Recommendations for Other Services         Precautions/Restrictions   Precautions Precautions: Fall Recall of Precautions/Restrictions:  Intact Precaution/Restrictions Comments: wound vac L foot Required Braces or Orthoses: Other Brace Other Brace: darco shoe Restrictions Weight Bearing Restrictions Per Provider Order: Yes LLE Weight Bearing Per Provider Order: Weight bearing as tolerated Other Position/Activity Restrictions: heel WB in [place]     Mobility Bed Mobility               General bed mobility comments: Pt received in recliner    Transfers Overall transfer level: Needs assistance Equipment used: Rolling walker (2 wheels) Transfers: Sit to/from Stand Sit to Stand: Supervision                  Balance Overall balance assessment: Needs assistance Sitting-balance support: No upper extremity supported, Feet supported Sitting balance-Leahy Scale: Good     Standing balance support: Bilateral upper extremity supported, Reliant on assistive device for balance Standing balance-Leahy Scale: Poor                             ADL either performed or assessed with clinical judgement   ADL Overall ADL's : Needs assistance/impaired     Grooming: Sitting;Set up;Wash/dry face;Brushing hair   Upper Body Bathing: Sitting;Set up   Lower Body Bathing: Set up;Sitting/lateral leans   Upper Body Dressing : Sitting;Set up   Lower Body Dressing: Minimal assistance;Sit to/from stand   Toilet Transfer: Rolling walker (2 wheels);Contact guard assist Toilet Transfer Details (indicate cue type and reason): hop to gait Toileting- Clothing Manipulation and Hygiene: Sitting/lateral lean;Contact guard assist       Functional mobility during ADLs: Rolling walker (2 wheels);Contact guard assist       Vision  Vision Assessment?: No apparent visual deficits     Perception         Praxis         Pertinent Vitals/Pain Pain Assessment Pain Assessment: 0-10 Faces Pain Scale: Hurts little more Pain Location: L foot Pain Descriptors / Indicators: Sore Pain Intervention(s): Premedicated  before session, Monitored during session, Repositioned     Extremity/Trunk Assessment Upper Extremity Assessment Upper Extremity Assessment: Overall WFL for tasks assessed   Lower Extremity Assessment Lower Extremity Assessment: LLE deficits/detail RLE Deficits / Details: ROM and strength WFL, ankle DF/PF not formally assessed   Cervical / Trunk Assessment Cervical / Trunk Assessment: Normal   Communication Communication Communication: Impaired Factors Affecting Communication: Reduced clarity of speech   Cognition Arousal: Alert Behavior During Therapy: WFL for tasks assessed/performed Cognition: No apparent impairments                               Following commands: Intact       Cueing  General Comments   Cueing Techniques: Verbal cues  VSS on RA   Exercises     Shoulder Instructions      Home Living Family/patient expects to be discharged to:: Private residence Living Arrangements: Spouse/significant other;Children Available Help at Discharge: Other (Comment) (spouse and family) Type of Home: House Home Access: Stairs to enter [employer] of Steps: 3 Entrance Stairs-Rails: Right;Left Home Layout: Able to live on main level with bedroom/bathroom     Bathroom Shower/Tub: Producer, television/film/video: Handicapped height     Home Equipment: Agricultural consultant (2 wheels);Grab bars - toilet (pt believes he can borrow a wheelchair from a friend if necessary)          Prior Functioning/Environment Prior Level of Function : Independent/Modified Independent             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, 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 hospitalizations ADLs Comments: ind ADLs    OT Problem List: Pain;Impaired balance (sitting and/or standing)   OT Treatment/Interventions: Therapeutic exercise;Balance training;Patient/family education;Therapeutic activities;DME and/or AE  instruction;Self-care/ADL training      OT Goals(Current goals can be found in the care plan section)   Acute Rehab OT Goals Patient Stated Goal: To go home OT Goal Formulation: With patient Time For Goal Achievement: 04/07/24 Potential to Achieve Goals: Good   OT Frequency:  Min 2X/week    Co-evaluation              AM-PAC OT 6 Clicks Daily Activity     Outcome Measure Help from another person eating meals?: None Help from another person taking care of personal grooming?: A Little Help from another person toileting, which includes using toliet, bedpan, or urinal?: A Little Help from another person bathing (including washing, rinsing, drying)?: A Little Help from another person to put on and taking off regular upper body clothing?: A Little Help from another person to put on and taking off regular lower body clothing?: A Little 6 Click Score: 19   End of Session Equipment Utilized During Treatment: Gait belt;Rolling walker (2 wheels) Nurse Communication: Mobility status  Activity Tolerance: Patient tolerated treatment well Patient left: in chair;with call bell/phone within reach;with chair alarm set  OT Visit Diagnosis: Other abnormalities of gait and mobility (R26.89);Pain Pain - Right/Left: Left Pain - part of body: Ankle and joints of foot  Time: 8845-8773 OT Time Calculation (min): 32 min Charges:  OT General Charges $OT Visit: 1 Visit OT Evaluation $OT Eval Low Complexity: 1 Low OT Treatments $Therapeutic Activity: 8-22 mins  03/24/2024  AB, OTR/L  Acute Rehabilitation Services  Office: [phone]   Curtistine JONETTA Das 03/24/2024, 1:24 PM

## 2024-03-24 NOTE — Evaluation (Signed)
 Physical Therapy Evaluation Patient Details Name: Todd [name] MRN: [id number] DOB: [birth year] Today's Date: 03/24/2024  History of Present Illness  58 y.o. male presents to [facility] hospital on 03/22/2024 with concern for wound dehiscence and possible infection at L great toe amputation site. Pt recently admitted earlier this month for CVA, LLE revascularization and great toe amputation and TCAR. Pt underwent revision of L transmetatarsal amputation on 8/29. PMH includes HTN, CVA, PAD, AAA, pulmonary nodule.  Clinical Impression  Pt presents to PT with deficits in activity tolerance, functional mobility and gait. Pt expresses significant concerns about mobilizing as he wants to ensure the wound will heal on his left foot. PT provides education on orders for heel touch weightbearing. Pt is able to transfer with support of RW and maintains heel WB well. Pt does decline ambulation. Pt will benefit from further gait and stair training during admission.        If plan is discharge home, recommend the following: Assistance with cooking/housework;Assist for transportation;Help with stairs or ramp for entrance   Can travel by private vehicle        Equipment Recommendations None recommended by PT (pt declines BSC)  Recommendations for Other Services       Functional Status Assessment Patient has had a recent decline in their functional status and demonstrates the ability to make significant improvements in function in a reasonable and predictable amount of time.     Precautions / Restrictions Precautions Precautions: Fall Recall of Precautions/Restrictions: Intact Precaution/Restrictions Comments: wound vac L foot Restrictions Weight Bearing Restrictions Per Provider Order: Yes LLE Weight Bearing Per Provider Order:  (heel touch weightbearing per orders)      Mobility  Bed Mobility Overal bed mobility: Modified Independent                  Transfers Overall transfer level: Needs  assistance Equipment used: Rolling walker (2 wheels) Transfers: Sit to/from Stand, Bed to chair/wheelchair/BSC Sit to Stand: Supervision   Step pivot transfers: Contact guard assist            Ambulation/Gait Ambulation/Gait assistance:  (pt declines further ambulation after transfer to chair, concerned about protecting wound)                Stairs            Wheelchair Mobility     Tilt Bed    Modified Rankin (Stroke Patients Only)       Balance Overall balance assessment: Needs assistance Sitting-balance support: No upper extremity supported, Feet supported Sitting balance-Leahy Scale: Good     Standing balance support: Bilateral upper extremity supported, Reliant on assistive device for balance Standing balance-Leahy Scale: Poor                               Pertinent Vitals/Pain Pain Assessment Pain Assessment: Faces Faces Pain Scale: Hurts even more Pain Location: L foot Pain Descriptors / Indicators: Sore Pain Intervention(s): Premedicated before session    Home Living Family/patient expects to be discharged to:: Private residence Living Arrangements: Spouse/significant other;Children Available Help at Discharge: Other (Comment) (spouse and family) Type of Home: House Home Access: Stairs to enter Entrance Stairs-Rails: Doctor, general practice of Steps: 3   Home Layout: Able to live on main level with bedroom/bathroom Home Equipment: Rolling Walker (2 wheels);Grab bars - toilet (pt believes he can borrow a wheelchair from a friend if necessary)      Prior  Function Prior Level of Function : Independent/Modified Independent             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, 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 hospitalizations       Extremity/Trunk Assessment   Upper Extremity Assessment Upper Extremity Assessment: Overall WFL for tasks assessed    Lower Extremity  Assessment Lower Extremity Assessment: LLE deficits/detail RLE Deficits / Details: ROM and strength WFL, ankle DF/PF not formally assessed    Cervical / Trunk Assessment Cervical / Trunk Assessment: Normal  Communication   Communication Communication: Impaired Factors Affecting Communication: Reduced clarity of speech    Cognition Arousal: Alert Behavior During Therapy: WFL for tasks assessed/performed   PT - Cognitive impairments: No apparent impairments                         Following commands: Intact       Cueing Cueing Techniques: Verbal cues     General Comments General comments (skin integrity, edema, etc.): VSS on RA    Exercises     Assessment/Plan    PT Assessment Patient needs continued PT services  PT Problem List Decreased strength;Decreased activity tolerance;Decreased balance;Decreased mobility;Decreased knowledge of use of DME;Decreased knowledge of precautions;Pain       PT Treatment Interventions DME instruction;Stair training;Gait training;Functional mobility training;Therapeutic activities;Balance training;Therapeutic exercise;Neuromuscular re-education;Patient/family education;Wheelchair mobility training    PT Goals (Current goals can be found in the Care Plan section)  Acute Rehab PT Goals Patient Stated Goal: to let wound heal, later return to independence PT Goal Formulation: With patient Time For Goal Achievement: 04/07/24 Potential to Achieve Goals: Fair    Frequency Min 2X/week     Co-evaluation               AM-PAC PT 6 Clicks Mobility  Outcome Measure Help needed turning from your back to your side while in a flat bed without using bedrails?: None Help needed moving from lying on your back to sitting on the side of a flat bed without using bedrails?: None Help needed moving to and from a bed to a chair (including a wheelchair)?: A Little Help needed standing up from a chair using your arms (e.g., wheelchair or  bedside chair)?: A Little Help needed to walk in hospital room?: A Lot Help needed climbing 3-5 steps with a railing? : A Lot 6 Click Score: 18    End of Session Equipment Utilized During Treatment: Gait belt Activity Tolerance: Patient tolerated treatment well Patient left: in chair;with call bell/phone within reach;with chair alarm set Nurse Communication: Mobility status PT Visit Diagnosis: Other abnormalities of gait and mobility (R26.89);Unsteadiness on feet (R26.81);Pain Pain - Right/Left: Left Pain - part of body: Ankle and joints of foot    Time: 1035-1055 PT Time Calculation (min) (ACUTE ONLY): 20 min   Charges:   PT Evaluation $PT Eval Low Complexity: 1 Low   PT General Charges $$ ACUTE PT VISIT: 1 Visit         Bernardino JINNY Ruth, PT, DPT Acute Rehabilitation Office [phone]   Bernardino JINNY Ruth 03/24/2024, 11:18 AM

## 2024-03-24 NOTE — Plan of Care (Signed)
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
## 2024-03-25 DIAGNOSIS — T8142XA Infection following a procedure, deep incisional surgical site, initial encounter: Secondary | ICD-10-CM | POA: Diagnosis not present

## 2024-03-25 DIAGNOSIS — E785 Hyperlipidemia, unspecified: Secondary | ICD-10-CM | POA: Diagnosis present

## 2024-03-25 DIAGNOSIS — M869 Osteomyelitis, unspecified: Secondary | ICD-10-CM | POA: Diagnosis present

## 2024-03-25 LAB — CBC WITH DIFFERENTIAL/PLATELET
Abs Immature Granulocytes: 0.03 K/uL (ref 0.00–0.07)
Basophils Absolute: 0 K/uL (ref 0.0–0.1)
Basophils Relative: 1 %
Eosinophils Absolute: 0.1 K/uL (ref 0.0–0.5)
Eosinophils Relative: 2 %
HCT: 23.5 % — ABNORMAL LOW (ref 39.0–52.0)
Hemoglobin: 7.3 g/dL — ABNORMAL LOW (ref 13.0–17.0)
Immature Granulocytes: 1 %
Lymphocytes Relative: 17 %
Lymphs Abs: 0.9 K/uL (ref 0.7–4.0)
MCH: 29.3 pg (ref 26.0–34.0)
MCHC: 31.1 g/dL (ref 30.0–36.0)
MCV: 94.4 fL (ref 80.0–100.0)
Monocytes Absolute: 0.7 K/uL (ref 0.1–1.0)
Monocytes Relative: 12 %
Neutro Abs: 3.8 K/uL (ref 1.7–7.7)
Neutrophils Relative %: 67 %
Platelets: 294 K/uL (ref 150–400)
RBC: 2.49 MIL/uL — ABNORMAL LOW (ref 4.22–5.81)
RDW: 17.8 % — ABNORMAL HIGH (ref 11.5–15.5)
WBC: 5.5 K/uL (ref 4.0–10.5)
nRBC: 0 % (ref 0.0–0.2)

## 2024-03-25 LAB — BASIC METABOLIC PANEL WITH GFR
Anion gap: 9 (ref 5–15)
BUN: 5 mg/dL — ABNORMAL LOW (ref 6–20)
CO2: 22 mmol/L (ref 22–32)
Calcium: 8.1 mg/dL — ABNORMAL LOW (ref 8.9–10.3)
Chloride: 105 mmol/L (ref 98–111)
Creatinine, Ser: 0.83 mg/dL (ref 0.61–1.24)
GFR, Estimated: >60 mL/min (ref >60)
Glucose, Bld: 148 mg/dL — ABNORMAL HIGH (ref 70–99)
Potassium: 2.9 mmol/L — ABNORMAL LOW (ref 3.5–5.1)
Sodium: 136 mmol/L (ref 135–145)

## 2024-03-25 MED ORDER — POTASSIUM CHLORIDE CRYS ER 20 MEQ PO TBCR
40.0000 meq | EXTENDED_RELEASE_TABLET | Freq: Once | ORAL | Status: AC
  Administered 2024-03-25: 40 meq via ORAL
  Filled 2024-03-25: qty 2

## 2024-03-25 NOTE — Progress Notes (Addendum)
  Progress Note    03/25/2024 10:19 AM 2 Days Post-Op  Subjective:  pain better controlled   Vitals:   03/25/24 0517 03/25/24 0757  BP: 130/76 121/73  Pulse: 98 76  Resp: 18   Temp: 98.8 F (37.1 C) 98.4 F (36.9 C)  SpO2: 100% 98%   Physical Exam: Cardiac:  regular Lungs:  non labored Incisions:  left foot incision is intact and well appearing, VAC to suction Extremities:  LLE remains well perfused and warm  Neurologic: alert and oriented   CBC    Component Value Date/Time   WBC 7.4 03/24/2024 0530   RBC 2.83 (L) 03/24/2024 0530   HGB 8.3 (L) 03/24/2024 0530   HGB 13.7 10/06/2023 1530   HCT 26.3 (L) 03/24/2024 0530   HCT 40.7 10/06/2023 1530   PLT 369 03/24/2024 0530   PLT 231 10/06/2023 1530   MCV 92.9 03/24/2024 0530   MCV 97 10/06/2023 1530   MCH 29.3 03/24/2024 0530   MCHC 31.6 03/24/2024 0530   RDW 17.3 (H) 03/24/2024 0530   RDW 12.5 10/06/2023 1530   LYMPHSABS 1.7 03/22/2024 1620   LYMPHSABS 2.6 10/06/2023 1530   MONOABS 0.6 03/22/2024 1620   EOSABS 0.2 03/22/2024 1620   EOSABS 0.3 10/06/2023 1530   BASOSABS 0.1 03/22/2024 1620   BASOSABS 0.1 10/06/2023 1530    BMET    Component Value Date/Time   NA 132 (L) 03/24/2024 0530   NA 136 10/06/2023 1530   K 3.5 03/24/2024 0530   CL 101 03/24/2024 0530   CO2 19 (L) 03/24/2024 0530   GLUCOSE 103 (H) 03/24/2024 0530   BUN 5 (L) 03/24/2024 0530   BUN 16 10/06/2023 1530   CREATININE 0.82 03/24/2024 0530   CALCIUM  8.4 (L) 03/24/2024 0530   GFRNONAA >60 03/24/2024 0530   GFRAA 112 02/16/2018 0939    INR    Component Value Date/Time   INR 1.4 (H) 03/22/2024 1644     Intake/Output Summary (Last 24 hours) at 03/25/2024 1019 Last data filed at 03/25/2024 0200 Gross per 24 hour  Intake 646.23 ml  Output 2350 ml  Net -1703.77 ml     Assessment/Plan:  58 y.o. male is s/p  open revision of left great toe ray amputation with VAC placement   Left foot incision well appearing LLE remains well  perfused and warm  VAC to suction Will need home VAC ordered/ approval and HH RN to perform 3x weekly VAC changes Likely will be here through weekend to get [facility] approval Pain control as needed  Surgical cultures showing GNR/GPC/GPR On Zosyn  and Vanc. Waiting on sensitivities to deescalate/ get on appropriate po abx for d/c Plan will be for [facility] change Tuesday 9/2  Teretha Charlyne RIGGERS [facility] [phone] 03/25/2024 10:19 AM  VASCULAR STAFF ADDENDUM: I have independently interviewed and examined the patient. I agree with the above.   [name]. [name], MD [facility] Phone Number: [phone] 03/25/2024 1:25 PM

## 2024-03-25 NOTE — Care Management (Addendum)
 Reached out to [place] w [employer] [facility] to see if VAC could be available today. Waiting to hear back  VAC [id number] delivered to room
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
## 2024-03-25 NOTE — Plan of Care (Signed)

## 2024-03-26 DIAGNOSIS — M86172 Other acute osteomyelitis, left ankle and foot: Secondary | ICD-10-CM | POA: Diagnosis not present

## 2024-03-26 LAB — CBC WITH DIFFERENTIAL/PLATELET
Abs Immature Granulocytes: 0.05 K/uL (ref 0.00–0.07)
Basophils Absolute: 0 K/uL (ref 0.0–0.1)
Basophils Relative: 1 %
Eosinophils Absolute: 0.2 K/uL (ref 0.0–0.5)
Eosinophils Relative: 3 %
HCT: 26.2 % — ABNORMAL LOW (ref 39.0–52.0)
Hemoglobin: 8.2 g/dL — ABNORMAL LOW (ref 13.0–17.0)
Immature Granulocytes: 1 %
Lymphocytes Relative: 18 %
Lymphs Abs: 1.1 K/uL (ref 0.7–4.0)
MCH: 29.3 pg (ref 26.0–34.0)
MCHC: 31.3 g/dL (ref 30.0–36.0)
MCV: 93.6 fL (ref 80.0–100.0)
Monocytes Absolute: 0.6 K/uL (ref 0.1–1.0)
Monocytes Relative: 11 %
Neutro Abs: 3.9 K/uL (ref 1.7–7.7)
Neutrophils Relative %: 66 %
Platelets: 333 K/uL (ref 150–400)
RBC: 2.8 MIL/uL — ABNORMAL LOW (ref 4.22–5.81)
RDW: 17.8 % — ABNORMAL HIGH (ref 11.5–15.5)
WBC: 5.9 K/uL (ref 4.0–10.5)
nRBC: 0 % (ref 0.0–0.2)

## 2024-03-26 LAB — BASIC METABOLIC PANEL WITH GFR
Anion gap: 12 (ref 5–15)
BUN: 5 mg/dL — ABNORMAL LOW (ref 6–20)
CO2: 21 mmol/L — ABNORMAL LOW (ref 22–32)
Calcium: 8.5 mg/dL — ABNORMAL LOW (ref 8.9–10.3)
Chloride: 107 mmol/L (ref 98–111)
Creatinine, Ser: 0.75 mg/dL (ref 0.61–1.24)
GFR, Estimated: >60 mL/min (ref >60)
Glucose, Bld: 90 mg/dL (ref 70–99)
Potassium: 4.1 mmol/L (ref 3.5–5.1)
Sodium: 140 mmol/L (ref 135–145)

## 2024-03-26 MED ORDER — NICOTINE POLACRILEX 2 MG MT GUM
2.0000 mg | CHEWING_GUM | OROMUCOSAL | 0 refills | Status: DC | PRN
Start: 2024-03-26 — End: 2024-04-09

## 2024-03-26 MED ORDER — CIPROFLOXACIN HCL 500 MG PO TABS: 500.0000 mg | ORAL_TABLET | Freq: Two times a day (BID) | ORAL | 0 refills | Status: DC

## 2024-03-26 MED ORDER — OXYCODONE HCL 5 MG PO TABS: 5.0000 mg | ORAL_TABLET | ORAL | 0 refills | Status: DC | PRN

## 2024-03-26 NOTE — Progress Notes (Signed)
 Physical Therapy Treatment Patient Details Name: Todd [name] MRN: [id number] DOB: 1965/11/23 Today's Date: 03/26/2024   History of Present Illness 58 y.o. male presents to [facility] on 03/22/2024 with concern for wound dehiscence and possible infection at L great toe amputation site. Pt recently admitted earlier this month for CVA, LLE revascularization and great toe amputation and TCAR. Pt underwent revision of L transmetatarsal amputation on 8/29. PMH includes HTN, CVA, PAD, AAA, pulmonary nodule.    PT Comments  Pt progressing well towards all goals. Pt with minimal pain this date and was able to tolerate L darco shoe well. Pt with increased ambulation tolerance and demo'd ability to safely negotiate 3 steps using sideways technique with L handrail leading up with R foot and maintaining L heel WBing only. Acute PT to cont to follow.    If plan is discharge home, recommend the following: Assistance with cooking/housework;Assist for transportation;Help with stairs or ramp for entrance   Can travel by private vehicle        Equipment Recommendations  None recommended by PT (pt declines BSC)    Recommendations for Other Services       Precautions / Restrictions Precautions Precautions: Fall Recall of Precautions/Restrictions: Intact Precaution/Restrictions Comments: wound vac L foot Required Braces or Orthoses: Other Brace Other Brace: darco shoe Restrictions Weight Bearing Restrictions Per Provider Order: Yes LLE Weight Bearing Per Provider Order: Weight bearing as tolerated Other Position/Activity Restrictions: heel WB in [place]     Mobility  Bed Mobility               General bed mobility comments: Pt received in recliner    Transfers Overall transfer level: Needs assistance Equipment used: Rolling walker (2 wheels) Transfers: Sit to/from Stand Sit to Stand: Supervision           General transfer comment: S for safety, good ability to maintain WB  precautions    Ambulation/Gait Ambulation/Gait assistance: Supervision Gait Distance (Feet): 70 Feet Assistive device: Rolling walker (2 wheels) Gait Pattern/deviations: Step-to pattern, Decreased weight shift to left, Decreased step length - right Gait velocity: decreased Gait velocity interpretation: 1.31 - 2.62 ft/sec, indicative of limited community ambulator   General Gait Details: pt able to maintain L heel weightbearing in darco shoe, slow and steady, decreased step height, denies increased pain   Stairs Stairs: Yes Stairs assistance: Contact guard assist Stair Management: Step to pattern, One rail Left, Sideways Number of Stairs: 3 (x2, to mimic home set up) General stair comments: attempted to complete with RW however educated pt on sideways technique and pt reports Oh yeah this is much better   Wheelchair Mobility     Tilt Bed    Modified Rankin (Stroke Patients Only)       Balance Overall balance assessment: Needs assistance Sitting-balance support: No upper extremity supported, Feet supported Sitting balance-Leahy Scale: Good     Standing balance support: Bilateral upper extremity supported, Reliant on assistive device for balance Standing balance-Leahy Scale: Poor Standing balance comment: Can complete standing tasks without UE support, RW for mobility                            Communication Communication Communication: Impaired Factors Affecting Communication: Reduced clarity of speech  Cognition Arousal: Alert Behavior During Therapy: WFL for tasks assessed/performed   PT - Cognitive impairments: No apparent impairments  PT - Cognition Comments: Improved recall of RW use, pt needs some reminders for improved posture resting and while standing. Following commands: Intact Following commands impaired: Follows multi-step commands inconsistently    Cueing Cueing Techniques: Verbal cues  Exercises       General Comments General comments (skin integrity, edema, etc.): VSS on RA, wound vac intact, assisted pt with donning pants, brought wound vac tube and machine up out of top of pants and patient able to wear as a crossbody      Pertinent Vitals/Pain Pain Assessment Pain Assessment: 0-10 Pain Score: 3  Pain Location: L foot Pain Descriptors / Indicators: Sore    Home Living                          Prior Function            PT Goals (current goals can now be found in the care plan section) Acute Rehab PT Goals Patient Stated Goal: to let wound heal, later return to independence PT Goal Formulation: With patient Time For Goal Achievement: 04/07/24 Potential to Achieve Goals: Fair Progress towards PT goals: Progressing toward goals    Frequency    Min 2X/week      PT Plan      Co-evaluation              AM-PAC PT 6 Clicks Mobility   Outcome Measure  Help needed turning from your back to your side while in a flat bed without using bedrails?: None Help needed moving from lying on your back to sitting on the side of a flat bed without using bedrails?: None Help needed moving to and from a bed to a chair (including a wheelchair)?: A Little Help needed standing up from a chair using your arms (e.g., wheelchair or bedside chair)?: A Little Help needed to walk in hospital room?: A Little Help needed climbing 3-5 steps with a railing? : A Little 6 Click Score: 20    End of Session Equipment Utilized During Treatment: Gait belt Activity Tolerance: Patient tolerated treatment well Patient left: in chair;with call bell/phone within reach;with chair alarm set Nurse Communication: Mobility status PT Visit Diagnosis: Other abnormalities of gait and mobility (R26.89);Unsteadiness on feet (R26.81);Pain Pain - Right/Left: Left Pain - part of body: Ankle and joints of foot     Time: 8761-8686 PT Time Calculation (min) (ACUTE ONLY): 35 min  Charges:     $Gait Training: 23-37 mins PT General Charges $$ ACUTE PT VISIT: 1 Visit                     Norene Ames, PT, DPT Acute Rehabilitation Services Secure chat preferred Office #: [phone]    Norene CHRISTELLA Ames 03/26/2024, 1:50 PM

## 2024-03-26 NOTE — Plan of Care (Signed)

## 2024-03-26 NOTE — Progress Notes (Addendum)
 DISCHARGE NOTE HOME Todd [name] to be discharged Home per MD order. Discussed prescriptions and follow up appointments with the patient. Prescriptions given to patient; medication list explained in detail. Patient verbalized understanding.  Skin clean, dry and intact without evidence of skin break down, no evidence of skin tears noted. IV catheter discontinued intact. Site without signs and symptoms of complications. Dressing and pressure applied. Pt denies pain at the site currently. No complaints noted.  Going home with wound vac to left great toe area  See LDA for other incisions and wounds   bilateral groin and left neck  An After Visit Summary (AVS) was printed and given to the patient. Patient escorted via wheelchair, and discharged home via private auto.  [name], RN

## 2024-03-26 NOTE — Discharge Summary (Addendum)
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 evidence of pericardial effusion. Mitral Valve: The mitral valve is normal in structure. No evidence of mitral valve regurgitation. No evidence of mitral valve stenosis. Tricuspid Valve: The tricuspid valve is normal in structure. Tricuspid valve regurgitation is not demonstrated. No evidence of tricuspid stenosis. Aortic Valve: The aortic valve was not well visualized. There is mild calcification of the aortic valve. There is mild thickening of the aortic valve. Aortic valve regurgitation is not visualized. Aortic valve sclerosis is present, with no evidence of aortic valve stenosis. Aortic valve mean gradient measures 3.0 mmHg. Aortic valve peak gradient measures 6.7 mmHg. Aortic valve area, by VTI measures 3.32 cm. Pulmonic Valve: The pulmonic valve was normal in structure. Pulmonic valve regurgitation is not visualized. No evidence of pulmonic stenosis. Aorta: The aortic root is normal in size and structure. Venous: The  inferior vena cava is normal in size with greater than 50% respiratory variability, suggesting right atrial pressure of 3 mmHg. IAS/Shunts: No atrial level shunt detected by color flow Doppler. Additional Comments: 3D was performed not requiring image post processing on an independent workstation and was indeterminate.  LEFT VENTRICLE PLAX 2D LVIDd:         3.80 cm   Diastology LVIDs:         2.60 cm   LV e' medial:    6.99 cm/s LV PW:         1.00 cm   LV E/e' medial:  9.4 LV IVS:        1.00 cm   LV e' lateral:   9.48 cm/s LVOT diam:     2.10 cm   LV E/e' lateral: 7.0 LV SV:         65 LV SV Index:   28 LVOT Area:     3.46 cm  RIGHT VENTRICLE RV Basal diam:  2.70 cm RV Mid diam:    2.10 cm RV S prime:     11.20 cm/s LEFT ATRIUM             Index        RIGHT ATRIUM           Index LA diam:        2.80 cm 1.21 cm/m   RA Area:     16.50 cm LA Vol (A2C):   44.0 ml 19.08 ml/m  RA Volume:   38.00 ml  16.48 ml/m LA Vol (A4C):   18.4 ml 7.98 ml/m LA Biplane Vol: 28.9 ml 12.53 ml/m  AORTIC VALVE AV Area (Vmax):    3.30 cm AV Area (Vmean):   3.16 cm AV Area (VTI):     3.32 cm AV Vmax:  129.00 cm/s AV Vmean:          84.500 cm/s AV VTI:            0.195 m AV Peak Grad:      6.7 mmHg AV Mean Grad:      3.0 mmHg LVOT Vmax:         123.00 cm/s LVOT Vmean:        77.100 cm/s LVOT VTI:          0.187 m LVOT/AV VTI ratio: 0.96 MITRAL VALVE               TRICUSPID VALVE MV Area (PHT): 4.06 cm    TR Peak grad:   4.6 mmHg MV Decel Time: 187 msec    TR Vmax:        107.00 cm/s MV E velocity: 66.00 cm/s MV A velocity: 93.80 cm/s  SHUNTS MV E/A ratio:  0.70        Systemic VTI:  0.19 m                            Systemic Diam: 2.10 cm Maude Emmer MD Electronically signed by Maude Emmer MD Signature Date/Time: 02/28/2024/12:04:17 PM    Final    MR BRAIN WO CONTRAST Result Date: 02/27/2024 EXAM: MRI BRAIN WITHOUT CONTRAST 02/27/2024 05:29:55 PM TECHNIQUE: Multiplanar multisequence MRI of the head/brain was performed  without the administration of intravenous contrast. COMPARISON: Same day CT head and CTA head and neck. CLINICAL HISTORY: Neuro deficit, acute, stroke suspected. FINDINGS: BRAIN AND VENTRICLES: Scattered areas of acute infarct including the left precentral gyrus with areas of infarct adjacent to the region of the right hand motor cortex and additional involvement of the more lateral left precentral gyrus with additional areas of cortical infarct within the left frontal operculum. Acute infarct involving the cortex and subcortical white matter in the left occipital lobe. 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 There are nonspecific hyperintense foci in the subcortical and periventricular white matter that most likely represent chronic microangiopathic ischemic changes in a patient of this age. No intracranial hemorrhage. No mass. No midline shift. No hydrocephalus. The sella is unremarkable. Normal flow voids. ORBITS: No acute abnormality. SINUSES AND MASTOIDS: No acute abnormality. BONES AND SOFT TISSUES: Normal marrow signal. No acute soft tissue abnormality. IMPRESSION: 1. Scattered areas of acute infarct involving the left precentral gyrus including the right hand motor region, lateral left precentral gyrus, left frontal operculum, and left occipital lobe. 2. 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 3. Chronic microangiopathic ischemic changes. Electronically signed by: Donnice Mania MD 02/27/2024 07:47 PM EDT RP Workstation: [id number]   CT ANGIO HEAD NECK W WO CM (CODE STROKE) Result Date: 02/27/2024 EXAM: CTA HEAD AND NECK WITHOUT AND WITH 02/27/2024 02:46:37 PM TECHNIQUE: CTA of the head and neck was performed with and without the administration of intravenous contrast. Multiplanar 2D and/or 3D reformatted images are provided for review. Automated exposure control, iterative reconstruction, and/or weight based adjustment of the mA/kV was utilized to reduce the radiation dose to as low as reasonably  achievable. Stenosis of the internal carotid arteries measured using NASCET criteria. COMPARISON: None available CLINICAL HISTORY: Neuro deficit, acute, stroke suspected; Weakness right side- facial droop. FINDINGS: CTA NECK: AORTIC ARCH AND ARCH VESSELS: Calcific plaque present within the aortic arch and within the origins of the brachiocephalic artery, left common carotid and left subclavian arteries. CERVICAL CAROTID ARTERIES: Moderate calcific plaque within  the right carotid bulb and origin of the right internal carotid artery with luminal irregularity and approximately 50% luminal stenosis. The remainder of the cervical segment is normal in caliber. Moderate calcific and uncalcific plaque within the origin of the left internal carotid artery with approximately 50 to 60% luminal stenosis. CERVICAL VERTEBRAL ARTERIES: The vertebral arteries are patent. The left vertebral artery is dominant. LUNGS AND MEDIASTINUM: The lung apices are mildly emphysematous. SOFT TISSUES: No acute abnormality. BONES: No acute abnormality. CTA HEAD: ANTERIOR CIRCULATION: Moderate calcific plaque present within the petrous and cavernous segments of the internal carotid arteries bilaterally. Estimated stenosis of the cavernous segments is 40 to 50%. The supraclinoid segments are patent. The anterior and middle cerebral arteries and the proximal branches appear normal in caliber. POSTERIOR CIRCULATION: The vertebral basilar system is unremarkable. There is a right posterior communicating artery. The left posterior communicating artery is either diminutive or absent. The posterior cerebral arteries are normal in caliber. OTHER: No dural venous sinus thrombosis on this non-dedicated study. The above findings were communicated with the ordering clinician, Dr. Gloria, at 02:59 pm Feb 26 2001 via the [facility] paging. IMPRESSION: 1. Moderate calcific plaque within the right carotid bulb and origin of the right internal carotid artery with luminal  irregularity and approximately 50% luminal stenosis. 2. Moderate calcific and uncalcific plaque within the origin of the left internal carotid artery with approximately 50 to 60% luminal stenosis. 3. Moderate calcific plaque within the petrous and cavernous segments of the internal carotid arteries bilaterally, with estimated stenosis of the cavernous segments of 40 to 50%. 4. Findings communicated with the ordering clinician, Dr. Merrianne, at 02:59 PM on 02/27/2024 via the [facility] paging system. Electronically signed by: evalene coho 02/27/2024 03:01 PM EDT RP Workstation: [id number]   CT HEAD CODE STROKE WO CONTRAST Result Date: 02/27/2024 EXAM: CT HEAD WITHOUT CONTRAST 02/27/2024 02:36:32 PM TECHNIQUE: CT of the head was performed without the administration of intravenous contrast. Automated exposure control, iterative reconstruction, and/or weight based adjustment of the mA/kV was utilized to reduce the radiation dose to as low as reasonably achievable. COMPARISON: CT of the head dated 02/20/2024. CLINICAL HISTORY: Neuro deficit, acute, stroke suspected. Weakness- right side. Facial droop. Last normal 12P. Dr. Lindzen [phone]. FINDINGS: BRAIN AND VENTRICLES: No acute hemorrhage. Gray-white differentiation is preserved. No hydrocephalus. No extra-axial collection. No mass effect or midline shift. Chronic encephalomalacia changes in the left occipital lobe. Chronic lacunar infarct is present within the right basal ganglia. ASPECT score is 10. ORBITS: No acute abnormality. SINUSES: No acute abnormality. SOFT TISSUES AND SKULL: No acute soft tissue abnormality. No skull fracture. The above findings were communicated to Dr. Lindzen at 02:41 PM on 02/27/2024 via the [facility] paging service. IMPRESSION: 1. No acute intracranial abnormality. 2. Chronic encephalomalacia changes in the left occipital lobe and chronic lacunar infarct in the right basal ganglia. Electronically signed by: evalene coho 02/27/2024  02:44 PM EDT RP Workstation: [id number]    Microbiology: Results for orders placed or performed during the hospital encounter of 03/22/24  Blood culture (routine x 2)     Status: None (Preliminary result)   Collection Time: 03/22/24  5:00 PM   Specimen: BLOOD RIGHT HAND  Result Value Ref Range Status   Specimen Description BLOOD RIGHT HAND  Final   Special Requests   Final    BOTTLES DRAWN AEROBIC AND ANAEROBIC Blood Culture adequate volume   Culture   Final    NO GROWTH 4 DAYS Performed at [facility]  Lab, [address], [place], [place] [address]    Report Status PENDING  Incomplete  Blood culture (routine x 2)     Status: None (Preliminary result)   Collection Time: 03/23/24  7:17 AM   Specimen: BLOOD  Result Value Ref Range Status   Specimen Description BLOOD BLOOD LEFT HAND  Final   Special Requests   Final    BOTTLES DRAWN AEROBIC AND ANAEROBIC Blood Culture results may not be optimal due to an inadequate volume of blood received in culture bottles   Culture   Final    NO GROWTH 3 DAYS Performed at [facility], [address], [place], [place] [address]    Report Status PENDING  Incomplete  Aerobic/Anaerobic Culture w Gram Stain (surgical/deep wound)     Status: None (Preliminary result)   Collection Time: 03/23/24 11:33 AM   Specimen: Path fluid; Body Fluid  Result Value Ref Range Status   Specimen Description FLUID  Final   Special Requests LEFT GREAT TOE  Final   Gram Stain   Final    NO WBC SEEN ABUNDANT GRAM NEGATIVE RODS ABUNDANT GRAM POSITIVE COCCI IN CHAINS MODERATE GRAM POSITIVE RODS    Culture   Final    FEW PSEUDOMONAS AERUGINOSA CULTURE REINCUBATED FOR BETTER GROWTH HOLDING FOR POSSIBLE ANAEROBE Performed at [facility], [address] [place], [place] [address]    Report Status PENDING  Incomplete   Organism ID, Bacteria PSEUDOMONAS AERUGINOSA  Final      Susceptibility   Pseudomonas aeruginosa - MIC*    MEROPENEM <=0.25  SENSITIVE Sensitive     CIPROFLOXACIN  0.25 SENSITIVE Sensitive     IMIPENEM 2 SENSITIVE Sensitive     PIP/TAZO Value in next row Sensitive ug/mL     16 SENSITIVEThis is a modified FDA-approved test that has been validated and its performance characteristics determined by the reporting laboratory.  This laboratory is certified under the Clinical Laboratory Improvement Amendments CLIA as qualified to perform high complexity clinical laboratory testing.    CEFEPIME Value in next row Sensitive      16 SENSITIVEThis is a modified FDA-approved test that has been validated and its performance characteristics determined by the reporting laboratory.  This laboratory is certified under the Clinical Laboratory Improvement Amendments CLIA as qualified to perform high complexity clinical laboratory testing.    CEFTAZIDIME/AVIBACTAM Value in next row Sensitive ug/mL     16 SENSITIVEThis is a modified FDA-approved test that has been validated and its performance characteristics determined by the reporting laboratory.  This laboratory is certified under the Clinical Laboratory Improvement Amendments CLIA as qualified to perform high complexity clinical laboratory testing.    CEFTOLOZANE/TAZOBACTAM Value in next row Sensitive ug/mL     16 SENSITIVEThis is a modified FDA-approved test that has been validated and its performance characteristics determined by the reporting laboratory.  This laboratory is certified under the Clinical Laboratory Improvement Amendments CLIA as qualified to perform high complexity clinical laboratory testing.    TOBRAMYCIN Value in next row Sensitive      16 SENSITIVEThis is a modified FDA-approved test that has been validated and its performance characteristics determined by the reporting laboratory.  This laboratory is certified under the Clinical Laboratory Improvement Amendments CLIA as qualified to perform high complexity clinical laboratory testing.    CEFTAZIDIME Value in next row  Sensitive      16 SENSITIVEThis is a modified FDA-approved test that has been validated and its performance characteristics determined by  the reporting laboratory.  This laboratory is certified under the Clinical Laboratory Improvement Amendments CLIA as qualified to perform high complexity clinical laboratory testing.    * FEW PSEUDOMONAS AERUGINOSA    Labs: CBC: Recent Labs  Lab 03/22/24 1620 03/23/24 0717 03/24/24 0530 03/25/24 1105 03/26/24 0430  WBC 6.6 8.1 7.4 5.5 5.9  NEUTROABS 4.1  --   --  3.8 3.9  HGB 9.6* 8.9* 8.3* 7.3* 8.2*  HCT 30.6* 28.5* 26.3* 23.5* 26.2*  MCV 94.2 94.4 92.9 94.4 93.6  PLT 560* 467* 369 294 333   Basic Metabolic Panel: Recent Labs  Lab 03/22/24 1620 03/23/24 0717 03/24/24 0530 03/25/24 1105 03/26/24 0430  NA 138 136 132* 136 140  K 3.5 3.6 3.5 2.9* 4.1  CL 105 106 101 105 107  CO2 20* 19* 19* 22 21*  GLUCOSE 93 83 103* 148* 90  BUN 13 9 5* 5* 5*  CREATININE 0.72 0.73 0.82 0.83 0.75  CALCIUM  9.4 8.8* 8.4* 8.1* 8.5*  MG  --  1.8 1.7  --   --   PHOS  --  3.8  --   --   --    Liver Function Tests: Recent Labs  Lab 03/22/24 1620  AST 25  ALT 31  ALKPHOS 135*  BILITOT 0.6  PROT 7.9  ALBUMIN  3.3*   CBG: No results for input(s): GLUCAP in the last 168 hours.  Discharge time spent: greater than 30 minutes.  Signed: Delon Herald, MD [facility] 03/26/2024

## 2024-03-26 NOTE — Progress Notes (Signed)
 Wound vac changed to home unit  patient education completed

## 2024-03-26 NOTE — Progress Notes (Signed)
 [facility] [place] PROGRESS NOTE  ASSESSMENT / PLAN: Todd [name] is a 58 y.o. 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  Currently managed with small VAC and antibiotics.  Safe for discharge from our standpoint.  Patient is hopeful to get a knee scooter to help him with mobility.  This is fine for me and I did put in an order for DME delivery if we have this available at [facility].  He will follow-up with Dr. Sheree in about 1 to 2 weeks.  SUBJECTIVE: Feeling better today.  Wants to go home.  Inquiring about a knee scooter.  OBJECTIVE: BP 126/84 (BP Location: Left Arm)   Pulse 92   Temp 97.8 F (36.6 C)   Resp 17   Ht 6' 2 (1.88 m)   Wt 89.8 kg   SpO2 100%   BMI 25.42 kg/m   Intake/Output Summary (Last 24 hours) at 03/26/2024 1029 Last data filed at 03/25/2024 2345 Gross per 24 hour  Intake --  Output 700 ml  Net -700 ml    Doing well overall Regular rate and rhythm Unlabored breathing Foot is warm and well-perfused Left great toe VAC in place incision line appears intact     Latest Ref Rng & Units 03/26/2024    4:30 AM 03/25/2024   11:05 AM 03/24/2024    5:30 AM  CBC  WBC 4.0 - 10.5 K/uL 5.9  5.5  7.4   Hemoglobin 13.0 - 17.0 g/dL 8.2  7.3  8.3   Hematocrit 39.0 - 52.0 % 26.2  23.5  26.3   Platelets 150 - 400 K/uL 333  294  369         Latest Ref Rng & Units 03/26/2024    4:30 AM 03/25/2024   11:05 AM 03/24/2024    5:30 AM  CMP  Glucose 70 - 99 mg/dL 90  851  896   BUN 6 - 20 mg/dL 5  5  5    Creatinine 0.61 - 1.24 mg/dL 9.24  9.16  9.17   Sodium 135 - 145 mmol/L 140  136  132   Potassium 3.5 - 5.1 mmol/L 4.1  2.9  3.5   Chloride 98 - 111 mmol/L 107  105  101   CO2 22 - 32 mmol/L 21  22  19    Calcium  8.9 - 10.3 mg/dL 8.5  8.1  8.4     Estimated Creatinine Clearance: 115.6 mL/min (by C-G formula based on SCr of 0.75 mg/dL).  [name]. [name], MD [facility]  [facility] Phone Number: [phone] 03/26/2024 10:29 AM

## 2024-03-27 ENCOUNTER — Telehealth: Payer: Self-pay

## 2024-03-27 ENCOUNTER — Encounter (HOSPITAL_COMMUNITY): Payer: Self-pay | Admitting: Vascular Surgery

## 2024-03-27 LAB — AEROBIC/ANAEROBIC CULTURE W GRAM STAIN (SURGICAL/DEEP WOUND): Gram Stain: NONE SEEN

## 2024-03-27 LAB — CULTURE, BLOOD (ROUTINE X 2)
Culture: NO GROWTH
Special Requests: ADEQUATE

## 2024-03-27 NOTE — Telephone Encounter (Signed)
 Todd [name] with [facility] called to let us  know pt's wound had gotten wet with wound vac on and the skin is severely macerated. She is unable to reapply wound vac due to the compromised skin. No signs of infection present. She was given VO to do wet-dry dressing until the skin is healed. She will teach his wife to do this. Todd [name] is due to return later this week and will update us  on the condition of the wound.

## 2024-03-27 NOTE — Transitions of Care (Post Inpatient/ED Visit) (Signed)
 03/27/2024  Name: Todd [name] MRN: [id number] DOB: [birth year]  Today's TOC FU Call Status: Today's TOC FU Call Status:: Successful TOC FU Call Completed TOC FU Call Complete Date: 03/27/24 Patient's Name and Date of Birth confirmed.  Transition Care Management Follow-up Telephone Call Date of Discharge: 03/26/24 Discharge Facility: [facility]) Type of Discharge: Inpatient Admission Primary Inpatient Discharge Diagnosis:: osteomyelitis How have you been since you were released from the hospital?: Better Any questions or concerns?: No  Items Reviewed: Did you receive and understand the discharge instructions provided?: Yes Medications obtained,verified, and reconciled?: Yes (Medications Reviewed) (he has all of his medications and he did not have any questions about the med regime) Any new allergies since your discharge?: No Dietary orders reviewed?: Yes Type of Diet Ordered:: heart healthy, low sodium Do you have support at home?: Yes People in Home [RPT]: spouse Name of Support/Comfort Primary Source: his wife  Medications Reviewed Today: Medications Reviewed Today     Reviewed by Marvis Bradley, RN (Case Manager) on 03/27/24 at 1633  Med List Status: <None>   Medication Order Taking? Sig Documenting Provider Last Dose Status Informant  acetaminophen  (TYLENOL ) 325 MG tablet [id number] No Take 2 tablets (650 mg total) by mouth every 6 (six) hours as needed for mild pain (pain score 1-3) or fever (or Fever >/= 101). Gonfa, Taye T, MD 03/22/2024 Noon Active Self, Pharmacy Records  aspirin  EC 81 MG tablet [id number] No Take 1 tablet (81 mg total) by mouth daily. Swallow whole. Gonfa, Taye T, MD 03/22/2024 Morning Active Self, Pharmacy Records  atorvastatin  (LIPITOR) 40 MG tablet [id number] No Take 1 tablet (40 mg total) by mouth daily. Gonfa, Taye T, MD 03/22/2024 Morning Active Self, Pharmacy Records  ciprofloxacin  (CIPRO ) 500 MG tablet [id number]  Take 1 tablet (500 mg total) by mouth  2 (two) times daily for 10 days. Barbarann Nest, MD  Active   clopidogrel  (PLAVIX ) 75 MG tablet [id number] No Take 1 tablet (75 mg total) by mouth daily. Gonfa, Taye T, MD 03/22/2024 Morning Active Self, Pharmacy Records  gabapentin  (NEURONTIN ) 300 MG capsule [id number] No Take 2 capsules (600 mg total) by mouth 3 (three) times daily.  Patient taking differently: Take 300 mg by mouth 3 (three) times daily.   Gonfa, Taye T, MD 03/22/2024 Noon Active Self, Pharmacy Records           Med Note [facility], DONETA GORMAN Schaumann Mar 22, 2024 11:02 PM) Patient stated that medication isn't effective, even when he took 2 capsules  nicotine  (NICODERM CQ  - DOSED IN MG/24 HOURS) 21 mg/24hr patch [id number] No Place 1 patch (21 mg total) onto the skin daily. Gonfa, Taye T, MD 03/22/2024 Morning Active Self, Pharmacy Records  nicotine  polacrilex (NICORETTE ) 2 MG gum [id number]  Take 1 each (2 mg total) by mouth as needed for smoking cessation. Barbarann Nest, MD  Active   oxyCODONE  (OXY IR/ROXICODONE ) 5 MG immediate release tablet [id number]  Take 1 tablet (5 mg total) by mouth every 4 (four) hours as needed for moderate pain (pain score 4-6). Barbarann Nest, MD  Active             Home Care and Equipment/Supplies: Were Home Health Services Ordered?: Yes Name of Home Health Agency:: [facility] Has Agency set up a time to come to your home?: Yes First Home Health Visit Date: 03/27/24 (the nurse was out today to change his wound VAC) Any new equipment or medical supplies ordered?: Yes Name of Medical supply  agency?: the home health nurse gave him supplies to do wet to dry dressings on his foot until she is able to re-apply the VAC.  he said she tried to appl it today but it would not adhere. He said he got his foot wet and that was the problem. He went on to state that the nurse will be back out to see him on Sat, 03/31/2024 and will attempt to re-apply the VAC.  He said his wife will be doing the dressing changes for him. Were  you able to get the equipment/medical supplies?: Yes Do you have any questions related to the use of the equipment/supplies?: No  Functional Questionnaire: Do you need assistance with bathing/showering or dressing?: Yes (His wife assists as needed) Do you need assistance with meal preparation?: Yes (His wife prepares his meals) Do you need assistance with eating?: No Do you have difficulty maintaining continence: No Do you need assistance with getting out of bed/getting out of a chair/moving?: Yes (ambulating with his walker and he has a surgical shoe for his left foot and is only to bear weight on his left heel.  he said that his insurance company denied the knee scooter) Do you have difficulty managing or taking your medications?: Yes (his wife assists)  Follow up appointments reviewed: PCP Follow-up appointment confirmed?: Yes Date of PCP follow-up appointment?: 04/10/24 Follow-up Provider: Dr Tanda. Specialist Hospital Follow-up appointment confirmed?: Yes Date of Specialist follow-up appointment?: 04/16/24 Follow-Up Specialty Provider:: neurology.    04/18/2024- [facility] Do you need transportation to your follow-up appointment?: No Do you understand care options if your condition(s) worsen?: Yes-patient verbalized understanding    SIGNATURE Slater Diesel, RN

## 2024-03-28 LAB — CULTURE, BLOOD (ROUTINE X 2): Culture: NO GROWTH

## 2024-03-30 ENCOUNTER — Telehealth: Payer: Self-pay

## 2024-03-30 NOTE — Telephone Encounter (Addendum)
 Leonor, RN [facility]  called to report loose stitches of pt's left great toe amputation.   Leonor reported tissue is black,  necrotic and severely macerated.  Patient taking abx as prescribed.   Leonor reported no signs of infection.  Consulted [place], [place].  Matt advised to clean daily with anti-bacterial soap and water and dress with dry dressing.   Will schedule an appt in the next week or so for wound check.  04/02/24 Wound check appt scheduled for 04/04/24 with Dr. Sheree Leonor called back to report status of wound.  She reported that the wound is essentially unchanged although inside the wound is dusky and grey.  Advised to continue wound care as described above and will see patient on 04/04/24.

## 2024-04-02 NOTE — Addendum Note (Signed)
 Addendum  created 04/02/24 1049 by Richarda Silvano HERO, CRNA   Clinical Note Signed

## 2024-04-02 NOTE — Progress Notes (Signed)
 Received pt's short term disability forms from [employer]. Called pt to collect payment and he states he is approved through his insurance until 04/23/24. He does not want these forms filled out at this time and is aware to call us  if anything changes.

## 2024-04-03 ENCOUNTER — Other Ambulatory Visit: Payer: Self-pay | Admitting: Family Medicine

## 2024-04-04 ENCOUNTER — Ambulatory Visit: Admitting: Vascular Surgery

## 2024-04-04 ENCOUNTER — Encounter: Payer: Self-pay | Admitting: Vascular Surgery

## 2024-04-04 ENCOUNTER — Other Ambulatory Visit: Payer: Self-pay | Admitting: Vascular Surgery

## 2024-04-04 ENCOUNTER — Ambulatory Visit (HOSPITAL_COMMUNITY)
Admission: RE | Admit: 2024-04-04 | Discharge: 2024-04-04 | Disposition: A | Discharge status: Home / Self Care | Source: Ambulatory Visit | Attending: Cardiovascular Disease | Admitting: Cardiovascular Disease

## 2024-04-04 ENCOUNTER — Ambulatory Visit (INDEPENDENT_AMBULATORY_CARE_PROVIDER_SITE_OTHER): Admitting: Vascular Surgery

## 2024-04-04 ENCOUNTER — Other Ambulatory Visit: Payer: Self-pay

## 2024-04-04 ENCOUNTER — Encounter (HOSPITAL_COMMUNITY): Payer: Self-pay | Admitting: Vascular Surgery

## 2024-04-04 VITALS — BP 144/89 | HR 106 | Temp 98.0°F

## 2024-04-04 DIAGNOSIS — I7143 Infrarenal abdominal aortic aneurysm, without rupture: Secondary | ICD-10-CM | POA: Insufficient documentation

## 2024-04-04 DIAGNOSIS — I739 Peripheral vascular disease, unspecified: Secondary | ICD-10-CM | POA: Insufficient documentation

## 2024-04-04 DIAGNOSIS — T8142XA Infection following a procedure, deep incisional surgical site, initial encounter: Secondary | ICD-10-CM

## 2024-04-04 MED ORDER — OXYCODONE HCL 5 MG PO TABS: 5.0000 mg | ORAL_TABLET | ORAL | 0 refills | Status: DC | PRN

## 2024-04-04 MED ORDER — IOHEXOL 350 MG/ML SOLN
100.0000 mL | Freq: Once | INTRAVENOUS | Status: AC | PRN
  Administered 2024-04-04: 100 mL via INTRAVENOUS

## 2024-04-04 NOTE — Progress Notes (Signed)
     Subjective:     Patient ID: Todd [name], male   DOB: [birth year], 58 y.o.   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  At that time he underwent left great toe amputation and underwent subsequent transmetatarsal left great toe amputation.  He is now here with concerns of nonhealing.   Review of Systems Nonhealing left toe potation wound    Objective:   Physical Exam Vitals:   04/04/24 1027  BP: (!) 144/89  Pulse: (!) 106  Temp: 98 F (36.7 C)  SpO2: 99%   Left great toe amputation wound nonhealing with dehisced sutures    Assessment:     58 year old male with extensive left lower extremity vascular history as above dehisced left great toe transmetatarsal amputation site    Plan:     Will plan operative intervention tomorrow with debridement and wound VAC placement.  We discussed that he is at high risk for limb loss and he demonstrates good understanding.    [name], MD [facility] [place] Office: [phone] Pager: [phone]

## 2024-04-04 NOTE — H&P (View-Only) (Signed)
     Subjective:     Patient ID: Todd [name], male   DOB: [birth year], 58 y.o.   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  At that time he underwent left great toe amputation and underwent subsequent transmetatarsal left great toe amputation.  He is now here with concerns of nonhealing.   Review of Systems Nonhealing left toe potation wound    Objective:   Physical Exam Vitals:   04/04/24 1027  BP: (!) 144/89  Pulse: (!) 106  Temp: 98 F (36.7 C)  SpO2: 99%   Left great toe amputation wound nonhealing with dehisced sutures    Assessment:     58 year old male with extensive left lower extremity vascular history as above dehisced left great toe transmetatarsal amputation site    Plan:     Will plan operative intervention tomorrow with debridement and wound VAC placement.  We discussed that he is at high risk for limb loss and he demonstrates good understanding.    [name], MD [facility] Office: [phone] Pager: [phone]

## 2024-04-04 NOTE — Progress Notes (Signed)
 SDW call  Patient was given pre-op instructions over the phone. Patient verbalized understanding of instructions provided.     PCP - Dr. Raguel Blush Cardiologist -  Pulmonary:    PPM/ICD - denies Device Orders - na Rep Notified - na   Chest x-ray - 03/06/2024 EKG -  02/29/2024 Stress Test - ECHO - 02/28/2024 Cardiac Cath -  TCAR: 03/06/2024  Sleep Study/sleep apnea/CPAP: denies  Non-diabetic  Blood Thinner Instructions: Plavix , hold 9/10 and 9/11 Aspirin  Instructions: continue   ERAS Protcol - NPO   Anesthesia review: Yes: TCAR,  AAA stent graft, HTN, HLD   Patient denies shortness of breath, fever, cough and chest pain over the phone call  Your procedure is scheduled on Thursday April 05, 2024  Report to [facility] Main Entrance A at  0725  A.M., then check in with the Admitting office.  Call this number if you have problems the morning of surgery:  [phone]   If you have any questions prior to your surgery date call [phone]: Open Monday-Friday 8am-4pm If you experience any cold or flu symptoms such as cough, fever, chills, shortness of breath, etc. between now and your scheduled surgery, please notify us  at the above number    Remember:  Do not eat or drink after midnight the night before your surgery  Take these medicines the morning of surgery with A SIP OF WATER:  ASA, atorvastatin , cipro , gabapentin   As needed: Tylenol , oxycodone   As of today, STOP taking any Aleve , Naproxen , Ibuprofen , Motrin , Advil , Goody's, BC's, all herbal medications, fish oil, and all vitamins.

## 2024-04-05 ENCOUNTER — Inpatient Hospital Stay (HOSPITAL_COMMUNITY)
Admission: RE | Admit: 2024-04-05 | Discharge: 2024-04-09 | DRG: 464 | Disposition: A | Discharge status: Home / Self Care | Attending: Vascular Surgery | Admitting: Vascular Surgery

## 2024-04-05 ENCOUNTER — Inpatient Hospital Stay (HOSPITAL_COMMUNITY): Payer: Self-pay

## 2024-04-05 ENCOUNTER — Other Ambulatory Visit: Payer: Self-pay

## 2024-04-05 ENCOUNTER — Encounter (HOSPITAL_COMMUNITY)
Admission: RE | Disposition: A | Discharge status: Home / Self Care | Payer: Self-pay | Source: Home / Self Care | Attending: Vascular Surgery

## 2024-04-05 ENCOUNTER — Encounter (HOSPITAL_COMMUNITY): Payer: Self-pay | Admitting: Vascular Surgery

## 2024-04-05 DIAGNOSIS — Z8673 Personal history of transient ischemic attack (TIA), and cerebral infarction without residual deficits: Secondary | ICD-10-CM

## 2024-04-05 DIAGNOSIS — I739 Peripheral vascular disease, unspecified: Principal | ICD-10-CM | POA: Diagnosis present

## 2024-04-05 DIAGNOSIS — E785 Hyperlipidemia, unspecified: Secondary | ICD-10-CM | POA: Diagnosis present

## 2024-04-05 DIAGNOSIS — I10 Essential (primary) hypertension: Secondary | ICD-10-CM | POA: Diagnosis present

## 2024-04-05 DIAGNOSIS — T8781 Dehiscence of amputation stump: Secondary | ICD-10-CM | POA: Diagnosis not present

## 2024-04-05 DIAGNOSIS — T8142XA Infection following a procedure, deep incisional surgical site, initial encounter: Secondary | ICD-10-CM

## 2024-04-05 DIAGNOSIS — T8744 Infection of amputation stump, left lower extremity: Principal | ICD-10-CM | POA: Diagnosis present

## 2024-04-05 DIAGNOSIS — Z720 Tobacco use: Secondary | ICD-10-CM

## 2024-04-05 DIAGNOSIS — I7143 Infrarenal abdominal aortic aneurysm, without rupture: Secondary | ICD-10-CM | POA: Diagnosis present

## 2024-04-05 DIAGNOSIS — Y835 Amputation of limb(s) as the cause of abnormal reaction of the patient, or of later complication, without mention of misadventure at the time of the procedure: Secondary | ICD-10-CM | POA: Diagnosis present

## 2024-04-05 DIAGNOSIS — Z89412 Acquired absence of left great toe: Secondary | ICD-10-CM

## 2024-04-05 DIAGNOSIS — D62 Acute posthemorrhagic anemia: Secondary | ICD-10-CM | POA: Diagnosis not present

## 2024-04-05 HISTORY — PX: APPLICATION OF WOUND VAC: SHX5189

## 2024-04-05 HISTORY — DX: Cerebral infarction, unspecified: I63.9

## 2024-04-05 HISTORY — PX: APPLICATION, SKIN SUBSTITUTE: SHX7530

## 2024-04-05 HISTORY — PX: INCISION AND DRAINAGE OF WOUND: SHX1803

## 2024-04-05 LAB — BASIC METABOLIC PANEL WITH GFR
Anion gap: 12 (ref 5–15)
BUN: 11 mg/dL (ref 6–20)
CO2: 24 mmol/L (ref 22–32)
Calcium: 9.2 mg/dL (ref 8.9–10.3)
Chloride: 102 mmol/L (ref 98–111)
Creatinine, Ser: 0.86 mg/dL (ref 0.61–1.24)
GFR, Estimated: >60 mL/min (ref >60)
Glucose, Bld: 102 mg/dL — ABNORMAL HIGH (ref 70–99)
Potassium: 3.4 mmol/L — ABNORMAL LOW (ref 3.5–5.1)
Sodium: 138 mmol/L (ref 135–145)

## 2024-04-05 LAB — CBC
HCT: 27.1 % — ABNORMAL LOW (ref 39.0–52.0)
Hemoglobin: 8.7 g/dL — ABNORMAL LOW (ref 13.0–17.0)
MCH: 30.1 pg (ref 26.0–34.0)
MCHC: 32.1 g/dL (ref 30.0–36.0)
MCV: 93.8 fL (ref 80.0–100.0)
Platelets: 275 K/uL (ref 150–400)
RBC: 2.89 MIL/uL — ABNORMAL LOW (ref 4.22–5.81)
RDW: 18.1 % — ABNORMAL HIGH (ref 11.5–15.5)
WBC: 5.5 K/uL (ref 4.0–10.5)
nRBC: 0 % (ref 0.0–0.2)

## 2024-04-05 SURGERY — IRRIGATION AND DEBRIDEMENT WOUND
Anesthesia: General | Site: Foot | Laterality: Left

## 2024-04-05 MED ORDER — DEXAMETHASONE SODIUM PHOSPHATE 10 MG/ML IJ SOLN
INTRAMUSCULAR | Status: AC
  Filled 2024-04-05: qty 1

## 2024-04-05 MED ORDER — MIDAZOLAM HCL 2 MG/2ML IJ SOLN
INTRAMUSCULAR | Status: DC | PRN
Start: 2024-04-05 — End: 2024-04-05
  Administered 2024-04-05: 2 mg via INTRAVENOUS

## 2024-04-05 MED ORDER — FENTANYL CITRATE (PF) 250 MCG/5ML IJ SOLN
INTRAMUSCULAR | Status: AC
  Filled 2024-04-05: qty 5

## 2024-04-05 MED ORDER — MORPHINE SULFATE (PF) 2 MG/ML IV SOLN
2.0000 mg | INTRAVENOUS | Status: DC | PRN
  Administered 2024-04-05 – 2024-04-06 (×3): 2 mg via INTRAVENOUS
  Filled 2024-04-05 (×3): qty 1

## 2024-04-05 MED ORDER — ASPIRIN 81 MG PO TBEC
81.0000 mg | DELAYED_RELEASE_TABLET | Freq: Every day | ORAL | Status: DC
  Administered 2024-04-06 – 2024-04-09 (×4): 81 mg via ORAL
  Filled 2024-04-05 (×4): qty 1

## 2024-04-05 MED ORDER — OXYCODONE HCL 5 MG PO TABS
ORAL_TABLET | ORAL | Status: AC
  Filled 2024-04-05: qty 1

## 2024-04-05 MED ORDER — LIDOCAINE-EPINEPHRINE (PF) 1 %-1:200000 IJ SOLN
INTRAMUSCULAR | Status: AC
Start: 2024-04-05 — End: 2024-04-05
  Filled 2024-04-05: qty 30

## 2024-04-05 MED ORDER — NICOTINE 21 MG/24HR TD PT24
21.0000 mg | MEDICATED_PATCH | Freq: Every day | TRANSDERMAL | Status: DC
  Administered 2024-04-06 – 2024-04-09 (×4): 21 mg via TRANSDERMAL
  Filled 2024-04-05 (×4): qty 1

## 2024-04-05 MED ORDER — HYDROMORPHONE HCL 1 MG/ML IJ SOLN
INTRAMUSCULAR | Status: AC
  Filled 2024-04-05: qty 0.5

## 2024-04-05 MED ORDER — FENTANYL CITRATE (PF) 100 MCG/2ML IJ SOLN
25.0000 ug | INTRAMUSCULAR | Status: DC | PRN
  Administered 2024-04-05 (×3): 50 ug via INTRAVENOUS

## 2024-04-05 MED ORDER — LIDOCAINE 2% (20 MG/ML) 5 ML SYRINGE
INTRAMUSCULAR | Status: AC
  Filled 2024-04-05: qty 5

## 2024-04-05 MED ORDER — ACETAMINOPHEN 325 MG PO TABS
650.0000 mg | ORAL_TABLET | Freq: Four times a day (QID) | ORAL | Status: DC
  Administered 2024-04-05 – 2024-04-09 (×16): 650 mg via ORAL
  Filled 2024-04-05 (×16): qty 2

## 2024-04-05 MED ORDER — 0.9 % SODIUM CHLORIDE (POUR BTL) OPTIME
TOPICAL | Status: DC | PRN
  Administered 2024-04-05: 1000 mL

## 2024-04-05 MED ORDER — CHLORHEXIDINE GLUCONATE 0.12 % MT SOLN
OROMUCOSAL | Status: AC
  Filled 2024-04-05: qty 15

## 2024-04-05 MED ORDER — SODIUM CHLORIDE 0.9 % IV SOLN
INTRAVENOUS | Status: DC
Start: 2024-04-05 — End: 2024-04-05

## 2024-04-05 MED ORDER — CHLORHEXIDINE GLUCONATE 4 % EX SOLN: 60.0000 mL | Freq: Once | CUTANEOUS | Status: DC

## 2024-04-05 MED ORDER — ACETAMINOPHEN 10 MG/ML IV SOLN
1000.0000 mg | Freq: Once | INTRAVENOUS | Status: DC | PRN
  Administered 2024-04-05: 1000 mg via INTRAVENOUS

## 2024-04-05 MED ORDER — PROPOFOL 10 MG/ML IV BOLUS
INTRAVENOUS | Status: DC | PRN
  Administered 2024-04-05: 50 mg via INTRAVENOUS
  Administered 2024-04-05: 150 mg via INTRAVENOUS

## 2024-04-05 MED ORDER — PHENYLEPHRINE 80 MCG/ML (10ML) SYRINGE FOR IV PUSH (FOR BLOOD PRESSURE SUPPORT)
PREFILLED_SYRINGE | INTRAVENOUS | Status: DC | PRN
Start: 2024-04-05 — End: 2024-04-05
  Administered 2024-04-05: 120 ug via INTRAVENOUS

## 2024-04-05 MED ORDER — LABETALOL HCL 5 MG/ML IV SOLN: 10.0000 mg | INTRAVENOUS | Status: DC | PRN

## 2024-04-05 MED ORDER — MIDAZOLAM HCL 2 MG/2ML IJ SOLN
INTRAMUSCULAR | Status: AC
  Filled 2024-04-05: qty 2

## 2024-04-05 MED ORDER — PHENYLEPHRINE 80 MCG/ML (10ML) SYRINGE FOR IV PUSH (FOR BLOOD PRESSURE SUPPORT)
PREFILLED_SYRINGE | INTRAVENOUS | Status: AC
Start: 2024-04-05 — End: 2024-04-05
  Filled 2024-04-05: qty 10

## 2024-04-05 MED ORDER — FENTANYL CITRATE (PF) 100 MCG/2ML IJ SOLN
INTRAMUSCULAR | Status: AC
  Filled 2024-04-05: qty 2

## 2024-04-05 MED ORDER — DROPERIDOL 2.5 MG/ML IJ SOLN: 0.6250 mg | Freq: Once | INTRAMUSCULAR | Status: DC | PRN

## 2024-04-05 MED ORDER — LIDOCAINE 2% (20 MG/ML) 5 ML SYRINGE
INTRAMUSCULAR | Status: DC | PRN
Start: 2024-04-05 — End: 2024-04-05
  Administered 2024-04-05: 100 mg via INTRAVENOUS

## 2024-04-05 MED ORDER — ACETAMINOPHEN 325 MG PO TABS
650.0000 mg | ORAL_TABLET | ORAL | Status: DC | PRN
  Administered 2024-04-06: 650 mg via ORAL

## 2024-04-05 MED ORDER — MORPHINE SULFATE (PF) 4 MG/ML IV SOLN: 4.0000 mg | INTRAVENOUS | Status: DC | PRN

## 2024-04-05 MED ORDER — SODIUM CHLORIDE 0.9% FLUSH: 3.0000 mL | INTRAVENOUS | Status: DC | PRN

## 2024-04-05 MED ORDER — GABAPENTIN 300 MG PO CAPS
300.0000 mg | ORAL_CAPSULE | Freq: Three times a day (TID) | ORAL | Status: DC
  Administered 2024-04-05 – 2024-04-09 (×13): 300 mg via ORAL
  Filled 2024-04-05 (×14): qty 1

## 2024-04-05 MED ORDER — POLYETHYLENE GLYCOL 3350 17 G PO PACK: 17.0000 g | PACK | Freq: Every day | ORAL | Status: DC | PRN

## 2024-04-05 MED ORDER — HYDROMORPHONE HCL 1 MG/ML IJ SOLN
0.2500 mg | INTRAMUSCULAR | Status: DC | PRN
  Administered 2024-04-05 (×4): 0.5 mg via INTRAVENOUS

## 2024-04-05 MED ORDER — ONDANSETRON HCL 4 MG/2ML IJ SOLN
INTRAMUSCULAR | Status: DC | PRN
  Administered 2024-04-05: 4 mg via INTRAVENOUS

## 2024-04-05 MED ORDER — SODIUM CHLORIDE 0.9% FLUSH
3.0000 mL | Freq: Two times a day (BID) | INTRAVENOUS | Status: DC
  Administered 2024-04-06 – 2024-04-09 (×8): 3 mL via INTRAVENOUS

## 2024-04-05 MED ORDER — FENTANYL CITRATE (PF) 250 MCG/5ML IJ SOLN
INTRAMUSCULAR | Status: DC | PRN
  Administered 2024-04-05: 25 ug via INTRAVENOUS
  Administered 2024-04-05: 50 ug via INTRAVENOUS
  Administered 2024-04-05: 100 ug via INTRAVENOUS
  Administered 2024-04-05 (×3): 25 ug via INTRAVENOUS

## 2024-04-05 MED ORDER — EPHEDRINE 5 MG/ML INJ
INTRAVENOUS | Status: AC
  Filled 2024-04-05: qty 5

## 2024-04-05 MED ORDER — PHENYLEPHRINE 80 MCG/ML (10ML) SYRINGE FOR IV PUSH (FOR BLOOD PRESSURE SUPPORT)
PREFILLED_SYRINGE | INTRAVENOUS | Status: AC
  Filled 2024-04-05: qty 20

## 2024-04-05 MED ORDER — DEXAMETHASONE SODIUM PHOSPHATE 10 MG/ML IJ SOLN
INTRAMUSCULAR | Status: DC | PRN
  Administered 2024-04-05: 4 mg via INTRAVENOUS

## 2024-04-05 MED ORDER — LACTATED RINGERS IV SOLN: INTRAVENOUS | Status: DC

## 2024-04-05 MED ORDER — SODIUM CHLORIDE 0.9 % IV SOLN: 250.0000 mL | INTRAVENOUS | Status: AC | PRN

## 2024-04-05 MED ORDER — VASHE WOUND IRRIGATION OPTIME
TOPICAL | Status: DC | PRN
  Administered 2024-04-05: 34 [oz_av]

## 2024-04-05 MED ORDER — OXYCODONE HCL 5 MG/5ML PO SOLN: 5.0000 mg | Freq: Once | ORAL | Status: AC | PRN

## 2024-04-05 MED ORDER — CHLORHEXIDINE GLUCONATE 0.12 % MT SOLN
15.0000 mL | Freq: Once | OROMUCOSAL | Status: AC
  Administered 2024-04-05: 15 mL via OROMUCOSAL

## 2024-04-05 MED ORDER — CEFAZOLIN SODIUM-DEXTROSE 2-4 GM/100ML-% IV SOLN
2.0000 g | INTRAVENOUS | Status: AC
Start: 2024-04-05 — End: 2024-04-05
  Administered 2024-04-05: 2 g via INTRAVENOUS
  Filled 2024-04-05: qty 100

## 2024-04-05 MED ORDER — ONDANSETRON HCL 4 MG/2ML IJ SOLN
INTRAMUSCULAR | Status: AC
Start: 2024-04-05 — End: 2024-04-05
  Filled 2024-04-05: qty 2

## 2024-04-05 MED ORDER — HYDROMORPHONE HCL 1 MG/ML IJ SOLN
INTRAMUSCULAR | Status: AC
  Filled 2024-04-05: qty 1

## 2024-04-05 MED ORDER — ACETAMINOPHEN 10 MG/ML IV SOLN
INTRAVENOUS | Status: AC
Start: 2024-04-05 — End: 2024-04-05
  Filled 2024-04-05: qty 100

## 2024-04-05 MED ORDER — HYDROMORPHONE HCL 1 MG/ML IJ SOLN
INTRAMUSCULAR | Status: DC | PRN
  Administered 2024-04-05 (×2): .5 mg via INTRAVENOUS

## 2024-04-05 MED ORDER — OXYCODONE HCL 5 MG PO TABS
5.0000 mg | ORAL_TABLET | ORAL | Status: DC | PRN
  Administered 2024-04-05: 5 mg via ORAL
  Administered 2024-04-05: 10 mg via ORAL
  Administered 2024-04-05: 5 mg via ORAL
  Administered 2024-04-06 (×4): 10 mg via ORAL
  Administered 2024-04-06: 5 mg via ORAL
  Administered 2024-04-07 – 2024-04-09 (×12): 10 mg via ORAL
  Filled 2024-04-05 (×6): qty 2
  Filled 2024-04-05: qty 1
  Filled 2024-04-05 (×12): qty 2

## 2024-04-05 MED ORDER — HYDRALAZINE HCL 20 MG/ML IJ SOLN: 5.0000 mg | INTRAMUSCULAR | Status: DC | PRN

## 2024-04-05 MED ORDER — SODIUM CHLORIDE 0.9 % WEIGHT BASED INFUSION
1.0000 mL/kg/h | INTRAVENOUS | Status: AC
  Administered 2024-04-05: 1 mL/kg/h via INTRAVENOUS

## 2024-04-05 MED ORDER — PROPOFOL 10 MG/ML IV BOLUS
INTRAVENOUS | Status: AC
  Filled 2024-04-05: qty 20

## 2024-04-05 MED ORDER — ATORVASTATIN CALCIUM 40 MG PO TABS
40.0000 mg | ORAL_TABLET | Freq: Every day | ORAL | Status: DC
Start: 2024-04-06 — End: 2024-04-06
  Administered 2024-04-06: 40 mg via ORAL
  Filled 2024-04-05: qty 1

## 2024-04-05 MED ORDER — OXYCODONE HCL 5 MG PO TABS
5.0000 mg | ORAL_TABLET | Freq: Once | ORAL | Status: AC | PRN
  Administered 2024-04-05: 5 mg via ORAL

## 2024-04-05 MED ORDER — ORAL CARE MOUTH RINSE: 15.0000 mL | Freq: Once | OROMUCOSAL | Status: AC

## 2024-04-05 MED ORDER — ONDANSETRON HCL 4 MG/2ML IJ SOLN: 4.0000 mg | Freq: Four times a day (QID) | INTRAMUSCULAR | Status: DC | PRN

## 2024-04-05 SURGICAL SUPPLY — 37 items
BAG COUNTER SPONGE SURGICOUNT (BAG) ×2 IMPLANT
BNDG COHESIVE 4X5 TAN STRL LF (GAUZE/BANDAGES/DRESSINGS) IMPLANT
BNDG ELASTIC 4INX 5YD STR LF (GAUZE/BANDAGES/DRESSINGS) IMPLANT
BNDG ELASTIC 4X5.8 VLCR STR LF (GAUZE/BANDAGES/DRESSINGS) IMPLANT
BNDG ELASTIC 6INX 5YD STR LF (GAUZE/BANDAGES/DRESSINGS) IMPLANT
BNDG GAUZE DERMACEA FLUFF 4 (GAUZE/BANDAGES/DRESSINGS) IMPLANT
CANISTER SUCTION 3000ML PPV (SUCTIONS) ×2 IMPLANT
CANISTER WOUNDNEG PRESSURE 500 (CANNISTER) IMPLANT
CLEANSER WND VASHE INSTL 34OZ (WOUND CARE) IMPLANT
CLIP LIGATING EXTRA MED SLVR (CLIP) ×2 IMPLANT
CLIP LIGATING EXTRA SM BLUE (MISCELLANEOUS) ×2 IMPLANT
COVER SURGICAL LIGHT HANDLE (MISCELLANEOUS) ×2 IMPLANT
DRAPE EXTREMITY T 121X128X90 (DISPOSABLE) IMPLANT
DRAPE HALF SHEET 40X57 (DRAPES) IMPLANT
DRAPE INCISE IOBAN 66X45 STRL (DRAPES) IMPLANT
DRAPE SURG ORHT 6 SPLT 77X108 (DRAPES) IMPLANT
DRSG ADAPTIC 3X8 NADH LF (GAUZE/BANDAGES/DRESSINGS) IMPLANT
DRSG VAC GRANUFOAM MED (GAUZE/BANDAGES/DRESSINGS) IMPLANT
ELECTRODE REM PT RTRN 9FT ADLT (ELECTROSURGICAL) ×2 IMPLANT
GAUZE SPONGE 4X4 12PLY STRL LF (GAUZE/BANDAGES/DRESSINGS) ×2 IMPLANT
GLOVE BIO SURGEON STRL SZ7.5 (GLOVE) ×2 IMPLANT
GOWN STRL REUS W/ TWL LRG LVL3 (GOWN DISPOSABLE) ×4 IMPLANT
GOWN STRL REUS W/ TWL XL LVL3 (GOWN DISPOSABLE) ×2 IMPLANT
GRAFT SKIN WND MICRO 38 (Tissue) IMPLANT
KIT BASIN OR (CUSTOM PROCEDURE TRAY) ×2 IMPLANT
KIT DRSG PREVENA PLUS 7DAY 125 (MISCELLANEOUS) IMPLANT
KIT TURNOVER KIT B (KITS) ×2 IMPLANT
NS IRRIG 1000ML POUR BTL (IV SOLUTION) ×2 IMPLANT
PACK GENERAL/GYN (CUSTOM PROCEDURE TRAY) IMPLANT
PAD ARMBOARD POSITIONER FOAM (MISCELLANEOUS) ×4 IMPLANT
SUT ETHILON 3 0 PS 1 (SUTURE) IMPLANT
SUT VIC AB 2-0 CT1 TAPERPNT 27 (SUTURE) IMPLANT
SUT VIC AB 3-0 SH 27X BRD (SUTURE) IMPLANT
SUT VIC AB 4-0 PS2 18 (SUTURE) IMPLANT
TOWEL GREEN STERILE (TOWEL DISPOSABLE) ×4 IMPLANT
TOWEL GREEN STERILE FF (TOWEL DISPOSABLE) ×2 IMPLANT
WATER STERILE IRR 1000ML POUR (IV SOLUTION) ×2 IMPLANT

## 2024-04-05 NOTE — Transfer of Care (Signed)
 Immediate Anesthesia Transfer of Care Note  Patient: Todd [name]  Procedure(s) Performed: EXCISIONAL DEBRIDEMENT OF LEFT FOOT, BONE DEBRIDEMENT OF LEFT FIRST METATARSAL (Left: Foot) APPLICATION, WOUND VAC (Left: Foot) APPLICATION, SKIN SUBSTITUTE (Foot)  Patient Location: PACU  Anesthesia Type:General  Level of Consciousness: awake, alert , and oriented  Airway & Oxygen Therapy: Patient Spontanous Breathing  Post-op Assessment: Report given to RN and Post -op Vital signs reviewed and stable  Post vital signs: Reviewed and stable  Last Vitals:  Vitals Value Taken Time  BP 176/96 04/05/24 11:31  Temp    Pulse 99 04/05/24 11:35  Resp 14 04/05/24 11:35  SpO2 98 % 04/05/24 11:35  Vitals shown include unfiled device data.  Last Pain:  Vitals:   04/05/24 0803  TempSrc:   PainSc: 6          Complications: No notable events documented.

## 2024-04-05 NOTE — Interval H&P Note (Signed)
 History and Physical Interval Note:  04/05/2024 9:50 AM  Todd [name]  has presented today for surgery, with the diagnosis of post op wound infection.  The various methods of treatment have been discussed with the patient and family. After consideration of risks, benefits and other options for treatment, the patient has consented to  Procedure(s): IRRIGATION AND DEBRIDEMENT WOUND (Left) APPLICATION, WOUND VAC (Left) as a surgical intervention.  The patient's history has been reviewed, patient examined, no change in status, stable for surgery.  I have reviewed the patient's chart and labs.  Questions were answered to the patient's satisfaction.     Debby LOISE Robertson

## 2024-04-05 NOTE — Progress Notes (Signed)
 Pt seen in pacu a couple of times this afternoon.   Wound vac tubing clotted and removed.    Good bleeding in wound bed.  Pressure dressing placed on amputation site.   Pt has oxy 5mg -10mg  q4h prn, tylenol  650mg  po q6h and morphine  2mg  q2h prn.   Reinforce dressing as needed overnight.  Will need vac placed back on wound tomorrow.  Sq heparin  order discontinued.  Plavix  continuing to be held.  Restart baby asa tomorrow.    Lucie Apt, [facility] 04/05/2024 3:10 PM

## 2024-04-05 NOTE — Anesthesia Preprocedure Evaluation (Signed)
 Anesthesia Evaluation  Patient identified by MRN, date of birth, ID band Patient awake    Reviewed: Allergy & Precautions, NPO status , Patient's Chart, lab work & pertinent test results  Airway Mallampati: II  TM Distance: >3 FB Neck ROM: Full    Dental  (+) Poor Dentition, Dental Advisory Given, Chipped   Pulmonary Patient abstained from smoking., former smoker   Pulmonary exam normal breath sounds clear to auscultation       Cardiovascular hypertension, + Peripheral Vascular Disease  Normal cardiovascular exam Rhythm:Regular Rate:Normal  TTE 2025  1. Left ventricular ejection fraction, by estimation, is 60 to 65%. The  left ventricle has normal function. The left ventricle has no regional  wall motion abnormalities. Left ventricular diastolic parameters are  consistent with Grade I diastolic  dysfunction (impaired relaxation).   2. Right ventricular systolic function is normal. The right ventricular  size is normal. There is normal pulmonary artery systolic pressure.   3. The mitral valve is normal in structure. No evidence of mitral valve  regurgitation. No evidence of mitral stenosis.   4. The aortic valve was not well visualized. There is mild calcification  of the aortic valve. There is mild thickening of the aortic valve. Aortic  valve regurgitation is not visualized. Aortic valve sclerosis is present,  with no evidence of aortic valve   stenosis.   5. The inferior vena cava is normal in size with greater than 50%  respiratory variability, suggesting right atrial pressure of 3 mmHg.     Neuro/Psych CVA  negative psych ROS   GI/Hepatic negative GI ROS, Neg liver ROS,,,  Endo/Other  negative endocrine ROS    Renal/GU negative Renal ROS  negative genitourinary   Musculoskeletal negative musculoskeletal ROS (+)    Abdominal   Peds  Hematology  (+) Blood dyscrasia (plavix ), anemia   Anesthesia Other  Findings 58 y.o. 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   Now with dehisced left great toe transmetatarsal amputation site  Reproductive/Obstetrics                              Anesthesia Physical Anesthesia Plan  ASA: 3  Anesthesia Plan: General   Post-op Pain Management: Tylenol  PO (pre-op)*   Induction: Intravenous  PONV Risk Score and Plan: 1 and Treatment may vary due to age or medical condition, Midazolam  and Ondansetron   Airway Management Planned: LMA  Additional Equipment: None  Intra-op Plan:   Post-operative Plan: Extubation in OR  Informed Consent: I have reviewed the patients History and Physical, chart, labs and discussed the procedure including the risks, benefits and alternatives for the proposed anesthesia with the patient or authorized representative who has indicated his/her understanding and acceptance.     Dental advisory given  Plan Discussed with: CRNA  Anesthesia Plan Comments:          Anesthesia Quick Evaluation

## 2024-04-05 NOTE — Progress Notes (Signed)
 Orthopedic Tech Progress Note Patient Details:  Todd [name] 1965-11-07 [id number]  Patient has POST OP SHOE   Patient ID: Todd [name], male   DOB: [birth year], 58 y.o.   MRN: [id number]  [name] Pac 04/05/2024, 11:54 AM

## 2024-04-05 NOTE — Progress Notes (Signed)
 Patient brought to 4E from PACU. Telemetry box applied, CCMD notified. Patient oriented to room and staff. Call bell in reach.    04/05/24 1556  Vitals  Temp 97.8 F (36.6 C)  Temp Source Oral  BP (!) 111/90  MAP (mmHg) 99  BP Location Right Arm  BP Method Automatic  Patient Position (if appropriate) Lying  Pulse Rate 90  Pulse Rate Source Monitor  ECG Heart Rate 90  Resp 17  Level of Consciousness  Level of Consciousness Alert  MEWS COLOR  MEWS Score Color Green  Oxygen Therapy  SpO2 97 %  O2 Device Room Air  MEWS Score  MEWS Temp 0  MEWS Systolic 0  MEWS Pulse 0  MEWS RR 0  MEWS LOC 0  MEWS Score 0

## 2024-04-05 NOTE — Op Note (Signed)
 DATE OF SERVICE: 04/05/2024  PATIENT:  Todd [name]  58 y.o. male  PRE-OPERATIVE DIAGNOSIS:  left foot ischemic wound after toe amputation  POST-OPERATIVE DIAGNOSIS:  Same  PROCEDURE:   1) sharp excisional debridement of left medial foot wound to level of bone (total wound volume 12 x 6 x 3cm) CPT 11043, +11046 x 4) 2) debridement of left first metatarsal bone 2x2x2cm (CPT 11044) 3) application of skin substitute to wound (CPT 15271) 4) wound vac therapy to left foot wound (12 x 6 x 3cm wound volume) CPT 02393  SURGEON:  Surgeons and Role:    * [name] [name], MD - Primary  ASSISTANT: none  ANESTHESIA:   general  EBL: min  BLOOD ADMINISTERED:none  DRAINS: none   LOCAL MEDICATIONS USED:  NONE  SPECIMEN:  none  COUNTS: confirmed correct.  TOURNIQUET:  none  PATIENT DISPOSITION:  PACU - hemodynamically stable.   Delay start of Pharmacological VTE agent (>24hrs) due to surgical blood loss or risk of bleeding: no  INDICATION FOR PROCEDURE: Todd [name] is a 58 y.o. male with left foot ischemic changes after revascularization and toe amputation. He has had progressive tissue ischemia despite one debridement. After careful discussion of risks, benefits, and alternatives the patient was offered debridement of the left foot. The patient understood and wished to proceed.  OPERATIVE FINDINGS: wound debrided to level of metatarsal and down to healthy bleeding tissue. Excellent tissue bleeding. Good result from procedure.   DESCRIPTION OF PROCEDURE: After identification of the patient in the pre-operative holding area, the patient was transferred to the operating room. The patient was positioned supine on the operating room table. Anesthesia was induced. The left foot and ankle were prepped and draped in standard fashion. A surgical pause was performed confirming correct patient, procedure, and operative location.  A 10 blade was used to sharply excise all the necrotic tissue  about the prior amputation and revision site.  Healthy bleeding tissue was encountered about a centimeter beyond the ischemic margin.  The excisional debridement was carried around the circumference of the wound and all devitalized tissue deep to the wound was similarly sharply excised.  The final wound measurements were 12 x 6 x 3 cm.  The first metatarsal bone was debrided for a total volume of about 2 x 2 x 2 cm.  Kerecis skin substitute flakes were then applied to the wound.  A black sponge was cut to fit the wound and applied to the wound.  This was secured with Ioban.  The foot and ankle were then wrapped with Kerlix, Ace wrap, Coban dressing.  Upon completion of the case instrument and sharps counts were confirmed correct. The patient was transferred to the PACU in good condition. I was present for all portions of the procedure.  FOLLOW UP PLAN: Keep VAC x 1 week.  Home health to direct wound VAC changes after 1 week's time 04/12/2024.  Follow-up with Dr. Sheree or PA in 4 weeks for wound check.  [name]. [name], MD [facility] Phone Number: [phone] 04/05/2024 11:17 AM

## 2024-04-05 NOTE — Anesthesia Procedure Notes (Signed)
 Procedure Name: LMA Insertion Date/Time: 04/05/2024 10:31 AM  Performed by: Roslynn Waddell LABOR, CRNAPre-anesthesia Checklist: Patient identified, Emergency Drugs available, Suction available and Patient being monitored Patient Re-evaluated:Patient Re-evaluated prior to induction Oxygen Delivery Method: Circle System Utilized Preoxygenation: Pre-oxygenation with 100% oxygen Induction Type: IV induction Ventilation: Mask ventilation without difficulty LMA: LMA inserted LMA Size: 4.0 Number of attempts: 1 Placement Confirmation: positive ETCO2 Tube secured with: Tape Dental Injury: Teeth and Oropharynx as per pre-operative assessment  Comments: Atraumatic induction/LMA insertion. Dentition and oral mucosa as per preop.

## 2024-04-06 ENCOUNTER — Encounter (HOSPITAL_COMMUNITY): Payer: Self-pay | Admitting: Vascular Surgery

## 2024-04-06 DIAGNOSIS — I7143 Infrarenal abdominal aortic aneurysm, without rupture: Secondary | ICD-10-CM | POA: Diagnosis present

## 2024-04-06 DIAGNOSIS — D62 Acute posthemorrhagic anemia: Secondary | ICD-10-CM | POA: Diagnosis not present

## 2024-04-06 DIAGNOSIS — E785 Hyperlipidemia, unspecified: Secondary | ICD-10-CM | POA: Diagnosis present

## 2024-04-06 DIAGNOSIS — Z89412 Acquired absence of left great toe: Secondary | ICD-10-CM | POA: Diagnosis not present

## 2024-04-06 DIAGNOSIS — T8142XA Infection following a procedure, deep incisional surgical site, initial encounter: Secondary | ICD-10-CM | POA: Diagnosis present

## 2024-04-06 DIAGNOSIS — T8744 Infection of amputation stump, left lower extremity: Secondary | ICD-10-CM | POA: Diagnosis present

## 2024-04-06 DIAGNOSIS — Z8673 Personal history of transient ischemic attack (TIA), and cerebral infarction without residual deficits: Secondary | ICD-10-CM | POA: Diagnosis not present

## 2024-04-06 DIAGNOSIS — Z720 Tobacco use: Secondary | ICD-10-CM | POA: Diagnosis not present

## 2024-04-06 DIAGNOSIS — I10 Essential (primary) hypertension: Secondary | ICD-10-CM | POA: Diagnosis present

## 2024-04-06 DIAGNOSIS — I739 Peripheral vascular disease, unspecified: Secondary | ICD-10-CM | POA: Diagnosis present

## 2024-04-06 DIAGNOSIS — T8781 Dehiscence of amputation stump: Secondary | ICD-10-CM | POA: Diagnosis present

## 2024-04-06 DIAGNOSIS — Y835 Amputation of limb(s) as the cause of abnormal reaction of the patient, or of later complication, without mention of misadventure at the time of the procedure: Secondary | ICD-10-CM | POA: Diagnosis present

## 2024-04-06 LAB — LIPID PANEL
Cholesterol: 118 mg/dL (ref 0–200)
HDL: 31 mg/dL — ABNORMAL LOW (ref >40)
LDL Cholesterol: 68 mg/dL (ref 0–99)
Total CHOL/HDL Ratio: 3.8 ratio
Triglycerides: 97 mg/dL (ref <150)
VLDL: 19 mg/dL (ref 0–40)

## 2024-04-06 LAB — CBC
HCT: 26.1 % — ABNORMAL LOW (ref 39.0–52.0)
Hemoglobin: 8.1 g/dL — ABNORMAL LOW (ref 13.0–17.0)
MCH: 29.5 pg (ref 26.0–34.0)
MCHC: 31 g/dL (ref 30.0–36.0)
MCV: 94.9 fL (ref 80.0–100.0)
Platelets: 256 K/uL (ref 150–400)
RBC: 2.75 MIL/uL — ABNORMAL LOW (ref 4.22–5.81)
RDW: 18.1 % — ABNORMAL HIGH (ref 11.5–15.5)
WBC: 6.2 K/uL (ref 4.0–10.5)
nRBC: 0 % (ref 0.0–0.2)

## 2024-04-06 LAB — BASIC METABOLIC PANEL WITH GFR
Anion gap: 8 (ref 5–15)
BUN: <5 mg/dL — ABNORMAL LOW (ref 6–20)
CO2: 23 mmol/L (ref 22–32)
Calcium: 8.6 mg/dL — ABNORMAL LOW (ref 8.9–10.3)
Chloride: 104 mmol/L (ref 98–111)
Creatinine, Ser: 0.67 mg/dL (ref 0.61–1.24)
GFR, Estimated: >60 mL/min (ref >60)
Glucose, Bld: 106 mg/dL — ABNORMAL HIGH (ref 70–99)
Potassium: 3.8 mmol/L (ref 3.5–5.1)
Sodium: 135 mmol/L (ref 135–145)

## 2024-04-06 MED ORDER — KETOROLAC TROMETHAMINE 30 MG/ML IJ SOLN
30.0000 mg | Freq: Three times a day (TID) | INTRAMUSCULAR | Status: AC
  Administered 2024-04-06 – 2024-04-07 (×3): 30 mg via INTRAVENOUS
  Filled 2024-04-06 (×3): qty 1

## 2024-04-06 MED ORDER — ATORVASTATIN CALCIUM 80 MG PO TABS
80.0000 mg | ORAL_TABLET | Freq: Every day | ORAL | Status: DC
  Administered 2024-04-07 – 2024-04-09 (×3): 80 mg via ORAL
  Filled 2024-04-06 (×3): qty 1

## 2024-04-06 MED ORDER — ATORVASTATIN CALCIUM 40 MG PO TABS
40.0000 mg | ORAL_TABLET | Freq: Once | ORAL | Status: AC
  Administered 2024-04-06: 40 mg via ORAL
  Filled 2024-04-06: qty 1

## 2024-04-06 MED ORDER — HYDROMORPHONE HCL 1 MG/ML IJ SOLN
1.0000 mg | INTRAMUSCULAR | Status: DC | PRN
  Administered 2024-04-06 – 2024-04-09 (×11): 1 mg via INTRAVENOUS
  Filled 2024-04-06 (×13): qty 1

## 2024-04-06 NOTE — Evaluation (Signed)
 Occupational Therapy Evaluation Patient Details Name: Todd [name] MRN: [id number] DOB: 1966/02/05 Today's Date: 04/06/2024   History of Present Illness   Pt is 58 yo presenting to [facility] 9/11 for L foot debridement.  Pt was recently admitted 8/28 for concern for wound dehiscence and possible infection at L great toe amputation site. Pt recently admitted earlier this month for CVA, LLE revascularization and great toe amputation and TCAR. Pt underwent revision of L transmetatarsal amputation on 8/29. PMH includes HTN, CVA, PAD, AAA, pulmonary nodule.     Clinical Impressions Pt presents with decline in function and safety with ADLs and ADL mobility with impaired balance and is limited by L foot pain. PTA pt lives with his wife/other family and was Ind - mod I with ADLs/selfcare and mobility. Pt currently requires set up/Sup - CGA with ADLs/selfcare using compensatory techniques; pt prefers NWB on L foot although he has post shoe to WB through heel due to pain (VAC recently removed) and  would benefit from W/C at home due to pain tolerance and inability to WB through the L foot due to significant pain at surgical site. Pt will have assist at home from his wife as needed. OT will follow acutely to maximize level of function and safety        If plan is discharge home, recommend the following:   A little help with bathing/dressing/bathroom;Assistance with cooking/housework;Assist for transportation;Direct supervision/assist for medications management;Direct supervision/assist for financial management;Supervision due to cognitive status     Functional Status Assessment   Patient has had a recent decline in their functional status and demonstrates the ability to make significant improvements in function in a reasonable and predictable amount of time.     Equipment Recommendations   Wheelchair (measurements OT);Wheelchair cushion (measurements OT);Other (comment) Lexicographer)      Recommendations for Other Services         Precautions/Restrictions   Precautions Precautions: Fall Recall of Precautions/Restrictions: Intact Required Braces or Orthoses: Other Brace Other Brace: darco shoe Restrictions Weight Bearing Restrictions Per Provider Order: Yes LLE Weight Bearing Per Provider Order: Weight bearing as tolerated     Mobility Bed Mobility Overal bed mobility: Modified Independent                  Transfers Overall transfer level: Needs assistance Equipment used: None Transfers: Bed to chair/wheelchair/BSC Sit to Stand: Supervision     Step pivot transfers: Supervision     General transfer comment: supervision for safety, limited by L foot pain, VAC recently removed      Balance Overall balance assessment: Mild deficits observed, not formally tested Sitting-balance support: No upper extremity supported, Feet supported Sitting balance-Leahy Scale: Good     Standing balance support: Bilateral upper extremity supported, Reliant on assistive device for balance Standing balance-Leahy Scale: Fair                             ADL either performed or assessed with clinical judgement   ADL Overall ADL's : Needs assistance/impaired     Grooming: Wash/dry hands;Wash/dry face;Modified independent   Upper Body Bathing: Sitting;Set up   Lower Body Bathing: Contact guard assist;Sitting/lateral leans   Upper Body Dressing : Sitting;Set up   Lower Body Dressing: Contact guard assist;Sitting/lateral leans   Toilet Transfer: Supervision/safety Toilet Transfer Details (indicate cue type and reason): limited by L foot pain, prefers NWB although can heel WB in Dar[place] Toileting- Clothing Manipulation and Hygiene:  Supervision/safety;Sitting/lateral lean       Functional mobility during ADLs: Contact guard assist;Supervision/safety       Vision Baseline Vision/History: 1 Wears glasses Ability to See in Adequate Light: 0  Adequate Patient Visual Report: No change from baseline       Perception         Praxis         Pertinent Vitals/Pain Pain Assessment Pain Assessment: 0-10 Pain Score: 7  Pain Location: L foot Pain Descriptors / Indicators: Burning, Constant, Sharp Pain Intervention(s): Limited activity within patient's tolerance, Premedicated before session, Monitored during session, Repositioned     Extremity/Trunk Assessment Upper Extremity Assessment Upper Extremity Assessment: Overall WFL for tasks assessed   Lower Extremity Assessment Lower Extremity Assessment: Defer to PT evaluation RLE Deficits / Details: Recent L great toe amputation on 8/28 then debridement on 9/11 LLE: Unable to fully assess due to pain   Cervical / Trunk Assessment Cervical / Trunk Assessment: Normal   Communication Communication Communication: No apparent difficulties   Cognition Arousal: Alert Behavior During Therapy: [facility] for tasks assessed/performed                                 Following commands: Intact Following commands impaired: Follows multi-step commands inconsistently     Cueing  General Comments   Cueing Techniques: Verbal cues  VSS on RA.   Exercises     Shoulder Instructions      Home Living Family/patient expects to be discharged to:: Private residence Living Arrangements: Spouse/significant other;Children Available Help at Discharge: Other (Comment);Available PRN/intermittently (other family) Type of Home: House Home Access: Stairs to enter [employer] of Steps: 3 Entrance Stairs-Rails: Right;Left Home Layout: Able to live on main level with bedroom/bathroom     Bathroom Shower/Tub: Producer, television/film/video: Handicapped height     Home Equipment: Agricultural consultant (2 wheels);Grab bars - toilet          Prior Functioning/Environment Prior Level of Function : Independent/Modified Independent;Working/employed;Driving              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, 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 hospitalizations ADLs Comments: Ind with ADLs/selfcare    OT Problem List: Pain;Impaired balance (sitting and/or standing)   OT Treatment/Interventions: Therapeutic exercise;Balance training;Patient/family education;Therapeutic activities;DME and/or AE instruction;Self-care/ADL training      OT Goals(Current goals can be found in the care plan section)   Acute Rehab OT Goals Patient Stated Goal: less pain, go home OT Goal Formulation: With patient Time For Goal Achievement: 04/20/24 Potential to Achieve Goals: Good ADL Goals Pt Will Perform Grooming: with set-up;with modified independence;sitting;standing Pt Will Perform Upper Body Bathing: with modified independence;with caregiver independent in assisting Pt Will Perform Lower Body Bathing: with supervision;with set-up;with modified independence;sitting/lateral leans;sit to/from stand;with caregiver independent in assisting Pt Will Perform Upper Body Dressing: with modified independence;with caregiver independent in assisting Pt Will Perform Lower Body Dressing: with supervision;with set-up;with modified independence;sitting/lateral leans;sit to/from stand;with caregiver independent in assisting Pt Will Transfer to Toilet: with modified independence;stand pivot transfer Pt Will Perform Toileting - Clothing Manipulation and hygiene: with modified independence;sitting/lateral leans;sit to/from stand;with caregiver independent in assisting   OT Frequency:  Min 2X/week    Co-evaluation              AM-PAC OT 6 Clicks Daily Activity     Outcome Measure Help from another  person eating meals?: None Help from another person taking care of personal grooming?: A Little Help from another person toileting, which includes using toliet, bedpan, or urinal?: A Little Help from another person bathing (including washing, rinsing,  drying)?: A Little Help from another person to put on and taking off regular upper body clothing?: A Little Help from another person to put on and taking off regular lower body clothing?: A Little 6 Click Score: 19   End of Session Equipment Utilized During Treatment: Gait belt Nurse Communication: Mobility status  Activity Tolerance: Patient limited by pain Patient left: with call bell/phone within reach;in bed  OT Visit Diagnosis: Other abnormalities of gait and mobility (R26.89);Pain Pain - Right/Left: Left Pain - part of body: Ankle and joints of foot                Time: 1254-1320 OT Time Calculation (min): 26 min Charges:  OT General Charges $OT Visit: 1 Visit OT Evaluation $OT Eval Low Complexity: 1 Low OT Treatments $Therapeutic Activity: 8-22 mins    Jacques Karna Loose 04/06/2024, 2:04 PM

## 2024-04-06 NOTE — Progress Notes (Addendum)
 Progress Note    04/06/2024 6:38 AM 1 Day Post-Op  Subjective:  says he had to take a little bit of IV pain med overnight but the po pain med works good.  Says the dressing was changed yesterday after getting to his room   Afebrile HR 70's-110's 100's-120's systolic 99% RA  Vitals:   04/05/24 2309 04/06/24 0304  BP: 108/78 122/73  Pulse: 80 71  Resp: 10 15  Temp: 97.7 F (36.5 C) (!) 97.5 F (36.4 C)  SpO2: 99% 99%    Physical Exam: General:  no distress Lungs:  non labored Incisions:  left foot bandage is clean but bloody bandages below ace.   CBC    Component Value Date/Time   WBC 6.2 04/06/2024 0318   RBC 2.75 (L) 04/06/2024 0318   HGB 8.1 (L) 04/06/2024 0318   HGB 13.7 10/06/2023 1530   HCT 26.1 (L) 04/06/2024 0318   HCT 40.7 10/06/2023 1530   PLT 256 04/06/2024 0318   PLT 231 10/06/2023 1530   MCV 94.9 04/06/2024 0318   MCV 97 10/06/2023 1530   MCH 29.5 04/06/2024 0318   MCHC 31.0 04/06/2024 0318   RDW 18.1 (H) 04/06/2024 0318   RDW 12.5 10/06/2023 1530   LYMPHSABS 1.1 03/26/2024 0430   LYMPHSABS 2.6 10/06/2023 1530   MONOABS 0.6 03/26/2024 0430   EOSABS 0.2 03/26/2024 0430   EOSABS 0.3 10/06/2023 1530   BASOSABS 0.0 03/26/2024 0430   BASOSABS 0.1 10/06/2023 1530    BMET    Component Value Date/Time   NA 135 04/06/2024 0318   NA 136 10/06/2023 1530   K 3.8 04/06/2024 0318   CL 104 04/06/2024 0318   CO2 23 04/06/2024 0318   GLUCOSE 106 (H) 04/06/2024 0318   BUN <5 (L) 04/06/2024 0318   BUN 16 10/06/2023 1530   CREATININE 0.67 04/06/2024 0318   CALCIUM  8.6 (L) 04/06/2024 0318   GFRNONAA >60 04/06/2024 0318   GFRAA 112 02/16/2018 0939    INR    Component Value Date/Time   INR 1.4 (H) 03/22/2024 1644     Intake/Output Summary (Last 24 hours) at 04/06/2024 9361 Last data filed at 04/06/2024 0335 Gross per 24 hour  Intake 2814.25 ml  Output 1165 ml  Net 1649.25 ml      Assessment/Plan:  58 y.o. 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 [name] 1 Day Post-Op   -bandage was changed yesterday once he was up to his room.  Ace wrap is dry this am.  Most likely can try to get WOC to replace vac this morning but will wait to see Dr. Cleda recommendations as I did not take down dressing this morning.  He is receiving Oxycodone  and received 3 doses of Morphine  since coming from pacu - intervals between doses are increasing.  -acute blood loss anemia-mild drop in hgb from 8.7 to 8.1. -DVT prophylaxis:  SCD's for now   Lucie Apt, [place] [facility] [phone] 04/06/2024 6:38 AM   I have interviewed and examined patient with PA and agree with assessment and plan above.  Wound has excellent bleeding although I do not think a wound VAC is going to be placement at this time.  We placed a wet-to-dry and wrapped with  Ace wrap.  He needs to be in a Darco shoe whenever out of bed.  We adjusted his pain medications to include Dilaudid  rather than morphine  and also will give 3 doses of Toradol .  We encouraged oxycodone  p.o. in lieu of IV medications.  Samah Lapiana C. Sheree, MD [facility] [place] Office: [phone] Pager: [phone]

## 2024-04-06 NOTE — Evaluation (Signed)
 Physical Therapy Evaluation Patient Details Name: Todd [name] MRN: [id number] DOB: 04/30/1966 Today's Date: 04/06/2024  History of Present Illness  Pt is 58 yo presenting to [facility] 9/11 for L foot debridement.  Pt was recently admitted 8/28 for concern for wound dehiscence and possible infection at L great toe amputation site. Pt recently admitted earlier this month for CVA, LLE revascularization and great toe amputation and TCAR. Pt underwent revision of L transmetatarsal amputation on 8/29. PMH includes HTN, CVA, PAD, AAA, pulmonary nodule.  Clinical Impression  Pt is presenting at supervision for transfers and Mod I for bed mobility. Pt would benefit from W/C at home due to limited ability to WB through the L foot secondary to significant pain related to recent amputation and I and D of surgical site. Due to pt current functional status, home set up and available assistance at home no recommended skilled physical therapy services at this time on discharge from acute care hospital setting. Will continue to follow in acute setting in order to ensure that pt returns home with decreased risk for falls, injury, re-hospitalization and improved activity tolerance.          If plan is discharge home, recommend the following: Assistance with cooking/housework;Assist for transportation;Help with stairs or ramp for entrance     Equipment Recommendations BSC/3in1;Wheelchair (measurements PT);Wheelchair cushion (measurements PT) (18 inch with removable arm rests and L elevating leg rests)     Functional Status Assessment Patient has had a recent decline in their functional status and demonstrates the ability to make significant improvements in function in a reasonable and predictable amount of time.     Precautions / Restrictions Precautions Precautions: Fall Recall of Precautions/Restrictions: Intact Required Braces or Orthoses: Other Brace Other Brace: darco shoe Restrictions Weight Bearing  Restrictions Per Provider Order: Yes LLE Weight Bearing Per Provider Order: Weight bearing as tolerated Other Position/Activity Restrictions: heel WB in Darco      Mobility  Bed Mobility Overal bed mobility: Modified Independent      Transfers Overall transfer level: Needs assistance Equipment used: None Transfers: Bed to chair/wheelchair/BSC     Step pivot transfers: Supervision       General transfer comment: supervision for safety. Pt currently prevers non WB on the L foot.    Ambulation/Gait     General Gait Details: pt deferred at this time. Due to concerns of placing wgt through foot due to recent dehiscence of surgical site.    Balance Overall balance assessment: Mild deficits observed, not formally tested         Pertinent Vitals/Pain Pain Assessment Pain Assessment: 0-10 Pain Score: 10-Worst pain ever Pain Location: L foot Pain Descriptors / Indicators: Burning, Constant, Sharp Pain Intervention(s): RN gave pain meds during session, Monitored during session, Limited activity within patient's tolerance    Home Living Family/patient expects to be discharged to:: Private residence Living Arrangements: Spouse/significant other;Children Available Help at Discharge: Other (Comment);Available PRN/intermittently (spouse and family) Type of Home: House Home Access: Stairs to enter Entrance Stairs-Rails: Doctor, general practice of Steps: 3   Home Layout: Able to live on main level with bedroom/bathroom Home Equipment: Rolling Walker (2 wheels);Grab bars - toilet      Prior Function Prior Level of Function : Independent/Modified Independent             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, 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 hospitalizations ADLs Comments: ind ADLs     Extremity/Trunk Assessment  Upper Extremity Assessment Upper Extremity Assessment: Defer to OT evaluation    Lower Extremity  Assessment Lower Extremity Assessment: LLE deficits/detail RLE Deficits / Details: Recent L great toe amputation on 8/28 then debridement on 9/11 LLE: Unable to fully assess due to pain    Cervical / Trunk Assessment Cervical / Trunk Assessment: Normal  Communication   Communication Communication: No apparent difficulties    Cognition Arousal: Alert Behavior During Therapy: WFL for tasks assessed/performed   PT - Cognitive impairments: No apparent impairments     Following commands: Intact Following commands impaired: Follows multi-step commands inconsistently     Cueing Cueing Techniques: Verbal cues     General Comments General comments (skin integrity, edema, etc.): VSS on RA.        Assessment/Plan    PT Assessment Patient needs continued PT services  PT Problem List Decreased strength;Decreased activity tolerance;Decreased balance;Decreased mobility;Decreased knowledge of use of DME;Decreased knowledge of precautions;Pain       PT Treatment Interventions DME instruction;Stair training;Gait training;Functional mobility training;Therapeutic activities;Balance training;Therapeutic exercise;Neuromuscular re-education;Patient/family education;Wheelchair mobility training    PT Goals (Current goals can be found in the Care Plan section)  Acute Rehab PT Goals Patient Stated Goal: To not lose leg PT Goal Formulation: With patient Time For Goal Achievement: 04/20/24 Potential to Achieve Goals: Fair Additional Goals Additional Goal #1: Pt will navigate W/C and parts at [facility] for 50 ft in order to improve independence.    Frequency Min 2X/week        AM-PAC PT 6 Clicks Mobility  Outcome Measure Help needed turning from your back to your side while in a flat bed without using bedrails?: None Help needed moving from lying on your back to sitting on the side of a flat bed without using bedrails?: None Help needed moving to and from a bed to a chair (including a  wheelchair)?: A Little Help needed standing up from a chair using your arms (e.g., wheelchair or bedside chair)?: A Little Help needed to walk in hospital room?: A Lot Help needed climbing 3-5 steps with a railing? : A Lot 6 Click Score: 18    End of Session   Activity Tolerance: Patient tolerated treatment well;Patient limited by pain Patient left: in bed;with call bell/phone within reach Nurse Communication: Mobility status PT Visit Diagnosis: Other abnormalities of gait and mobility (R26.89);Unsteadiness on feet (R26.81);Pain Pain - Right/Left: Left Pain - part of body: Ankle and joints of foot    Time: 1030-1127 PT Time Calculation (min) (ACUTE ONLY): 57 min   Charges:   PT Evaluation $PT Eval Low Complexity: 1 Low PT Treatments $Therapeutic Activity: 38-52 mins PT General Charges $$ ACUTE PT VISIT: 1 Visit         Dorothyann Maier, DPT, CLT  Acute Rehabilitation Services Office: [phone] (Secure chat preferred)   Dorothyann VEAR Maier 04/06/2024, 11:27 AM

## 2024-04-06 NOTE — TOC Initial Note (Addendum)
 Transition of Care (TOC) - Initial/Assessment Note  Todd Gobble RN, BSN Inpatient Care Management Unit 4E- RN Case Manager See Treatment Team for direct phone #   Patient Details  Name: Todd [name] MRN: [id number] Date of Birth: 19-May-1966  Transition of Care [facility]) CM/SW Contact:    Todd [name] Hurst, RN Phone Number: 04/06/2024, 12:28 PM  Clinical Narrative:                 CM spoke with pt at bedside, confirmed pt is active with [facility] for [facility] prior to admit. Pt also confirmed that he has home VAC and supplies, voiced that wife has home VAC in car.   Pt voiced that he will likely stay over weekend per MD, CM will follow for resumption of HH and home VAC needs.  Will need resumption orders for [facility]  Expected Discharge Plan: Home w Home Health Services Barriers to Discharge: Continued Medical Work up   Patient Goals and CMS Choice Patient states their goals for this hospitalization and ongoing recovery are:: return home   Choice offered to / list presented to : Patient      Expected Discharge Plan and Services   Discharge Planning Services: CM Consult Post Acute Care Choice: Home Health, Durable Medical Equipment, Resumption of Svcs/PTA Provider Living arrangements for the past 2 months: Single Family Home                 DME Arranged: Vac (has home VAC w/ 57M/[employer]) DME Agency: [employer]       HH Arranged: RN HH Agency: [facility]        Prior Living Arrangements/Services Living arrangements for the past 2 months: Single Family Home Lives with:: Spouse Patient language and need for interpreter reviewed:: Yes Do you feel safe going back to the place where you live?: Yes      Need for Family Participation in Patient Care: Yes (Comment) Care giver support system in place?: Yes (comment) Current home services: DME (cane/walker/wheelchair) Criminal Activity/Legal Involvement Pertinent to Current Situation/Hospitalization: No - Comment as  needed  Activities of Daily Living   ADL Screening (condition at time of admission) Independently performs ADLs?: Yes (appropriate for developmental age) Is the patient deaf or have difficulty hearing?: No Does the patient have difficulty seeing, even when wearing glasses/contacts?: No Does the patient have difficulty concentrating, remembering, or making decisions?: No  Permission Sought/Granted Permission sought to share information with : Facility Industrial/product designer granted to share information with : Yes, Verbal Permission Granted     Permission granted to share info w AGENCY: HH/DME        Emotional Assessment Appearance:: Appears stated age Attitude/Demeanor/Rapport: Engaged Affect (typically observed): Pleasant Orientation: : Oriented to Self, Oriented to Place, Oriented to  Time, Oriented to Situation Alcohol / Substance Use: Not Applicable Psych Involvement: No (comment)  Admission diagnosis:  Infection of deep incisional surgical site after procedure, initial encounter [T81.42XA] Peripheral arterial disease (HCC) [I73.9] Patient Active Problem List   Diagnosis Date Noted   Peripheral arterial disease (HCC) 04/05/2024   Osteomyelitis (HCC) 03/25/2024   Dyslipidemia 03/25/2024   Post op infection 03/23/2024   Infrarenal abdominal aortic aneurysm (AAA) without rupture (HCC) 02/28/2024   PAD (peripheral artery disease) (HCC) 02/28/2024   Tobacco use disorder 02/28/2024   Pulmonary nodule 02/28/2024   Bilateral carotid artery disease (HCC) 02/28/2024   CVA (cerebral vascular accident) (HCC) 02/27/2024   Ingrown nail of great toe 01/13/2024  Closed fracture of distal phalanx of great toe 12/13/2023   Contusion of great toe of left foot 11/28/2023   Essential hypertension 05/07/2021   PCP:  Todd Bleacher, MD Pharmacy:   [id number] - [place], [place] - [address] [place] [address] Phone: [phone] Fax:  [phone]  [id number] [place], [place] - [address] [place] [address] Phone: [phone] Fax: [phone]  [name] [address] [place] [address] Phone: [phone] Fax: [phone]     Social Drivers of Health (SDOH) Social History: SDOH Screenings   Food Insecurity: No Food Insecurity (04/05/2024)  Housing: Low Risk  (04/05/2024)  Transportation Needs: No Transportation Needs (04/05/2024)  Utilities: Not At Risk (04/05/2024)  Alcohol Screen: Low Risk  (10/05/2022)  Depression (PHQ2-9): Low Risk  (03/22/2024)  Financial Resource Strain: Low Risk  (10/05/2022)  Physical Activity: Sufficiently Active (10/05/2022)  Social Connections: Socially Isolated (10/05/2022)  Stress: No Stress Concern Present (04/07/2023)  Tobacco Use: Medium Risk (04/05/2024)  Health Literacy: Adequate Health Literacy (04/07/2023)   SDOH Interventions:     Readmission Risk Interventions    03/23/2024    4:25 PM  Readmission Risk Prevention Plan  Transportation Screening Complete  PCP or Specialist Appt within 5-7 Days Complete  Home Care Screening Complete  Medication Review (RN CM) Complete

## 2024-04-06 NOTE — Anesthesia Postprocedure Evaluation (Signed)
 Anesthesia Post Note  Patient: Todd [name]  Procedure(s) Performed: EXCISIONAL DEBRIDEMENT OF LEFT FOOT, BONE DEBRIDEMENT OF LEFT FIRST METATARSAL (Left: Foot) APPLICATION, WOUND VAC (Left: Foot) APPLICATION, SKIN SUBSTITUTE (Foot)     Patient location during evaluation: PACU Anesthesia Type: General Level of consciousness: awake and alert Pain management: pain level controlled Vital Signs Assessment: post-procedure vital signs reviewed and stable Respiratory status: spontaneous breathing, nonlabored ventilation, respiratory function stable and patient connected to nasal cannula oxygen Cardiovascular status: blood pressure returned to baseline and stable Postop Assessment: no apparent nausea or vomiting Anesthetic complications: no   No notable events documented.  Last Vitals:  Vitals:   04/06/24 1209 04/06/24 1640  BP: (!) 147/86 121/78  Pulse: 87 82  Resp: 14 11  Temp: 36.4 C 36.5 C  SpO2: 100% 100%    Last Pain:  Vitals:   04/06/24 1847  TempSrc:   PainSc: 9                  [name] JONELLE Peoples

## 2024-04-06 NOTE — Progress Notes (Signed)
 PHARMACIST LIPID MONITORING   Todd [name] is a 58 y.o. male admitted on 04/05/2024 with PAD.  Pharmacy has been consulted to optimize lipid-lowering therapy with the indication of secondary prevention for clinical ASCVD.  Recent Labs:  Lipid Panel (last 6 months):   Lab Results  Component Value Date   CHOL 118 04/06/2024   TRIG 97 04/06/2024   HDL 31 (L) 04/06/2024   CHOLHDL 3.8 04/06/2024   VLDL 19 04/06/2024   LDLCALC 68 04/06/2024    Hepatic function panel (last 6 months):   Lab Results  Component Value Date   AST 25 03/22/2024   ALT 31 03/22/2024   ALKPHOS 135 (H) 03/22/2024   BILITOT 0.6 03/22/2024    SCr (since admission):   Serum creatinine: 0.67 mg/dL 90/87/74 9681 Estimated creatinine clearance: 115.6 mL/min  Current therapy and lipid therapy tolerance Current lipid-lowering therapy: Lipitor 40mg  Previous lipid-lowering therapies (if applicable):  Documented or reported allergies or intolerances to lipid-lowering therapies (if applicable):   Assessment:   Patient agrees with changes to lipid-lowering therapy  Plan:    1.Statin intensity (high intensity recommended for all patients regardless of the LDL):  Add or increase statin to high intensity.  2.Add ezetimibe  (if any one of the following):   Not indicated at this time.  3.Refer to lipid clinic:   No  4.Follow-up with:  Primary care provider - Tanda Bleacher, MD  5.Follow-up labs after discharge:  Changes in lipid therapy were made. Check a lipid panel in 8-12 weeks then annually.       Sergio Batch, PharmD, BCIDP, AAHIVP, CPP Infectious Disease Pharmacist 04/06/2024 11:28 AM

## 2024-04-07 NOTE — Progress Notes (Signed)
  Progress Note    04/07/2024 7:52 AM 2 Days Post-Op  Subjective:    Vitals:   04/07/24 0007 04/07/24 0335  BP: (!) 141/79 127/84  Pulse: 73 68  Resp: 16 13  Temp: 97.6 F (36.4 C) 97.6 F (36.4 C)  SpO2: 100% 96%    Physical Exam: General:  no distress Lungs:  non labored Incisions: Dry left foot bandage   CBC    Component Value Date/Time   WBC 6.2 04/06/2024 0318   RBC 2.75 (L) 04/06/2024 0318   HGB 8.1 (L) 04/06/2024 0318   HGB 13.7 10/06/2023 1530   HCT 26.1 (L) 04/06/2024 0318   HCT 40.7 10/06/2023 1530   PLT 256 04/06/2024 0318   PLT 231 10/06/2023 1530   MCV 94.9 04/06/2024 0318   MCV 97 10/06/2023 1530   MCH 29.5 04/06/2024 0318   MCHC 31.0 04/06/2024 0318   RDW 18.1 (H) 04/06/2024 0318   RDW 12.5 10/06/2023 1530   LYMPHSABS 1.1 03/26/2024 0430   LYMPHSABS 2.6 10/06/2023 1530   MONOABS 0.6 03/26/2024 0430   EOSABS 0.2 03/26/2024 0430   EOSABS 0.3 10/06/2023 1530   BASOSABS 0.0 03/26/2024 0430   BASOSABS 0.1 10/06/2023 1530    BMET    Component Value Date/Time   NA 135 04/06/2024 0318   NA 136 10/06/2023 1530   K 3.8 04/06/2024 0318   CL 104 04/06/2024 0318   CO2 23 04/06/2024 0318   GLUCOSE 106 (H) 04/06/2024 0318   BUN <5 (L) 04/06/2024 0318   BUN 16 10/06/2023 1530   CREATININE 0.67 04/06/2024 0318   CALCIUM  8.6 (L) 04/06/2024 0318   GFRNONAA >60 04/06/2024 0318   GFRAA 112 02/16/2018 0939    INR    Component Value Date/Time   INR 1.4 (H) 03/22/2024 1644     Intake/Output Summary (Last 24 hours) at 04/07/2024 0752 Last data filed at 04/07/2024 0600 Gross per 24 hour  Intake 368 ml  Output 300 ml  Net 68 ml      Assessment/Plan:  58 y.o. 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  [name] 2 Days Post-Op   Patient doing well this morning.  Pain in better control Wound is dry.  He would like to be premedicated prior to wound change.  Will continue wet-to-dry dressing.  Appreciate nursing's help -light Ace wrap Plan will be for him to stay through the weekend with [facility] placement Monday prior to discharge. Heel weightbearing shoe in the room.  Up out of bed with nursing, PT   Fonda FORBES [facility] [phone] 04/07/2024 7:52 AM

## 2024-04-07 NOTE — Plan of Care (Signed)

## 2024-04-07 NOTE — Plan of Care (Signed)

## 2024-04-08 NOTE — Progress Notes (Addendum)
  Progress Note    04/08/2024 7:44 AM 3 Days Post-Op  Subjective:  no events overnight.  Tolerated dressing change well   Vitals:   04/07/24 1946 04/07/24 2345  BP: 119/69 (!) 140/84  Pulse: 82 84  Resp: 20 18  Temp: 97.9 F (36.6 C) 97.8 F (36.6 C)  SpO2: 99% 98%   Physical Exam Lungs:  non labored Incisions:  dressing left in place L foot Neurologic: A&O  CBC    Component Value Date/Time   WBC 6.2 04/06/2024 0318   RBC 2.75 (L) 04/06/2024 0318   HGB 8.1 (L) 04/06/2024 0318   HGB 13.7 10/06/2023 1530   HCT 26.1 (L) 04/06/2024 0318   HCT 40.7 10/06/2023 1530   PLT 256 04/06/2024 0318   PLT 231 10/06/2023 1530   MCV 94.9 04/06/2024 0318   MCV 97 10/06/2023 1530   MCH 29.5 04/06/2024 0318   MCHC 31.0 04/06/2024 0318   RDW 18.1 (H) 04/06/2024 0318   RDW 12.5 10/06/2023 1530   LYMPHSABS 1.1 03/26/2024 0430   LYMPHSABS 2.6 10/06/2023 1530   MONOABS 0.6 03/26/2024 0430   EOSABS 0.2 03/26/2024 0430   EOSABS 0.3 10/06/2023 1530   BASOSABS 0.0 03/26/2024 0430   BASOSABS 0.1 10/06/2023 1530    BMET    Component Value Date/Time   NA 135 04/06/2024 0318   NA 136 10/06/2023 1530   K 3.8 04/06/2024 0318   CL 104 04/06/2024 0318   CO2 23 04/06/2024 0318   GLUCOSE 106 (H) 04/06/2024 0318   BUN <5 (L) 04/06/2024 0318   BUN 16 10/06/2023 1530   CREATININE 0.67 04/06/2024 0318   CALCIUM  8.6 (L) 04/06/2024 0318   GFRNONAA >60 04/06/2024 0318   GFRAA 112 02/16/2018 0939    INR    Component Value Date/Time   INR 1.4 (H) 03/22/2024 1644     Intake/Output Summary (Last 24 hours) at 04/08/2024 0744 Last data filed at 04/08/2024 0458 Gross per 24 hour  Intake --  Output 800 ml  Net -800 ml     Assessment/Plan:  58 y.o. male is s/p sharp excisional debridement of left medial foot wound to level of bone, debridement of left first metatarsal bone 2x2x2cm, application of skin substitute to wound, wound vac therapy to left foot wound 04/05/2024 for left foot  ischemic foot wound after toe amputation by Dr. [name]  3 Days Post-Op   Patient doing well.  Tolerated dressing change yesterday.  Wet to dry again today.  Plan to replace wound vac tomorrow and possibly discharge home.  OOB and ambulate.  Heel weight bearing only   Donnice Sender, PA-C [facility] [phone] 04/08/2024 7:44 AM  VASCULAR STAFF ADDENDUM: I agree with the above.    Fonda FORBES Rim MD [facility] Phone Number: [phone] 04/08/2024 7:47 AM

## 2024-04-08 NOTE — Plan of Care (Signed)

## 2024-04-09 ENCOUNTER — Other Ambulatory Visit (HOSPITAL_COMMUNITY): Payer: Self-pay

## 2024-04-09 MED ORDER — OXYCODONE HCL 5 MG PO TABS
5.0000 mg | ORAL_TABLET | Freq: Four times a day (QID) | ORAL | 0 refills | Status: DC | PRN
  Filled 2024-04-09: qty 15, 4d supply, fill #0

## 2024-04-09 MED ORDER — ATORVASTATIN CALCIUM 80 MG PO TABS
80.0000 mg | ORAL_TABLET | Freq: Every day | ORAL | 5 refills | Status: DC
  Filled 2024-04-09 – 2024-05-07 (×2): qty 30, 30d supply, fill #0
  Filled 2024-06-06 – 2024-06-18 (×2): qty 30, 30d supply, fill #1
  Filled 2024-07-17: qty 30, 30d supply, fill #2

## 2024-04-09 NOTE — Progress Notes (Addendum)
 DISCHARGE NOTE HOME Todd [name] to be discharged Home per MD order. Discussed prescriptions and follow up appointments with the patient. Prescriptions given to patient; medication list explained in detail. Patient verbalized understanding.  Skin clean, dry and intact without evidence of skin break down, no evidence of skin tears noted. IV catheter discontinued intact. Site without signs and symptoms of complications. Dressing and pressure applied. Pt denies pain at the site currently. No complaints noted.  Going home with incison right great toe and negative pressure therapy Patient free of other lines, drains, and wounds. Scarring reamains from previous surgery to neck and groin  An After Visit Summary (AVS) was printed and given to the patient. Patient will be escorted via wheelchair, and discharged home via private auto once ride arrives  Todd [name], [employer]

## 2024-04-09 NOTE — Plan of Care (Signed)

## 2024-04-09 NOTE — TOC CM/SW Note (Signed)
   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  A walker will not resolve  issue with performing activities of daily living. A wheelchair will allow patient to safely perform daily activities. Patient is not able to propel themselves in the home using a standard weight wheelchair due to endurance. Patient can self propel in the lightweight wheelchair. Length of need Lifetime. Accessories: elevating leg rests (ELRs), wheel locks, extensions and anti-tippers. Back cushion, removable arm rest, 18 in  04/09/24 1425 Pt s/p excisional debridement of left medial metatarsal amputation with impaired mobility secondary to pain, a wheelchair would help reduce and/or eliminate the risk of falls while performing activities of daily living.

## 2024-04-09 NOTE — Discharge Summary (Signed)
 Discharge Summary    Todd [name] 05/29/1966 58 y.o. male  [id number]  Admission Date: 04/05/2024  Discharge Date: 04/09/2024  Physician: [name] [name], MD  Admission Diagnosis: Infection of deep incisional surgical site after procedure, initial encounter [T81.42XA] Peripheral arterial disease (HCC) [I73.9]   HPI:   This is a 58 y.o. male left-sided transcarotid artery stenting for symptomatic disease with subsequent EVAR for aneurysm occlusive disease as well as left common femoral endarterectomy and stenting of the left SFA. At that time he underwent left great toe amputation and underwent subsequent transmetatarsal left great toe amputation. He is now here with concerns of nonhealing.   Hospital Course:  The patient was admitted to the hospital and taken to the operating room on 04/05/2024 and underwent: 1) sharp excisional debridement of left medial foot wound to level of bone (total wound volume 12 x 6 x 3cm) CPT 11043, +11046 x 4) 2) debridement of left first metatarsal bone 2x2x2cm (CPT 11044) 3) application of skin substitute to wound (CPT 15271) 4) wound vac therapy to left foot wound (12 x 6 x 3cm wound volume) CPT 02393    OPERATIVE FINDINGS: wound debrided to level of metatarsal and down to healthy bleeding tissue. Excellent tissue bleeding. Good result from procedure.   The pt tolerated the procedure well and was transported to the PACU in good condition.   Pt seen in pacu a couple of times this afternoon.   Wound vac tubing clotted and removed.    Good bleeding in wound bed.  Pressure dressing placed on amputation site.   Pt has oxy 5mg -10mg  q4h prn, tylenol  650mg  po q6h and morphine  2mg  q2h prn.   Reinforce dressing as needed overnight.  Will need vac placed back on wound tomorrow.  Sq heparin  order discontinued.  Plavix  continuing to be held.  Restart baby asa tomorrow.   POD 1, wound has excellent bleeding although I do not think a wound VAC is going to  be placement at this time.  We placed a wet-to-dry and wrapped with Ace wrap.  He needs to be in a Darco shoe whenever out of bed.  We adjusted his pain medications to include Dilaudid  rather than morphine  and also will give 3 doses of Toradol .  We encouraged oxycodone  p.o. in lieu of IV medications.   POD 2, wound dry - wet to dry dressings continued. Ambulate with Darco shoe and heel weight bearing only.   POD 3, WOC consulted for vac placement.  He is discharged home this day.   CBC    Component Value Date/Time   WBC 6.2 04/06/2024 0318   RBC 2.75 (L) 04/06/2024 0318   HGB 8.1 (L) 04/06/2024 0318   HGB 13.7 10/06/2023 1530   HCT 26.1 (L) 04/06/2024 0318   HCT 40.7 10/06/2023 1530   PLT 256 04/06/2024 0318   PLT 231 10/06/2023 1530   MCV 94.9 04/06/2024 0318   MCV 97 10/06/2023 1530   MCH 29.5 04/06/2024 0318   MCHC 31.0 04/06/2024 0318   RDW 18.1 (H) 04/06/2024 0318   RDW 12.5 10/06/2023 1530   LYMPHSABS 1.1 03/26/2024 0430   LYMPHSABS 2.6 10/06/2023 1530   MONOABS 0.6 03/26/2024 0430   EOSABS 0.2 03/26/2024 0430   EOSABS 0.3 10/06/2023 1530   BASOSABS 0.0 03/26/2024 0430   BASOSABS 0.1 10/06/2023 1530    BMET    Component Value Date/Time   NA 135 04/06/2024 0318   NA 136 10/06/2023 1530   K 3.8 04/06/2024  0318   CL 104 04/06/2024 0318   CO2 23 04/06/2024 0318   GLUCOSE 106 (H) 04/06/2024 0318   BUN <5 (L) 04/06/2024 0318   BUN 16 10/06/2023 1530   CREATININE 0.67 04/06/2024 0318   CALCIUM  8.6 (L) 04/06/2024 0318   GFRNONAA >60 04/06/2024 0318   GFRAA 112 02/16/2018 0939      Discharge Instructions     Discharge patient   Complete by: As directed    Discharge after Kristi with TOC has seen pt.  Meds sent to [facility] pharmacy. Thanks   Discharge disposition: 01-Home or Self Care   Discharge patient date: 04/09/2024       Discharge Diagnosis:  Infection of deep incisional surgical site after procedure, initial encounter [T81.42XA] Peripheral arterial disease  (HCC) [I73.9]  Secondary Diagnosis: Patient Active Problem List   Diagnosis Date Noted   Peripheral arterial disease (HCC) 04/05/2024   Osteomyelitis (HCC) 03/25/2024   Dyslipidemia 03/25/2024   Post op infection 03/23/2024   Infrarenal abdominal aortic aneurysm (AAA) without rupture (HCC) 02/28/2024   PAD (peripheral artery disease) (HCC) 02/28/2024   Tobacco use disorder 02/28/2024   Pulmonary nodule 02/28/2024   Bilateral carotid artery disease (HCC) 02/28/2024   CVA (cerebral vascular accident) (HCC) 02/27/2024   Ingrown nail of great toe 01/13/2024   Closed fracture of distal phalanx of great toe 12/13/2023   Contusion of great toe of left foot 11/28/2023   Essential hypertension 05/07/2021   Past Medical History:  Diagnosis Date   Hyperlipidemia    Hypertension    Stroke [facility])      Allergies as of 04/09/2024   No Known Allergies      Medication List     STOP taking these medications    ciprofloxacin  500 MG tablet Commonly known as: Cipro    nicotine  polacrilex 2 MG gum Commonly known as: NICORETTE        TAKE these medications    acetaminophen  325 MG tablet Commonly known as: TYLENOL  Take 2 tablets (650 mg total) by mouth every 6 (six) hours as needed for mild pain (pain score 1-3) or fever (or Fever >/= 101).   aspirin  EC 81 MG tablet Take 1 tablet (81 mg total) by mouth daily. Swallow whole.   atorvastatin  80 MG tablet Commonly known as: LIPITOR Take 1 tablet (80 mg total) by mouth daily. What changed:  medication strength how much to take   clopidogrel  75 MG tablet Commonly known as: PLAVIX  Take 1 tablet (75 mg total) by mouth daily.   gabapentin  300 MG capsule Commonly known as: NEURONTIN  Take 2 capsules (600 mg total) by mouth 3 (three) times daily. What changed: how much to take   lisinopril  20 MG tablet Commonly known as: ZESTRIL  TAKE 1 TABLET BY MOUTH EVERY DAY   nicotine  21 mg/24hr patch Commonly known as: NICODERM CQ  - dosed in  mg/24 hours Place 1 patch (21 mg total) onto the skin daily.   oxyCODONE  5 MG immediate release tablet Commonly known as: Oxy IR/ROXICODONE  Take 1 tablet (5 mg total) by mouth every 6 (six) hours as needed for moderate pain (pain score 4-6). What changed: when to take this        Prescriptions given: Roxicodone  #15 No Refill   Disposition: home with [facility]  Patient's condition: is Good  Follow up: 1. Dr. Sheree in 4 weeks (pt has appt 05/09/2024 with Dr. Sheree)   Lucie Apt, PA-C [facility] [phone] 04/09/2024  2:10 PM

## 2024-04-09 NOTE — Progress Notes (Addendum)
  Progress Note    04/09/2024 6:49 AM 4 Days Post-Op  Subjective:  no complaints  Afebrile HR 60's-80's  130's-150's systolic 94% RA  Vitals:   04/08/24 2014 04/09/24 0336  BP: 130/87 (!) 153/92  Pulse: 80 81  Resp:  16  Temp: 97.8 F (36.6 C) 98.4 F (36.9 C)  SpO2: 99% 99%    Physical Exam: General:  no distress Lungs:  non labored Incisions:    Extremities:    Lateral left foot    CBC    Component Value Date/Time   WBC 6.2 04/06/2024 0318   RBC 2.75 (L) 04/06/2024 0318   HGB 8.1 (L) 04/06/2024 0318   HGB 13.7 10/06/2023 1530   HCT 26.1 (L) 04/06/2024 0318   HCT 40.7 10/06/2023 1530   PLT 256 04/06/2024 0318   PLT 231 10/06/2023 1530   MCV 94.9 04/06/2024 0318   MCV 97 10/06/2023 1530   MCH 29.5 04/06/2024 0318   MCHC 31.0 04/06/2024 0318   RDW 18.1 (H) 04/06/2024 0318   RDW 12.5 10/06/2023 1530   LYMPHSABS 1.1 03/26/2024 0430   LYMPHSABS 2.6 10/06/2023 1530   MONOABS 0.6 03/26/2024 0430   EOSABS 0.2 03/26/2024 0430   EOSABS 0.3 10/06/2023 1530   BASOSABS 0.0 03/26/2024 0430   BASOSABS 0.1 10/06/2023 1530    BMET    Component Value Date/Time   NA 135 04/06/2024 0318   NA 136 10/06/2023 1530   K 3.8 04/06/2024 0318   CL 104 04/06/2024 0318   CO2 23 04/06/2024 0318   GLUCOSE 106 (H) 04/06/2024 0318   BUN <5 (L) 04/06/2024 0318   BUN 16 10/06/2023 1530   CREATININE 0.67 04/06/2024 0318   CALCIUM  8.6 (L) 04/06/2024 0318   GFRNONAA >60 04/06/2024 0318   GFRAA 112 02/16/2018 0939    INR    Component Value Date/Time   INR 1.4 (H) 03/22/2024 1644     Intake/Output Summary (Last 24 hours) at 04/09/2024 0649 Last data filed at 04/08/2024 1300 Gross per 24 hour  Intake 240 ml  Output --  Net 240 ml      Assessment/Plan:  58 y.o. male is s/p:  sharp excisional debridement of left medial foot wound to level of bone, debridement of left first metatarsal bone 2x2x2cm, application of skin substitute to wound, wound vac therapy to left  foot wound 04/05/2024 for left foot ischemic foot wound after toe amputation by Dr. [name]   4 Days Post-Op   -dressing removed this am and as above-pt tolerated well.  Wound is clean and appears healthy.  Will consult WOC to replace vac.  His home vac is in the room and fully charged.   Continue to monitor lateral aspect of left foot.  -plan to replace wound vac today -order placed to resume [facility] RN -DVT prophylaxis:  SCD's   Lucie Apt, PA-C [facility] [phone] 04/09/2024 6:49 AM  I have independently interviewed and examined patient and agree with PA assessment and plan above.   Jaline Pincock C. Sheree, MD [facility] [place] Office: [phone] Pager: [phone]

## 2024-04-09 NOTE — TOC Transition Note (Signed)
 Transition of Care (TOC) - Discharge Note Rayfield Gobble RN, BSN Inpatient Care Management Unit 4E- RN Case Manager See Treatment Team for direct phone #   Patient Details  Name: Todd [name] MRN: [id number] Date of Birth: 30-Sep-1965  Transition of Care [facility]) CM/SW Contact:  Gobble Rayfield Hurst, RN Phone Number: 04/09/2024, 3:04 PM   Clinical Narrative:    Pt stable for transition home today, Pt has home VAC and supplies at the bedside to transition home with. Per [facility]VS next wound drsg change will be Tamara 9/18 with 2x week drsg changes- HH updated.   Pt active with [facility]- services to resume for [facility] needs- home VAC. Liaison has confirmed they will be able to continue to staff and will follow for next VAC drsg at home.   Wife to transport home.   CM spoke with pt at bedside for DME needs- per pt he has RW at bedside that is his, he feels he will need wheelchair for home due to limited mobility and steps that he has at home. Pt declined need for [facility] or shower chair at this time.  Order placed for wheelchair, pt has no preference on provider.  Call made to [facility]- they are OON with insurance-  Call made to [facility] they can accept referral and will deliver a w/c to pt prior to discharge  Meds have been sent to [facility] pharmacy.   No further CM needs noted    Final next level of care: Home w Home Health Services Barriers to Discharge: Barriers Resolved   Patient Goals and CMS Choice Patient states their goals for this hospitalization and ongoing recovery are:: return home   Choice offered to / list presented to : Patient      Discharge Placement               Home w/ [facility]        Discharge Plan and Services Additional resources added to the After Visit Summary for     Discharge Planning Services: CM Consult Post Acute Care Choice: Home Health, Durable Medical Equipment, Resumption of Svcs/PTA Provider          DME Arranged: Vac (has home VAC w/ [facility]) DME Agency:  [facility]     Representative spoke with at DME Agency: pt already has home VAC at bedside HH Arranged: RN HH Agency: [facility] Date HH Agency Contacted: 04/09/24 Time HH Agency Contacted: 1200 Representative spoke with at [facility] Agency: [facility]  Social Drivers of Health (SDOH) Interventions SDOH Screenings   Food Insecurity: No Food Insecurity (04/05/2024)  Housing: Low Risk  (04/05/2024)  Transportation Needs: No Transportation Needs (04/05/2024)  Utilities: Not At Risk (04/05/2024)  Alcohol Screen: Low Risk  (10/05/2022)  Depression (PHQ2-9): Low Risk  (03/22/2024)  Financial Resource Strain: Low Risk  (10/05/2022)  Physical Activity: Sufficiently Active (10/05/2022)  Social Connections: Socially Isolated (10/05/2022)  Stress: No Stress Concern Present (04/07/2023)  Tobacco Use: Medium Risk (04/05/2024)  Health Literacy: Adequate Health Literacy (04/07/2023)     Readmission Risk Interventions    04/09/2024    3:04 PM 03/23/2024    4:25 PM  Readmission Risk Prevention Plan  Post Dischage Appt Complete   Medication Screening Complete   Transportation Screening Complete Complete  PCP or Specialist Appt within 5-7 Days  Complete  Home Care Screening  Complete  Medication Review (RN CM)  Complete

## 2024-04-09 NOTE — Progress Notes (Signed)
 Occupational Therapy Treatment Patient Details Name: Todd [name] MRN: [id number] DOB: 1966-07-18 Today's Date: 04/09/2024   History of present illness Pt is 58 yo presenting to [facility] 9/11 for L foot debridement.  Pt was recently admitted 8/28 for concern for wound dehiscence and possible infection at L great toe amputation site. Pt recently admitted earlier this month for CVA, LLE revascularization and great toe amputation and TCAR. Pt underwent revision of L transmetatarsal amputation on 8/29. PMH includes HTN, CVA, PAD, AAA, pulmonary nodule.   OT comments  Pt presented in bed and reported they were waiting for pain meds but agreeable to complete what they can. Pt at this this time completed UE dressing and bathing and peri care post set up. Pt educated about use of WC as has 3 steps up the front entrance and one step in the back of the home but must go through grass to enter the home. Pt reported he has enough family support to assist with bumping up WC if needed. He was able educated about transfer bench for showers. At this time Acute Occupational Therapy to follow with no follow up recommendations.       If plan is discharge home, recommend the following:  A little help with bathing/dressing/bathroom;Assistance with cooking/housework;Assist for transportation;Direct supervision/assist for medications management;Direct supervision/assist for financial management;Supervision due to cognitive status   Equipment Recommendations  Wheelchair (measurements OT);Wheelchair cushion (measurements OT);Other (comment) Lexicographer) Transfer bench    Recommendations for Other Services      Precautions / Restrictions Precautions Precautions: Fall Recall of Precautions/Restrictions: Intact Precaution/Restrictions Comments: new wound vac plaement planned for today Required Braces or Orthoses: Other Brace Other Brace: darco shoe Restrictions Weight Bearing Restrictions Per Provider Order: Yes LLE Weight  Bearing Per Provider Order: Touchdown weight bearing Other Position/Activity Restrictions: heel WB in Darco       Mobility Bed Mobility               General bed mobility comments: Pt only completed long sitting at this time due to pain with LLE    Transfers                   General transfer comment: deffered due to pain, at this time educated about positioning with pain     Balance Overall balance assessment: Needs assistance Sitting-balance support: Feet supported Sitting balance-Leahy Scale: Good Sitting balance - Comments: long sitting                                   ADL either performed or assessed with clinical judgement   ADL Overall ADL's : Needs assistance/impaired Eating/Feeding: Independent;Sitting   Grooming: Wash/dry hands;Wash/dry face;Oral care;Applying deodorant;Set up;Sitting   Upper Body Bathing: Set up;Sitting   Lower Body Bathing: Contact guard assist;Bed level Lower Body Bathing Details (indicate cue type and reason): long sitting Upper Body Dressing : Set up;Sitting   Lower Body Dressing: Contact guard assist;Bed level Lower Body Dressing Details (indicate cue type and reason): long sitting                    Extremity/Trunk Assessment Upper Extremity Assessment Upper Extremity Assessment: RUE deficits/detail RUE Deficits / Details: Pt had slight weakness than LUE and reported since stroke was weaker. 4/5 at shoulder RUE Sensation: WNL RUE Coordination: WNL   Lower Extremity Assessment Lower Extremity Assessment: Defer to PT evaluation  Vision   Vision Assessment?: No apparent visual deficits   Perception     Praxis     Communication Communication Communication: No apparent difficulties Factors Affecting Communication: Reduced clarity of speech   Cognition Arousal: Alert Behavior During Therapy: WFL for tasks assessed/performed Cognition: No apparent impairments                                Following commands: Intact Following commands impaired: Follows multi-step commands inconsistently      Cueing   Cueing Techniques: Verbal cues  Exercises      Shoulder Instructions       General Comments      Pertinent Vitals/ Pain       Pain Assessment Pain Assessment: 0-10 Pain Score: 10-Worst pain ever Breathing: normal Pain Location: L foot Pain Descriptors / Indicators: Burning, Constant, Sharp Pain Intervention(s): Limited activity within patient's tolerance, Monitored during session, Repositioned (nursing already aware of pain)  Home Living                                          Prior Functioning/Environment              Frequency  Min 2X/week        Progress Toward Goals  OT Goals(current goals can now be found in the care plan section)  Progress towards OT goals: Progressing toward goals  Acute Rehab OT Goals Patient Stated Goal: to get home OT Goal Formulation: With patient Time For Goal Achievement: 04/20/24 Potential to Achieve Goals: Good ADL Goals Pt Will Perform Grooming: with set-up;with modified independence;sitting;standing Pt Will Perform Upper Body Bathing: with modified independence;with caregiver independent in assisting Pt Will Perform Lower Body Bathing: with supervision;with set-up;with modified independence;sitting/lateral leans;sit to/from stand;with caregiver independent in assisting Pt Will Perform Upper Body Dressing: with modified independence;with caregiver independent in assisting Pt Will Perform Lower Body Dressing: with supervision;with set-up;with modified independence;sitting/lateral leans;sit to/from stand;with caregiver independent in assisting Pt Will Transfer to Toilet: with modified independence;stand pivot transfer Pt Will Perform Toileting - Clothing Manipulation and hygiene: with modified independence;sitting/lateral leans;sit to/from stand;with caregiver independent in  assisting  Plan      Co-evaluation                 AM-PAC OT 6 Clicks Daily Activity     Outcome Measure   Help from another person eating meals?: None Help from another person taking care of personal grooming?: A Little Help from another person toileting, which includes using toliet, bedpan, or urinal?: A Little Help from another person bathing (including washing, rinsing, drying)?: A Little Help from another person to put on and taking off regular upper body clothing?: A Little Help from another person to put on and taking off regular lower body clothing?: A Little 6 Click Score: 19    End of Session    OT Visit Diagnosis: Other abnormalities of gait and mobility (R26.89);Pain Pain - Right/Left: Left Pain - part of body: Leg;Ankle and joints of foot   Activity Tolerance Patient limited by pain   Patient Left with call bell/phone within reach;in bed   Nurse Communication Mobility status        Time: 9052-8997 OT Time Calculation (min): 15 min  Charges: OT General Charges $OT Visit: 1 Visit OT Treatments $Self Care/Home Management : 8-22  mins  Warrick POUR OTR/L  Acute Rehab Services  [phone] office number   Warrick Berber 04/09/2024, 12:22 PM

## 2024-04-09 NOTE — Progress Notes (Signed)
 Physical Therapy Treatment Patient Details Name: Todd [name] MRN: [id number] DOB: [birth year] Today's Date: 04/09/2024   History of Present Illness Pt is 58 yo presenting to [facility] 9/11 for L foot debridement.  Pt was recently admitted 8/28 for concern for wound dehiscence and possible infection at L great toe amputation site. Pt recently admitted earlier this month for CVA, LLE revascularization and great toe amputation and TCAR. Pt underwent revision of L transmetatarsal amputation on 8/29. PMH includes HTN, CVA, PAD, AAA, pulmonary nodule.    PT Comments  Pt received in supine, agreeable to therapy session at bed level only due to pain despite PO meds ~1 hour prior (just Tylenol ) and pt concerned about DC plan for today due to c/o severe pain. Pt modI for repositioning in long sitting in bed and able to perform SLR unassisted with no quad lag with holding limb up >10 seconds unassisted. PTA reviews HEP and offered to bring [facility] to work on wheelchair parts/mgmt instruction but pt defers OOB to wheelchair at time of session due to c/o pain. Pt given handouts on HEP (link below), as well as wheelchair mobility up stairs or over curb with caregiver assist. Pt continues to benefit from PT services to progress toward functional mobility goals, he reports he won't be able to get in his house unless he gets a wheelchair so he can mobilize across grassy lawn to back entrance which only has 1 step and family can help him up threshold in WC this way, he can't hop over there with RW due to rough terrain.     If plan is discharge home, recommend the following: Assistance with cooking/housework;Assist for transportation;Help with stairs or ramp for entrance   Can travel by private vehicle        Equipment Recommendations  BSC/3in1;Wheelchair (measurements PT);Wheelchair cushion (measurements PT) (18 inch with removable arm rests and L elevating leg rest)    Recommendations for Other Services       Precautions  / Restrictions Precautions Precautions: Fall Recall of Precautions/Restrictions: Intact Precaution/Restrictions Comments: wound vac intact; pt reports pain very severe Required Braces or Orthoses: Other Brace Other Brace: darco shoe Restrictions Weight Bearing Restrictions Per Provider Order: Yes LLE Weight Bearing Per Provider Order: Touchdown weight bearing Other Position/Activity Restrictions: heel WB in Darco     Mobility  Bed Mobility Overal bed mobility: Modified Independent             General bed mobility comments: Pt only completed long sitting at this time due to pain with LLE    Transfers                   General transfer comment: pt defers; PTA assisted him to reposition LLE and placed pillow under calf with heel floated for comfort, pt appreciative as blankets were too firm    Ambulation/Gait               General Gait Details: pt defers transfer, gait or wheelchair mobility training due to c/o LLE pain   Dance movement psychotherapist Wheelchair mobility:  (pt discussed safety with WC brakes and pt reports he knows about that but may need reinforcement on leg rest placement/removal, but defers at time of session due to LLE pain)   Tilt Bed    Modified Rankin (Stroke Patients Only)       Balance Overall balance assessment: Needs assistance Sitting-balance support: Feet  supported Sitting balance-Leahy Scale: Good Sitting balance - Comments: long sitting in bed                                    Communication Communication Communication: No apparent difficulties Factors Affecting Communication: Reduced clarity of speech  Cognition Arousal: Alert Behavior During Therapy: WFL for tasks assessed/performed, Anxious   PT - Cognitive impairments: No apparent impairments                         Following commands: Intact Following commands impaired: Follows multi-step  commands inconsistently    Cueing Cueing Techniques: Verbal cues  Exercises Other Exercises Other Exercises: reviewed supine BLE AROM: ankle pumps (as pain allows), quad sets, SLR, pt performs a few reps for teachback Other Exercises: reviewed seated BLE AROM: LAQ, hip flexion, pt defers to attempt due to severe LLE pain, handout reviewed with instruction included Other Exercises: pt instructed on WC/chair push-ups but defers to perform, discussed only using one leg (R) due to LLE pain/WB restrictions, pt receptive Other Exercises: Visit  [email or web address]  Access Code: [email or web address]    General Comments General comments (skin integrity, edema, etc.): LLE ace wrapped and wound vac appears to be intact and working. LLE elevated for pt comfort and heels floated.      Pertinent Vitals/Pain Pain Assessment Pain Assessment: 0-10 Pain Score: 10-Worst pain ever Breathing: normal Pain Location: L foot Pain Descriptors / Indicators: Burning, Constant, Sharp, Grimacing, Guarding Pain Intervention(s): Limited activity within patient's tolerance, Monitored during session, Premedicated before session, Repositioned, Patient requesting pain meds-RN notified    Home Living                          Prior Function            PT Goals (current goals can now be found in the care plan section) Acute Rehab PT Goals Patient Stated Goal: To go home once pain under control, ASAP. PT Goal Formulation: With patient Time For Goal Achievement: 04/20/24 Progress towards PT goals: Progressing toward goals (slowly due to pain)    Frequency    Min 2X/week      PT Plan      Co-evaluation              AM-PAC PT 6 Clicks Mobility   Outcome Measure  Help needed turning from your back to your side while in a flat bed without using bedrails?: None Help needed moving from lying on your back to sitting on the side of a flat bed without using bedrails?: None Help needed  moving to and from a bed to a chair (including a wheelchair)?: A Little Help needed standing up from a chair using your arms (e.g., wheelchair or bedside chair)?: A Little Help needed to walk in hospital room?: A Lot Help needed climbing 3-5 steps with a railing? : Total 6 Click Score: 17    End of Session Equipment Utilized During Treatment: Other (comment) (PTA offered to get him gait belt for home, pt unsure) Activity Tolerance: Patient limited by pain Patient left: in bed;with call bell/phone within reach;Other (comment) (LLE elevated with heels floated) Nurse Communication: Mobility status;Patient requests pain meds PT Visit Diagnosis: Other abnormalities of gait and mobility (R26.89);Unsteadiness on feet (R26.81);Pain Pain - Right/Left: Left Pain - part of body: Ankle and joints of foot  Time: 1243-1305 (PTA left 1250-1300 to print HEP handout) PT Time Calculation (min) (ACUTE ONLY): 22 min  Charges:    $Therapeutic Exercise: 8-22 mins PT General Charges $$ ACUTE PT VISIT: 1 Visit                     Jaidyn Kuhl P., PTA Acute Rehabilitation Services Secure Chat Preferred 9a-5:30pm Office: [phone]    [name] HERO [facility] 04/09/2024, 2:37 PM

## 2024-04-09 NOTE — Consult Note (Signed)
 WOC Nurse Consult Note: Reason for Consult: needs wound vac placed on left foot wound  To be DC 9/15, has home vac in room Wound type: surgical  Pressure Injury POA:NA Measurement: 10cm x 6cm x 0.5cm  Wound bed: clean Drainage (amount, consistency, odor) serosanguinous  Periwound: intact  Dressing procedure/placement/frequency: Removed saline moist gauze dressing placed by vascular service this am Covered wound with___1_ piece of black foam  Used 1/2 of ostomy barrier ring around the adjacent toe to aid in seal of drape.  Sealed NPWT dressing at HG Gauze bolster under tubing, kerlix and ACE wrap placed  Patient received IV AND PO pain medication per bedside nurse prior to dressing change Patient tolerated procedure well; however he is very fixated on pain meds and availability of pain meds at home   WOC nurse will continue to provide NPWT dressing changed due to the complexity of the dressing change.    Gilmar Bua [facility], CNS, CWON-AP [phone]

## 2024-04-10 ENCOUNTER — Ambulatory Visit (INDEPENDENT_AMBULATORY_CARE_PROVIDER_SITE_OTHER): Admitting: Family Medicine

## 2024-04-10 ENCOUNTER — Encounter: Payer: Self-pay | Admitting: Family Medicine

## 2024-04-10 VITALS — BP 113/65 | HR 99 | Ht 74.0 in

## 2024-04-10 DIAGNOSIS — I10 Essential (primary) hypertension: Secondary | ICD-10-CM

## 2024-04-10 DIAGNOSIS — F17200 Nicotine dependence, unspecified, uncomplicated: Secondary | ICD-10-CM | POA: Diagnosis not present

## 2024-04-10 DIAGNOSIS — Z09 Encounter for follow-up examination after completed treatment for conditions other than malignant neoplasm: Secondary | ICD-10-CM

## 2024-04-10 DIAGNOSIS — Z89422 Acquired absence of other left toe(s): Secondary | ICD-10-CM | POA: Diagnosis not present

## 2024-04-10 MED ORDER — NICOTINE 21 MG/24HR TD PT24: 21.0000 mg | MEDICATED_PATCH | Freq: Every day | TRANSDERMAL | 0 refills | Status: DC

## 2024-04-10 MED ORDER — TRAMADOL HCL 50 MG PO TABS: 50.0000 mg | ORAL_TABLET | Freq: Three times a day (TID) | ORAL | 0 refills | Status: AC | PRN

## 2024-04-10 NOTE — Progress Notes (Unsigned)
 Established Patient Office Visit  Subjective    Patient ID: Todd [name], male    DOB: 01/20/1966  Age: 58 y.o. MRN: [id number]  CC:  Chief Complaint  Patient presents with   Hospitalization Follow-up    Follow up on toe amputation. Also wanting some nicotine  pouches and pain medication     HPI Todd [name] presents for follow up after release from hospital after partial foot amputation. Patient reports severe pain. He also would like help to stop smoking.   Outpatient Encounter Medications as of 04/10/2024  Medication Sig   acetaminophen  (TYLENOL ) 325 MG tablet Take 2 tablets (650 mg total) by mouth every 6 (six) hours as needed for mild pain (pain score 1-3) or fever (or Fever >/= 101).   aspirin  EC 81 MG tablet Take 1 tablet (81 mg total) by mouth daily. Swallow whole.   atorvastatin  (LIPITOR) 80 MG tablet Take 1 tablet (80 mg total) by mouth daily.   clopidogrel  (PLAVIX ) 75 MG tablet Take 1 tablet (75 mg total) by mouth daily.   gabapentin  (NEURONTIN ) 300 MG capsule Take 2 capsules (600 mg total) by mouth 3 (three) times daily.   lisinopril  (ZESTRIL ) 20 MG tablet TAKE 1 TABLET BY MOUTH EVERY DAY   nicotine  (NICODERM CQ ) 21 mg/24hr patch Place 1 patch (21 mg total) onto the skin daily.   oxyCODONE  (OXY IR/ROXICODONE ) 5 MG immediate release tablet Take 1 tablet (5 mg total) by mouth every 6 (six) hours as needed for moderate pain (pain score 4-6).   traMADol  (ULTRAM ) 50 MG tablet Take 1 tablet (50 mg total) by mouth every 8 (eight) hours as needed for up to 5 days.   nicotine  (NICODERM CQ  - DOSED IN MG/24 HOURS) 21 mg/24hr patch Place 1 patch (21 mg total) onto the skin daily.   No facility-administered encounter medications on file as of 04/10/2024.    Past Medical History:  Diagnosis Date   Hyperlipidemia    Hypertension    Stroke [facility])     Past Surgical History:  Procedure Laterality Date   ABDOMINAL AORTIC ENDOVASCULAR STENT GRAFT N/A 03/06/2024   Procedure:  INSERTION, ENDOVASCULAR STENT GRAFT, AORTA, ABDOMINAL;LEFT RENAL ARTERY STENT;  Surgeon: Sheree Penne Bruckner, MD;  Location: [facility] OR;  Service: Vascular;  Laterality: N/A;   ABDOMINAL AORTOGRAM W/LOWER EXTREMITY N/A 03/05/2024   Procedure: ABDOMINAL AORTOGRAM W/LOWER EXTREMITY;  Surgeon: Sheree Penne Bruckner, MD;  Location: [facility] INVASIVE CV LAB;  Service: Cardiovascular;  Laterality: N/A;   AMPUTATION Left 03/06/2024   Procedure: AMPUTATION, LEFT GREAT TOE;  Surgeon: Sheree Penne Bruckner, MD;  Location: [facility] OR;  Service: Vascular;  Laterality: Left;   AMPUTATION Left 03/23/2024   Procedure: LEFT GREAT TOE, TRANSMETATARSAL AMPUTATION;  Surgeon: Sheree Penne Bruckner, MD;  Location: [facility] OR;  Service: Vascular;  Laterality: Left;  LEFT TOE AMPUTATION   APPLICATION OF WOUND VAC Left 03/23/2024   Procedure: APPLICATION, WOUND VAC;  Surgeon: Sheree Penne Bruckner, MD;  Location: [facility] OR;  Service: Vascular;  Laterality: Left;   APPLICATION OF WOUND VAC Left 04/05/2024   Procedure: APPLICATION, WOUND VAC;  Surgeon: [name] [name], MD;  Location: [facility] OR;  Service: Vascular;  Laterality: Left;   APPLICATION, SKIN SUBSTITUTE  04/05/2024   Procedure: APPLICATION, SKIN SUBSTITUTE;  Surgeon: [name] [name], MD;  Location: [facility] OR;  Service: Vascular;;   ENDARTERECTOMY FEMORAL Left 03/06/2024   Procedure: ENDARTERECTOMY, LEFT FEMORAL, LEFT SUPERFICIAL FEMORAL ARTERY;  Surgeon: Sheree Penne Bruckner, MD;  Location: [facility] OR;  Service: Vascular;  Laterality: Left;   HERNIA REPAIR     INCISION AND DRAINAGE OF WOUND Left 04/05/2024   Procedure: EXCISIONAL DEBRIDEMENT OF LEFT FOOT, BONE DEBRIDEMENT OF LEFT FIRST METATARSAL;  Surgeon: [name] [name], MD;  Location: [facility] OR;  Service: Vascular;  Laterality: Left;   IRRIGATION AND DEBRIDEMENT FOOT Left 03/23/2024   Procedure: IRRIGATION AND DEBRIDEMENT OF LEFT GREAT TOE AMPUTATION;  Surgeon: Sheree Penne Bruckner, MD;  Location: [facility] OR;  Service: Vascular;   Laterality: Left;   LOWER EXTREMITY INTERVENTION Left 03/05/2024   Procedure: LOWER EXTREMITY INTERVENTION;  Surgeon: Sheree Penne Bruckner, MD;  Location: [facility] INVASIVE CV LAB;  Service: Cardiovascular;  Laterality: Left;   PATCH ANGIOPLASTY Left 03/06/2024   Procedure: ANGIOPLASTY, USING 1cm x 14cm XENOSURE PATCH GRAFT;  Surgeon: Sheree Penne Bruckner, MD;  Location: [facility] OR;  Service: Vascular;  Laterality: Left;   SHOULDER SURGERY Right    TRANSCAROTID ARTERY REVASCULARIZATION  Left 03/02/2024   Procedure: LEFT TRANSCAROTID ARTERY REVASCULARIZATION USING 8 X 40 MM ENROUTE TRANSCAROTID STENT;  Surgeon: Sheree Penne Bruckner, MD;  Location: [facility] OR;  Service: Vascular;  Laterality: Left;   ULTRASOUND GUIDANCE FOR VASCULAR ACCESS Bilateral 03/06/2024   Procedure: ULTRASOUND GUIDANCE, FOR VASCULAR ACCESS;  Surgeon: Sheree Penne Bruckner, MD;  Location: [facility] OR;  Service: Vascular;  Laterality: Bilateral;    Family History  Problem Relation Age of Onset   Cancer Mother    Cancer Father    Colon cancer Neg Hx    Colon polyps Neg Hx    Crohn's disease Neg Hx    Esophageal cancer Neg Hx    Rectal cancer Neg Hx    Stomach cancer Neg Hx    Ulcerative colitis Neg Hx     Social History   Socioeconomic History   Marital status: Divorced    Spouse name: Not on file   Number of children: Not on file   Years of education: Not on file   Highest education level: Not on file  Occupational History   Not on file  Tobacco Use   Smoking status: Former    Current packs/day: 0.33    Average packs/day: 0.3 packs/day for 0.7 years (0.2 ttl pk-yrs)    Types: Cigarettes    Start date: 2025   Smokeless tobacco: Never  Vaping Use   Vaping status: Never Used  Substance and Sexual Activity   Alcohol use: Not Currently    Comment: beer after work   Drug use: No   Sexual activity: Yes  Other Topics Concern   Not on file  Social History Narrative   Not on file   Social Drivers of Health    Financial Resource Strain: Low Risk  (10/05/2022)   Overall Financial Resource Strain (CARDIA)    Difficulty of Paying Living Expenses: Not hard at all  Food Insecurity: No Food Insecurity (04/05/2024)   Hunger Vital Sign    Worried About Running Out of Food in the Last Year: Never true    Ran Out of Food in the Last Year: Never true  Transportation Needs: No Transportation Needs (04/05/2024)   PRAPARE - Administrator, Civil Service (Medical): No    Lack of Transportation (Non-Medical): No  Physical Activity: Sufficiently Active (10/05/2022)   Exercise Vital Sign    Days of Exercise per Week: 7 days    Minutes of Exercise per Session: 40 min  Stress: No Stress Concern Present (04/07/2023)   [employer] of Occupational Health - Occupational  Stress Questionnaire    Feeling of Stress : Not at all  Social Connections: Socially Isolated (10/05/2022)   Social Connection and Isolation Panel    Frequency of Communication with Friends and Family: More than three times a week    Frequency of Social Gatherings with Friends and Family: More than three times a week    Attends Religious Services: Never    Database administrator or Organizations: No    Attends Banker Meetings: Never    Marital Status: Divorced  Catering manager Violence: Not At Risk (04/05/2024)   Humiliation, Afraid, Rape, and Kick questionnaire    Fear of Current or Ex-Partner: No    Emotionally Abused: No    Physically Abused: No    Sexually Abused: No    Review of Systems  All other systems reviewed and are negative.       Objective    BP 113/65   Pulse 99   Ht 6' 2 (1.88 m)   SpO2 98%   BMI 25.42 kg/m   Physical Exam Vitals and nursing note reviewed.  Constitutional:      General: He is not in acute distress. Cardiovascular:     Rate and Rhythm: Normal rate and regular rhythm.  Pulmonary:     Effort: Pulmonary effort is normal.     Breath sounds: Normal breath sounds.   Abdominal:     Palpations: Abdomen is soft.     Tenderness: There is no abdominal tenderness.  Musculoskeletal:     Comments: Left foot dressing dry  Neurological:     General: No focal deficit present.     Mental Status: He is alert and oriented to person, place, and time.         Assessment & Plan:    1. 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 Keep scheduled follow ups. Tramadol  prescribed.   2. Essential hypertension Appears stable;   3. Smoker Nicotine  patches prescribed.   4. Hospital discharge follow-up   No follow-ups on file.   Tanda Raguel SQUIBB, MD

## 2024-04-13 ENCOUNTER — Encounter: Payer: Self-pay | Admitting: Family Medicine

## 2024-04-16 ENCOUNTER — Encounter: Payer: Self-pay | Admitting: Adult Health

## 2024-04-16 ENCOUNTER — Ambulatory Visit (INDEPENDENT_AMBULATORY_CARE_PROVIDER_SITE_OTHER): Admitting: Adult Health

## 2024-04-16 VITALS — BP 101/71 | HR 97 | Ht 74.0 in | Wt 197.0 lb

## 2024-04-16 DIAGNOSIS — I63232 Cerebral infarction due to unspecified occlusion or stenosis of left carotid arteries: Secondary | ICD-10-CM

## 2024-04-16 DIAGNOSIS — E785 Hyperlipidemia, unspecified: Secondary | ICD-10-CM

## 2024-04-16 DIAGNOSIS — Z9862 Peripheral vascular angioplasty status: Secondary | ICD-10-CM | POA: Diagnosis not present

## 2024-04-16 NOTE — Patient Instructions (Signed)
 Your Plan:  Continue ASA and plavix  per vein and vascular  Blood pressure goal <130/90 Cholesterol LDL goal <70 Diabetes goal A1c <7 Monitor diet and try to exercise   Thank you for coming to see us  at [facility]. I hope we have been able to provide you high quality care today.  You may receive a patient satisfaction survey over the next few weeks. We would appreciate your feedback and comments so that we may continue to improve ourselves and the health of our patients.
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
## 2024-04-17 ENCOUNTER — Telehealth: Payer: Self-pay

## 2024-04-17 NOTE — Telephone Encounter (Signed)
 [place], RN [facility] called to report pt has pain during wound vac changes and is asking for pain medication to take prior to changing the vac. Advised [place] to use sterile normal saline to loosen the vac sponge prior to removing it.   [place] requesting to use duoderm on the bottom of pt's foot under the vac drape for skin protection and better seal.  Verbal order given to use duoderm as requested.  [place] also asking for suggestions on what wound cleaner to use.    [place] knows [facility] will call her back tomorrow once pain medication and wound cleanser are discussed with PA.

## 2024-04-18 ENCOUNTER — Encounter (HOSPITAL_COMMUNITY)

## 2024-04-18 ENCOUNTER — Telehealth: Payer: Self-pay

## 2024-04-18 ENCOUNTER — Encounter: Admitting: Vascular Surgery

## 2024-04-18 ENCOUNTER — Other Ambulatory Visit: Payer: Self-pay | Admitting: Physician Assistant

## 2024-04-18 MED ORDER — OXYCODONE HCL 5 MG PO TABS: 5.0000 mg | ORAL_TABLET | Freq: Four times a day (QID) | ORAL | 0 refills | Status: DC | PRN

## 2024-04-18 NOTE — Telephone Encounter (Signed)
 Home Health/Triage/Medication Refill: Returned call to [place], RN @ [facility] in response to yesterday's call.   -ok to use DuoDerm to assist in wound vac adherence -use normal saline to assist in removing wound vac sponge -use Vashe to cleanse wound  -refill of pain medicine to be used for dressing changes only.  Advise pt that no additional refills for pain medicine will be given.

## 2024-04-26 NOTE — Progress Notes (Signed)
 I agree with the above plan

## 2024-04-30 ENCOUNTER — Telehealth: Payer: Self-pay

## 2024-04-30 ENCOUNTER — Telehealth: Payer: Self-pay | Admitting: Family Medicine

## 2024-04-30 NOTE — Telephone Encounter (Signed)
 Copied from CRM [id number]. Topic: Clinical - Medication Refill >> Apr 30, 2024 12:53 PM Selinda RAMAN wrote: Medication: traMADol  (ULTRAM ) 50 MG tablet [[id number]]  Has the patient contacted their pharmacy? No   This is the patient's preferred pharmacy:  [facility] [address], [place] - [address] [place] [address] Phone: [phone] Fax: [phone]   Is this the correct pharmacy for this prescription? Yes If no, delete pharmacy and type the correct one.   Has the prescription been filled recently? Yes a few weeks ago  Is the patient out of the medication? Yes  Has the patient been seen for an appointment in the last year OR does the patient have an upcoming appointment? Yes  Can we respond through MyChart? Yes  Please assist patient further

## 2024-04-30 NOTE — Telephone Encounter (Signed)
 Patient aware and reminded of his appointment with foot doctor/ surgeon.   He stated, Ok, and would see what he says

## 2024-04-30 NOTE — Telephone Encounter (Signed)
 Patient requesting Tramadol , not on profile

## 2024-04-30 NOTE — Telephone Encounter (Signed)
 Patient called asking for refill of Oxycodone . Discussed with patient that refill was made on 04/18/24 and was only to be taken prior to wound-vac changes. Pt stated he knew that but took some when I needed a little extra. Advised patient that no future refills will be made for the Oxycodone . Pt is aware that he has f/u appts on 05/09/24.

## 2024-04-30 NOTE — Telephone Encounter (Signed)
 Sent a quantity of 15 on 04/10/2024 Pls advise on request.

## 2024-05-07 ENCOUNTER — Other Ambulatory Visit (HOSPITAL_COMMUNITY): Payer: Self-pay

## 2024-05-07 ENCOUNTER — Other Ambulatory Visit: Payer: Self-pay | Admitting: Family Medicine

## 2024-05-08 ENCOUNTER — Other Ambulatory Visit (HOSPITAL_COMMUNITY): Payer: Self-pay

## 2024-05-09 ENCOUNTER — Ambulatory Visit (HOSPITAL_BASED_OUTPATIENT_CLINIC_OR_DEPARTMENT_OTHER)
Admit: 2024-05-09 | Discharge: 2024-05-09 | Disposition: A | Discharge status: Home / Self Care | Attending: Vascular Surgery | Admitting: Vascular Surgery

## 2024-05-09 ENCOUNTER — Ambulatory Visit (HOSPITAL_COMMUNITY)
Admission: RE | Admit: 2024-05-09 | Discharge: 2024-05-09 | Disposition: A | Discharge status: Home / Self Care | Source: Ambulatory Visit | Attending: Vascular Surgery | Admitting: Vascular Surgery

## 2024-05-09 ENCOUNTER — Encounter: Payer: Self-pay | Admitting: Vascular Surgery

## 2024-05-09 ENCOUNTER — Other Ambulatory Visit: Payer: Self-pay | Admitting: Vascular Surgery

## 2024-05-09 ENCOUNTER — Ambulatory Visit (INDEPENDENT_AMBULATORY_CARE_PROVIDER_SITE_OTHER): Admitting: Vascular Surgery

## 2024-05-09 ENCOUNTER — Other Ambulatory Visit: Payer: Self-pay | Admitting: Family Medicine

## 2024-05-09 ENCOUNTER — Other Ambulatory Visit (HOSPITAL_COMMUNITY): Payer: Self-pay

## 2024-05-09 VITALS — BP 114/80 | HR 93 | Temp 97.7°F

## 2024-05-09 DIAGNOSIS — T8142XA Infection following a procedure, deep incisional surgical site, initial encounter: Secondary | ICD-10-CM | POA: Insufficient documentation

## 2024-05-09 DIAGNOSIS — I779 Disorder of arteries and arterioles, unspecified: Secondary | ICD-10-CM | POA: Insufficient documentation

## 2024-05-09 DIAGNOSIS — I739 Peripheral vascular disease, unspecified: Secondary | ICD-10-CM | POA: Diagnosis present

## 2024-05-09 LAB — VAS US ABI WITH/WO TBI
Left ABI: 0.86
Right ABI: 0.62

## 2024-05-09 MED ORDER — OXYCODONE HCL 5 MG PO TABS: 5.0000 mg | ORAL_TABLET | Freq: Four times a day (QID) | ORAL | 0 refills | Status: DC | PRN

## 2024-05-09 NOTE — Progress Notes (Signed)
 Subjective:     Patient ID: Todd [name], male   DOB: March 12, 1966, 58 y.o.   MRN: [id number]  HPI patient-year-old male follows up for wound VAC change of left foot.  He was admitted with left-sided  symptomatic carotid disease now status post TCAR as well as EVAR with left common femoral endarterectomy and stenting of the left SFA.  He has had left great toe amputation followed by subsequent left great toe metatarsal amputation followed by subsequent debridement now healing with a wound VAC.  Overall he is recovering well.   Review of Systems As above    Objective:   Physical Exam Vitals:   05/09/24 0943  BP: 114/80  Pulse: 93  Temp: 97.7 F (36.5 C)  SpO2: 97%   Awake alert and oriented 2+ palpable left dorsalis pedis       Right Carotid Findings:  +----------+--------+--------+--------+------------------+-----------------  -+           PSV cm/sEDV cm/sStenosisPlaque DescriptionComments             +----------+--------+--------+--------+------------------+-----------------  -+  CCA Prox  92      22                                intimal  thickening  +----------+--------+--------+--------+------------------+-----------------  -+  CCA Distal74      19                                intimal  thickening  +----------+--------+--------+--------+------------------+-----------------  -+  ICA Prox  370     100     60-79%  heterogenous                           +----------+--------+--------+--------+------------------+-----------------  -+  ICA Mid   60      25                                                     +----------+--------+--------+--------+------------------+-----------------  -+  ICA Distal67      26                                                     +----------+--------+--------+--------+------------------+-----------------  -+  ECA      141     22              heterogenous                            +----------+--------+--------+--------+------------------+-----------------  -+   +----------+--------+-------+---------+-------------------+           PSV cm/sEDV cmsDescribe Arm Pressure (mmHG)  +----------+--------+-------+---------+-------------------+  [id number]    0      Turbulent132                  +----------+--------+-------+---------+-------------------+   +---------+--------+--+--------+--+---------+  VertebralPSV cm/s41EDV cm/s17Antegrade  +---------+--------+--+--------+--+---------+      Left Carotid Findings:  +----------+-------+--------+--------+----------------+--------------------  ---+           PSV  EDV cm/sStenosisPlaque          Comments                            cm/s                   Description                               +----------+-------+--------+--------+----------------+--------------------  ---+  CCA Prox  100    27              heterogenous                              +----------+-------+--------+--------+----------------+--------------------  ---+  CCA Distal82     23                                                        +----------+-------+--------+--------+----------------+--------------------  ---+  ICA Prox  71     28                                                        +----------+-------+--------+--------+----------------+--------------------  ---+  ICA Mid   90     32                                                        +----------+-------+--------+--------+----------------+--------------------  ---+  ICA Distal78     28      1-39%                                             +----------+-------+--------+--------+----------------+--------------------  ---+  ECA      205    34                              turbulent flow near                                                        stent                      +----------+-------+--------+--------+----------------+--------------------  ---+   +----------+--------+--------+--------+-------------------+           PSV cm/sEDV cm/sDescribeArm Pressure (mmHG)  +----------+--------+--------+--------+-------------------+  Subclavian81     0               139                  +----------+--------+--------+--------+-------------------+   +---------+--------+--+--------+--+---------+  VertebralPSV  cm/s45EDV cm/s17Antegrade  +---------+--------+--+--------+--+---------+      Left Stent(s):  +---------------------------+--------+--------+-------------+--------+-----  ---+  CCA distal to ICA mid stentPSV cm/sEDV cm/sStenosis      WaveformComments  +---------------------------+--------+--------+-------------+--------+-----  ---+  Prox to Stent              82      23      <50% stenosis                   +---------------------------+--------+--------+-------------+--------+-----  ---+  Proximal Stent             62      15                                      +---------------------------+--------+--------+-------------+--------+-----  ---+  Mid Stent                  71      28                                      +---------------------------+--------+--------+-------------+--------+-----  ---+  Distal Stent               90      32                                      +---------------------------+--------+--------+-------------+--------+-----  ---+  Distal to Stent            78      28                                      +---------------------------+--------+--------+-------------+--------+-----  ---+             Summary:  Right Carotid: Velocities in the right ICA are consistent with a 60-79%                 stenosis. The ECA appears <50% stenosed.   Left Carotid: Non-hemodynamically significant plaque <50% noted in the  CCA.               Patent left CCA distal to  ICA mid stent seen. <50% stenosis  is               seen within the stent. Elevated velocity is seen at the ECA  origin               near the stent.   Vertebrals:  Bilateral vertebral arteries demonstrate antegrade flow.  Subclavians: Right subclavian artery flow was disturbed. Normal flow               hemodynamics were seen in the left subclavian artery.  Assessment/plan:     58 year old male status post above-noted procedures now with well-healing debridement of the left great toe amputation site.  Continue wound VAC and follow-up in 4 weeks with repeat wound check.  Left carotid stent patent by duplex today and CTA for aorta performed 1 month ago.       Marguriete Wootan C. Sheree, MD [facility] Office: [phone] Pager: [phone]

## 2024-05-11 ENCOUNTER — Encounter (HOSPITAL_COMMUNITY): Payer: Self-pay

## 2024-05-11 ENCOUNTER — Other Ambulatory Visit (HOSPITAL_COMMUNITY): Payer: Self-pay

## 2024-05-30 ENCOUNTER — Encounter: Payer: Self-pay | Admitting: Vascular Surgery

## 2024-05-30 ENCOUNTER — Ambulatory Visit: Attending: Vascular Surgery | Admitting: Vascular Surgery

## 2024-05-30 VITALS — BP 118/77 | HR 99 | Temp 97.9°F | Resp 98

## 2024-05-30 DIAGNOSIS — I7143 Infrarenal abdominal aortic aneurysm, without rupture: Secondary | ICD-10-CM

## 2024-05-30 DIAGNOSIS — I779 Disorder of arteries and arterioles, unspecified: Secondary | ICD-10-CM

## 2024-05-30 DIAGNOSIS — I739 Peripheral vascular disease, unspecified: Secondary | ICD-10-CM

## 2024-05-30 DIAGNOSIS — T8142XA Infection following a procedure, deep incisional surgical site, initial encounter: Secondary | ICD-10-CM

## 2024-05-30 MED ORDER — OXYCODONE-ACETAMINOPHEN 5-325 MG PO TABS: 1.0000 | ORAL_TABLET | ORAL | 0 refills | Status: DC | PRN

## 2024-05-30 NOTE — Progress Notes (Signed)
     Subjective:     Patient ID: Todd [name], male   DOB: [birth year], 58 y.o.   MRN: [id number]  HPI 58 year old male here for wound VAC change of left foot.  Initially presented with symptomatic carotid stenosis status post TCAR and also with left great toe ulceration now status post endovascular aneurysm repair with stenting of the left SFA interpretation.  Continues to have pain at with wound VAC changes but overall healing well.  Home health visiting 3 days/week.   Review of Systems As above    Objective:   Physical Exam Vitals:   05/30/24 0844  BP: 118/77  Pulse: 99  Resp: (!) 98  Temp: 97.9 F (36.6 C)   Left anterior tibial artery pulses palpable Left foot wound healing well with granulation tissue I did debride 2 small areas of necrotic tissue today    Assessment:     58 year old male status post above procedures now with healing left great toe ulceration with continued wound VAC.    Plan:     Wound was debrided at bedside and wound VAC replaced  Follow-up 4 to 6 weeks with wound check     Clayborn Milnes C. Sheree, MD [facility] Office: [phone] Pager: [phone]

## 2024-06-06 ENCOUNTER — Other Ambulatory Visit (HOSPITAL_COMMUNITY): Payer: Self-pay

## 2024-06-14 ENCOUNTER — Telehealth: Payer: Self-pay

## 2024-06-14 NOTE — Telephone Encounter (Signed)
 Medical records requested by [facility] have been sent to [facility]. Confirmed today that [facility] has this request and is working on it. Pt will be made aware.

## 2024-06-15 ENCOUNTER — Other Ambulatory Visit (HOSPITAL_COMMUNITY): Payer: Self-pay

## 2024-06-18 ENCOUNTER — Other Ambulatory Visit (HOSPITAL_COMMUNITY): Payer: Self-pay

## 2024-06-18 ENCOUNTER — Other Ambulatory Visit (HOSPITAL_COMMUNITY): Payer: Self-pay | Admitting: Student

## 2024-06-19 ENCOUNTER — Encounter: Payer: Self-pay | Admitting: Vascular Surgery

## 2024-06-22 ENCOUNTER — Other Ambulatory Visit (HOSPITAL_COMMUNITY): Payer: Self-pay

## 2024-06-25 ENCOUNTER — Encounter (HOSPITAL_COMMUNITY): Payer: Self-pay

## 2024-06-25 ENCOUNTER — Other Ambulatory Visit (HOSPITAL_COMMUNITY): Payer: Self-pay

## 2024-06-25 ENCOUNTER — Ambulatory Visit: Payer: Self-pay | Admitting: Family Medicine

## 2024-06-25 NOTE — Telephone Encounter (Signed)
 Pt scheduled for next day visit for continued medical management.   Reason for Disposition  [1] Prescription refill request for ESSENTIAL medicine (i.e., likelihood of harm to patient if not taken) AND [2] triager unable to refill per department policy  Answer Assessment - Initial Assessment Questions Pt reports he had a stroke and toe amputation. He was prescribed Plavix  75 mg daily in the hospital and advised to f/u with PCP for med management. Ran out of plavix  approximately one week.   Denies any residual neuro deficits from stroke. Had stent in leg and neck. Scheduled soonest available in office appt for med management.  1. DRUG NAME: What medicine do you need to have refilled?     Plavix   2. REFILLS REMAINING: How many refills are remaining? Notes: The label on the medicine or pill bottle will show how many refills are remaining. If there are no refills remaining, then a renewal may be needed.     0  3. PRESCRIBER: Who prescribed it? Note: The prescribing doctor or group is responsible for refill approvals[facility]  4. PHARMACY: Have you contacted your pharmacy (drugstore)? Note: Some pharmacies will contact the doctor (or NP/PA).      Yes, stated needs to see PCP  Protocols used: Medication Refill and Renewal Call-A-AH  Message from [facility] sent at 06/25/2024  1:39 PM EST  Summary: Blood thinner refill   Reason for Triage: Patient calling in for clopidogrel  (PLAVIX ) 75 MG tablet refill, states he was prescribed medication in hospital and tried to refill and was told by pharmacy he had to reach out to provider, but Dr. Tanda isn't the one who originally prescribed, states he forgot to mention medication to her at last visit, patient isn't sure if he's even supposed to continue medication. Patient is completely out.  Tavari [phone]

## 2024-06-25 NOTE — Telephone Encounter (Signed)
 Pt has appt with Amy tomorrow 06/26/24

## 2024-06-26 ENCOUNTER — Encounter: Payer: Self-pay | Admitting: Family

## 2024-06-26 ENCOUNTER — Ambulatory Visit: Admitting: Family

## 2024-06-26 VITALS — BP 123/79 | HR 79 | Temp 97.8°F | Resp 16 | Ht 74.0 in | Wt 192.0 lb

## 2024-06-26 DIAGNOSIS — Z7901 Long term (current) use of anticoagulants: Secondary | ICD-10-CM

## 2024-06-26 MED ORDER — CLOPIDOGREL BISULFATE 75 MG PO TABS: 75.0000 mg | ORAL_TABLET | Freq: Every day | ORAL | 0 refills | Status: DC

## 2024-06-26 NOTE — Progress Notes (Signed)
 Follow up for plavix 

## 2024-06-26 NOTE — Progress Notes (Signed)
 Patient ID: Todd [name], male    DOB: 16-Feb-1966  MRN: [id number]  CC: Chronic Conditions Follow-Up  Subjective: Todd [name] is a 58 y.o. male who presents for chronic conditions follow-up.   His concerns today include:  Patient presents for Plavix  refill, no issues/concerns.   Patient Active Problem List   Diagnosis Date Noted   Peripheral arterial disease 04/05/2024   Osteomyelitis (HCC) 03/25/2024   Dyslipidemia 03/25/2024   Post op infection 03/23/2024   Infrarenal abdominal aortic aneurysm (AAA) without rupture 02/28/2024   PAD (peripheral artery disease) 02/28/2024   Tobacco use disorder 02/28/2024   Pulmonary nodule 02/28/2024   Bilateral carotid artery disease 02/28/2024   CVA (cerebral vascular accident) (HCC) 02/27/2024   Ingrown nail of great toe 01/13/2024   Closed fracture of distal phalanx of great toe 12/13/2023   Contusion of great toe of left foot 11/28/2023   Essential hypertension 05/07/2021     Current Outpatient Medications on File Prior to Visit  Medication Sig Dispense Refill   acetaminophen  (TYLENOL ) 325 MG tablet Take 2 tablets (650 mg total) by mouth every 6 (six) hours as needed for mild pain (pain score 1-3) or fever (or Fever >/= 101).     aspirin  EC 81 MG tablet Take 1 tablet (81 mg total) by mouth daily. Swallow whole.     atorvastatin  (LIPITOR) 80 MG tablet Take 1 tablet (80 mg total) by mouth daily. 30 tablet 5   gabapentin  (NEURONTIN ) 300 MG capsule Take 2 capsules (600 mg total) by mouth 3 (three) times daily. 180 capsule 0   lisinopril  (ZESTRIL ) 20 MG tablet TAKE 1 TABLET BY MOUTH EVERY DAY 90 tablet 0   nicotine  (NICODERM CQ  - DOSED IN MG/24 HOURS) 21 mg/24hr patch PLACE 1 PATCH ONTO THE SKIN DAILY. 28 patch 0   oxyCODONE  (OXY IR/ROXICODONE ) 5 MG immediate release tablet Take 1 tablet (5 mg total) by mouth every 6 (six) hours as needed for severe pain (pain score 7-10). 20 tablet 0   oxyCODONE -acetaminophen  (PERCOCET/ROXICET) 5-325 MG  tablet Take 1 tablet by mouth every 4 (four) hours as needed for severe pain (pain score 7-10). 30 tablet 0   No current facility-administered medications on file prior to visit.    No Known Allergies  Social History   Socioeconomic History   Marital status: Divorced    Spouse name: Not on file   Number of children: Not on file   Years of education: Not on file   Highest education level: GED or equivalent  Occupational History   Not on file  Tobacco Use   Smoking status: Former    Current packs/day: 0.33    Average packs/day: 0.3 packs/day for 0.9 years (0.3 ttl pk-yrs)    Types: Cigarettes    Start date: 2025   Smokeless tobacco: Never  Vaping Use   Vaping status: Never Used  Substance and Sexual Activity   Alcohol use: Not Currently    Comment: beer after work   Drug use: No   Sexual activity: Yes  Other Topics Concern   Not on file  Social History Narrative   Not on file   Social Drivers of Health   Financial Resource Strain: Low Risk  (06/25/2024)   Overall Financial Resource Strain (CARDIA)    Difficulty of Paying Living Expenses: Not very hard  Food Insecurity: No Food Insecurity (06/25/2024)   Hunger Vital Sign    Worried About Running Out of Food in the Last Year: Never true  Ran Out of Food in the Last Year: Never true  Transportation Needs: No Transportation Needs (06/25/2024)   PRAPARE - Administrator, Civil Service (Medical): No    Lack of Transportation (Non-Medical): No  Physical Activity: Inactive (06/25/2024)   Exercise Vital Sign    Days of Exercise per Week: 0 days    Minutes of Exercise per Session: Not on file  Stress: No Stress Concern Present (06/25/2024)   [employer] of Occupational Health - Occupational Stress Questionnaire    Feeling of Stress: Not at all  Social Connections: Moderately Isolated (06/25/2024)   Social Connection and Isolation Panel    Frequency of Communication with Friends and Family: More than three  times a week    Frequency of Social Gatherings with Friends and Family: Once a week    Attends Religious Services: Patient declined    Active Member of Clubs or Organizations: No    Attends Engineer, Structural: Not on file    Marital Status: Married  Intimate Partner Violence: Not At Risk (04/05/2024)   Humiliation, Afraid, Rape, and Kick questionnaire    Fear of Current or Ex-Partner: No    Emotionally Abused: No    Physically Abused: No    Sexually Abused: No    Family History  Problem Relation Age of Onset   Cancer Mother    Cancer Father    Colon cancer Neg Hx    Colon polyps Neg Hx    Crohn's disease Neg Hx    Esophageal cancer Neg Hx    Rectal cancer Neg Hx    Stomach cancer Neg Hx    Ulcerative colitis Neg Hx     Past Surgical History:  Procedure Laterality Date   ABDOMINAL AORTIC ENDOVASCULAR STENT GRAFT N/A 03/06/2024   Procedure: INSERTION, ENDOVASCULAR STENT GRAFT, AORTA, ABDOMINAL;LEFT RENAL ARTERY STENT;  Surgeon: Sheree Penne Bruckner, MD;  Location: [facility] OR;  Service: Vascular;  Laterality: N/A;   ABDOMINAL AORTOGRAM W/LOWER EXTREMITY N/A 03/05/2024   Procedure: ABDOMINAL AORTOGRAM W/LOWER EXTREMITY;  Surgeon: Sheree Penne Bruckner, MD;  Location: [facility] INVASIVE CV LAB;  Service: Cardiovascular;  Laterality: N/A;   AMPUTATION Left 03/06/2024   Procedure: AMPUTATION, LEFT GREAT TOE;  Surgeon: Sheree Penne Bruckner, MD;  Location: [facility] OR;  Service: Vascular;  Laterality: Left;   AMPUTATION Left 03/23/2024   Procedure: LEFT GREAT TOE, TRANSMETATARSAL AMPUTATION;  Surgeon: Sheree Penne Bruckner, MD;  Location: [facility] OR;  Service: Vascular;  Laterality: Left;  LEFT TOE AMPUTATION   APPLICATION OF WOUND VAC Left 03/23/2024   Procedure: APPLICATION, WOUND VAC;  Surgeon: Sheree Penne Bruckner, MD;  Location: [facility] OR;  Service: Vascular;  Laterality: Left;   APPLICATION OF WOUND VAC Left 04/05/2024   Procedure: APPLICATION, WOUND VAC;  Surgeon: [name] [name], MD;  Location: [facility] OR;  Service: Vascular;  Laterality: Left;   APPLICATION, SKIN SUBSTITUTE  04/05/2024   Procedure: APPLICATION, SKIN SUBSTITUTE;  Surgeon: [name] [name], MD;  Location: [facility] OR;  Service: Vascular;;   ENDARTERECTOMY FEMORAL Left 03/06/2024   Procedure: ENDARTERECTOMY, LEFT FEMORAL, LEFT SUPERFICIAL FEMORAL ARTERY;  Surgeon: Sheree Penne Bruckner, MD;  Location: [facility] OR;  Service: Vascular;  Laterality: Left;   HERNIA REPAIR     INCISION AND DRAINAGE OF WOUND Left 04/05/2024   Procedure: EXCISIONAL DEBRIDEMENT OF LEFT FOOT, BONE DEBRIDEMENT OF LEFT FIRST METATARSAL;  Surgeon: [name] [name], MD;  Location: [facility] OR;  Service: Vascular;  Laterality: Left;   IRRIGATION AND DEBRIDEMENT FOOT Left  03/23/2024   Procedure: IRRIGATION AND DEBRIDEMENT OF LEFT GREAT TOE AMPUTATION;  Surgeon: Sheree Penne Bruckner, MD;  Location: [facility] OR;  Service: Vascular;  Laterality: Left;   LOWER EXTREMITY INTERVENTION Left 03/05/2024   Procedure: LOWER EXTREMITY INTERVENTION;  Surgeon: Sheree Penne Bruckner, MD;  Location: [facility] INVASIVE CV LAB;  Service: Cardiovascular;  Laterality: Left;   PATCH ANGIOPLASTY Left 03/06/2024   Procedure: ANGIOPLASTY, USING 1cm x 14cm XENOSURE PATCH GRAFT;  Surgeon: Sheree Penne Bruckner, MD;  Location: [facility] OR;  Service: Vascular;  Laterality: Left;   SHOULDER SURGERY Right    TRANSCAROTID ARTERY REVASCULARIZATION  Left 03/02/2024   Procedure: LEFT TRANSCAROTID ARTERY REVASCULARIZATION USING 8 X 40 MM ENROUTE TRANSCAROTID STENT;  Surgeon: Sheree Penne Bruckner, MD;  Location: [facility] OR;  Service: Vascular;  Laterality: Left;   ULTRASOUND GUIDANCE FOR VASCULAR ACCESS Bilateral 03/06/2024   Procedure: ULTRASOUND GUIDANCE, FOR VASCULAR ACCESS;  Surgeon: Sheree Penne Bruckner, MD;  Location: [facility] OR;  Service: Vascular;  Laterality: Bilateral;    ROS: Review of Systems Negative except as stated above  PHYSICAL EXAM: BP 123/79   Pulse 79   Temp 97.8 F (36.6  C) (Oral)   Resp 16   Ht 6' 2 (1.88 m)   Wt 192 lb (87.1 kg)   SpO2 98%   BMI 24.65 kg/m   Physical Exam HENT:     Head: Normocephalic and atraumatic.     Nose: Nose normal.     Mouth/Throat:     Mouth: Mucous membranes are moist.     Pharynx: Oropharynx is clear.  Eyes:     Extraocular Movements: Extraocular movements intact.     Conjunctiva/sclera: Conjunctivae normal.     Pupils: Pupils are equal, round, and reactive to light.  Cardiovascular:     Rate and Rhythm: Normal rate and regular rhythm.     Pulses: Normal pulses.     Heart sounds: Normal heart sounds.  Pulmonary:     Effort: Pulmonary effort is normal.     Breath sounds: Normal breath sounds.  Musculoskeletal:        General: Normal range of motion.     Cervical back: Normal range of motion and neck supple.  Neurological:     General: No focal deficit present.     Mental Status: He is alert and oriented to person, place, and time.  Psychiatric:        Mood and Affect: Mood normal.        Behavior: Behavior normal.      ASSESSMENT AND PLAN: Note - I discussed plan of care with supervising physician and patient's primary provider Raguel Blush, MD.  1. Hx of long term use of blood thinners (Primary) - Continue Clopidogrel  as prescribed. Counseled on medication adherence/adverse effects. - Follow-up with Raguel Blush, MD in 4 weeks or sooner if needed.  - clopidogrel  (PLAVIX ) 75 MG tablet; Take 1 tablet (75 mg total) by mouth daily.  Dispense: 30 tablet; Refill: 0   Patient was given the opportunity to ask questions.  Patient verbalized understanding of the plan and was able to repeat key elements of the plan. Patient was given clear instructions to go to Emergency Department or return to medical center if symptoms don't improve, worsen, or new problems develop.The patient verbalized understanding.    Requested Prescriptions   Signed Prescriptions Disp Refills   clopidogrel  (PLAVIX ) 75 MG tablet 30  tablet 0    Sig: Take 1 tablet (75 mg total) by mouth daily.  Return for Follow-Up or next available with Raguel Blush, MD.  Greig JINNY Chute, NP

## 2024-07-02 ENCOUNTER — Telehealth: Payer: Self-pay

## 2024-07-02 NOTE — Telephone Encounter (Signed)
 Tammy Hillard, RN with [facility] called reporting necrotic slough on left great toe amp site.  Patient's wound check appt moved up one week to 07/04/24

## 2024-07-04 ENCOUNTER — Other Ambulatory Visit (HOSPITAL_COMMUNITY): Payer: Self-pay

## 2024-07-04 ENCOUNTER — Telehealth: Payer: Self-pay

## 2024-07-04 ENCOUNTER — Encounter: Payer: Self-pay | Admitting: Vascular Surgery

## 2024-07-04 ENCOUNTER — Ambulatory Visit: Attending: Vascular Surgery | Admitting: Physician Assistant

## 2024-07-04 VITALS — BP 175/69 | HR 96 | Temp 98.2°F | Wt 192.0 lb

## 2024-07-04 DIAGNOSIS — T8142XA Infection following a procedure, deep incisional surgical site, initial encounter: Secondary | ICD-10-CM | POA: Insufficient documentation

## 2024-07-04 DIAGNOSIS — I739 Peripheral vascular disease, unspecified: Secondary | ICD-10-CM | POA: Insufficient documentation

## 2024-07-04 MED ORDER — CEPHALEXIN 500 MG PO CAPS
500.0000 mg | ORAL_CAPSULE | Freq: Three times a day (TID) | ORAL | 0 refills | Status: AC
  Filled 2024-07-04: qty 21, 7d supply, fill #0

## 2024-07-04 MED ORDER — OXYCODONE-ACETAMINOPHEN 5-325 MG PO TABS
1.0000 | ORAL_TABLET | Freq: Four times a day (QID) | ORAL | 0 refills | Status: DC | PRN
  Filled 2024-07-04: qty 20, 5d supply, fill #0

## 2024-07-04 NOTE — Progress Notes (Signed)
 Office Note     CC:  follow up Requesting Provider:  Tanda Bleacher, MD  HPI: Todd [name] is a 58 y.o. (Nov 24, 1965) male who was added to clinic as an urgent triage visit due to concern for nonhealing left great toe amputation.  He is well-known to [facility] having had numerous procedures including left TCAR for asymptomatic carotid stenosis on 03/02/2024.  He then underwent aortogram with stenting of bilateral common iliac arteries as well as lithotripsy and stenting of the left SFA due to critical limb ischemia with great toe gangrene on 03/05/2024.  The following day he underwent endovascular repair of abdominal aortic aneurysm including left common femoral endarterectomy with bovine patch angioplasty left SFA stenting, left renal artery stenting.  He was brought back to the OR on 03/23/2024 for left great toe amputation.  He required a return trip on 04/05/2024 for debridement of toe amputation site.  He is receiving home health wound VAC changes to his left great toe amputation site 3 times per week.  Home health was concerned about the appearance and odor of his wound.  He is on aspirin , Plavix , statin daily.  He denies tobacco use.  He however is on nicotine .  He denies any fevers, chills, nausea/vomiting.   Past Medical History:  Diagnosis Date   Hyperlipidemia    Hypertension    Stroke [facility])     Past Surgical History:  Procedure Laterality Date   ABDOMINAL AORTIC ENDOVASCULAR STENT GRAFT N/A 03/06/2024   Procedure: INSERTION, ENDOVASCULAR STENT GRAFT, AORTA, ABDOMINAL;LEFT RENAL ARTERY STENT;  Surgeon: Sheree Penne Bruckner, MD;  Location: [facility] OR;  Service: Vascular;  Laterality: N/A;   ABDOMINAL AORTOGRAM W/LOWER EXTREMITY N/A 03/05/2024   Procedure: ABDOMINAL AORTOGRAM W/LOWER EXTREMITY;  Surgeon: Sheree Penne Bruckner, MD;  Location: [facility] INVASIVE CV LAB;  Service: Cardiovascular;  Laterality: N/A;   AMPUTATION Left 03/06/2024   Procedure: AMPUTATION, LEFT GREAT TOE;  Surgeon: Sheree Penne Bruckner, MD;  Location: [facility] OR;  Service: Vascular;  Laterality: Left;   AMPUTATION Left 03/23/2024   Procedure: LEFT GREAT TOE, TRANSMETATARSAL AMPUTATION;  Surgeon: Sheree Penne Bruckner, MD;  Location: [facility] OR;  Service: Vascular;  Laterality: Left;  LEFT TOE AMPUTATION   APPLICATION OF WOUND VAC Left 03/23/2024   Procedure: APPLICATION, WOUND VAC;  Surgeon: Sheree Penne Bruckner, MD;  Location: [facility] OR;  Service: Vascular;  Laterality: Left;   APPLICATION OF WOUND VAC Left 04/05/2024   Procedure: APPLICATION, WOUND VAC;  Surgeon: [name] [name], MD;  Location: [facility] OR;  Service: Vascular;  Laterality: Left;   APPLICATION, SKIN SUBSTITUTE  04/05/2024   Procedure: APPLICATION, SKIN SUBSTITUTE;  Surgeon: [name] [name], MD;  Location: [facility] OR;  Service: Vascular;;   ENDARTERECTOMY FEMORAL Left 03/06/2024   Procedure: ENDARTERECTOMY, LEFT FEMORAL, LEFT SUPERFICIAL FEMORAL ARTERY;  Surgeon: Sheree Penne Bruckner, MD;  Location: [facility] OR;  Service: Vascular;  Laterality: Left;   HERNIA REPAIR     INCISION AND DRAINAGE OF WOUND Left 04/05/2024   Procedure: EXCISIONAL DEBRIDEMENT OF LEFT FOOT, BONE DEBRIDEMENT OF LEFT FIRST METATARSAL;  Surgeon: [name] [name], MD;  Location: [facility] OR;  Service: Vascular;  Laterality: Left;   IRRIGATION AND DEBRIDEMENT FOOT Left 03/23/2024   Procedure: IRRIGATION AND DEBRIDEMENT OF LEFT GREAT TOE AMPUTATION;  Surgeon: Sheree Penne Bruckner, MD;  Location: [facility] OR;  Service: Vascular;  Laterality: Left;   LOWER EXTREMITY INTERVENTION Left 03/05/2024   Procedure: LOWER EXTREMITY INTERVENTION;  Surgeon: Sheree Penne Bruckner, MD;  Location: [facility] INVASIVE CV LAB;  Service: Cardiovascular;  Laterality: Left;   PATCH ANGIOPLASTY Left 03/06/2024   Procedure: ANGIOPLASTY, USING 1cm x 14cm XENOSURE PATCH GRAFT;  Surgeon: Sheree Penne Bruckner, MD;  Location: [facility] OR;  Service: Vascular;  Laterality: Left;   SHOULDER SURGERY Right    TRANSCAROTID ARTERY  REVASCULARIZATION  Left 03/02/2024   Procedure: LEFT TRANSCAROTID ARTERY REVASCULARIZATION USING 8 X 40 MM ENROUTE TRANSCAROTID STENT;  Surgeon: Sheree Penne Bruckner, MD;  Location: [facility] OR;  Service: Vascular;  Laterality: Left;   ULTRASOUND GUIDANCE FOR VASCULAR ACCESS Bilateral 03/06/2024   Procedure: ULTRASOUND GUIDANCE, FOR VASCULAR ACCESS;  Surgeon: Sheree Penne Bruckner, MD;  Location: [facility] OR;  Service: Vascular;  Laterality: Bilateral;    Social History   Socioeconomic History   Marital status: Divorced    Spouse name: Not on file   Number of children: Not on file   Years of education: Not on file   Highest education level: GED or equivalent  Occupational History   Not on file  Tobacco Use   Smoking status: Former    Current packs/day: 0.33    Average packs/day: 0.3 packs/day for 0.9 years (0.3 ttl pk-yrs)    Types: Cigarettes    Start date: 2025   Smokeless tobacco: Never  Vaping Use   Vaping status: Never Used  Substance and Sexual Activity   Alcohol use: Not Currently    Comment: beer after work   Drug use: No   Sexual activity: Yes  Other Topics Concern   Not on file  Social History Narrative   Not on file   Social Drivers of Health   Financial Resource Strain: Low Risk  (06/25/2024)   Overall Financial Resource Strain (CARDIA)    Difficulty of Paying Living Expenses: Not very hard  Food Insecurity: No Food Insecurity (06/25/2024)   Hunger Vital Sign    Worried About Running Out of Food in the Last Year: Never true    Ran Out of Food in the Last Year: Never true  Transportation Needs: No Transportation Needs (06/25/2024)   PRAPARE - Administrator, Civil Service (Medical): No    Lack of Transportation (Non-Medical): No  Physical Activity: Inactive (06/25/2024)   Exercise Vital Sign    Days of Exercise per Week: 0 days    Minutes of Exercise per Session: Not on file  Stress: No Stress Concern Present (06/25/2024)   [employer] of  Occupational Health - Occupational Stress Questionnaire    Feeling of Stress: Not at all  Social Connections: Moderately Isolated (06/25/2024)   Social Connection and Isolation Panel    Frequency of Communication with Friends and Family: More than three times a week    Frequency of Social Gatherings with Friends and Family: Once a week    Attends Religious Services: Patient declined    Database Administrator or Organizations: No    Attends Engineer, Structural: Not on file    Marital Status: Married  Catering Manager Violence: Not At Risk (04/05/2024)   Humiliation, Afraid, Rape, and Kick questionnaire    Fear of Current or Ex-Partner: No    Emotionally Abused: No    Physically Abused: No    Sexually Abused: No    Family History  Problem Relation Age of Onset   Cancer Mother    Cancer Father    Colon cancer Neg Hx    Colon polyps Neg Hx    Crohn's disease Neg Hx    Esophageal cancer Neg Hx  Rectal cancer Neg Hx    Stomach cancer Neg Hx    Ulcerative colitis Neg Hx     Current Outpatient Medications  Medication Sig Dispense Refill   cephALEXin  (KEFLEX ) 500 MG capsule Take 1 capsule (500 mg total) by mouth 3 (three) times daily. 21 capsule 0   acetaminophen  (TYLENOL ) 325 MG tablet Take 2 tablets (650 mg total) by mouth every 6 (six) hours as needed for mild pain (pain score 1-3) or fever (or Fever >/= 101).     aspirin  EC 81 MG tablet Take 1 tablet (81 mg total) by mouth daily. Swallow whole.     atorvastatin  (LIPITOR) 80 MG tablet Take 1 tablet (80 mg total) by mouth daily. 30 tablet 5   clopidogrel  (PLAVIX ) 75 MG tablet Take 1 tablet (75 mg total) by mouth daily. 30 tablet 0   gabapentin  (NEURONTIN ) 300 MG capsule Take 2 capsules (600 mg total) by mouth 3 (three) times daily. 180 capsule 0   lisinopril  (ZESTRIL ) 20 MG tablet TAKE 1 TABLET BY MOUTH EVERY DAY 90 tablet 0   nicotine  (NICODERM CQ  - DOSED IN MG/24 HOURS) 21 mg/24hr patch PLACE 1 PATCH ONTO THE SKIN DAILY.  28 patch 0   oxyCODONE -acetaminophen  (PERCOCET/ROXICET) 5-325 MG tablet Take 1 tablet by mouth every 6 (six) hours as needed for severe pain (pain score 7-10). 20 tablet 0   No current facility-administered medications for this visit.    No Known Allergies   REVIEW OF SYSTEMS:  Negative unless noted in HPI [X]  denotes positive finding, [ ]  denotes negative finding Cardiac  Comments:  Chest pain or chest pressure:    Shortness of breath upon exertion:    Short of breath when lying flat:    Irregular heart rhythm:        Vascular    Pain in calf, thigh, or hip brought on by ambulation:    Pain in feet at night that wakes you up from your sleep:     Blood clot in your veins:    Leg swelling:         Pulmonary    Oxygen at home:    Productive cough:     Wheezing:         Neurologic    Sudden weakness in arms or legs:     Sudden numbness in arms or legs:     Sudden onset of difficulty speaking or slurred speech:    Temporary loss of vision in one eye:     Problems with dizziness:         Gastrointestinal    Blood in stool:     Vomited blood:         Genitourinary    Burning when urinating:     Blood in urine:        Psychiatric    Major depression:         Hematologic    Bleeding problems:    Problems with blood clotting too easily:        Skin    Rashes or ulcers:        Constitutional    Fever or chills:      PHYSICAL EXAMINATION:  Vitals:   07/04/24 0952  BP: (!) 175/69  Pulse: 96  Temp: 98.2 F (36.8 C)  TempSrc: Temporal  Weight: 192 lb (87.1 kg)    General:  WDWN in NAD; vital signs documented above Gait: Not observed HENT: WNL, normocephalic Pulmonary: normal non-labored breathing Cardiac: regular  HR Abdomen: soft, NT, no masses Skin: without rashes Vascular Exam/Pulses: 2+ palpable left DP pulse Extremities: Wound bed appears to have bleeding granulation tissue however does not appear beefy red; wound bed pictured  below Musculoskeletal: no muscle wasting or atrophy  Neurologic: A&O X 3 Psychiatric:  The pt has Normal affect.     ASSESSMENT/PLAN:: 58 y.o. male added to clinic schedule as an urgent triage visit due to concern for left great toe amputation site infection  Left leg is well-perfused with a palpable DP pulse.  Wound bed was debrided some with forceps and scissors as much as tolerable today.  I do not see an obvious infection however will start 7 days of Keflex .  We will hold wound VAC for now due to irritated skin around the wound and due to the odor likely related to lack of hygiene with the wound VAC.  Encouraged the patient to wash his foot with antibacterial soap and water on a daily basis.  He will perform wet-to-dry dressing changes.  Would appreciate ongoing home health RN for wound assessment at least weekly.  He will keep his appointment in 1 week.  If needed we may add Santyl to debride the wound bed.  I have also refilled his pain medication.  He will notify the office with any questions or concerns.   Donnice Sender, PA-C [facility] [phone]  Clinic MD:   Sheree

## 2024-07-04 NOTE — Telephone Encounter (Signed)
 Leonor Finder, RN with [facility] called to clarify pt's wound care order.  Faxed [employer], (PA) note to [facility]  per [facility] request.  We will hold wound VAC for now due to irritated skin around the wound and due to the odor likely related to lack of hygiene with the wound VAC.  Encouraged the patient to wash his foot with antibacterial soap and water on a daily basis.  He will perform wet-to-dry dressing changes.  Would appreciate ongoing home health RN for wound assessment at least weekly.  He will keep his appointment in 1 week.  If needed we may add Santyl to debride the wound bed.  I have also refilled his pain medication.  He will notify the office with any questions or concerns.

## 2024-07-06 ENCOUNTER — Other Ambulatory Visit: Payer: Self-pay | Admitting: Family Medicine

## 2024-07-09 NOTE — Telephone Encounter (Signed)
 Opened in error

## 2024-07-11 ENCOUNTER — Other Ambulatory Visit (HOSPITAL_COMMUNITY): Payer: Self-pay

## 2024-07-11 ENCOUNTER — Ambulatory Visit

## 2024-07-11 ENCOUNTER — Other Ambulatory Visit: Payer: Self-pay | Admitting: Physician Assistant

## 2024-07-11 VITALS — BP 125/67 | HR 98 | Temp 98.2°F | Wt 192.0 lb

## 2024-07-11 DIAGNOSIS — I739 Peripheral vascular disease, unspecified: Secondary | ICD-10-CM | POA: Insufficient documentation

## 2024-07-11 DIAGNOSIS — S91109A Unspecified open wound of unspecified toe(s) without damage to nail, initial encounter: Secondary | ICD-10-CM | POA: Insufficient documentation

## 2024-07-11 MED ORDER — OXYCODONE-ACETAMINOPHEN 5-325 MG PO TABS
1.0000 | ORAL_TABLET | Freq: Four times a day (QID) | ORAL | 0 refills | Status: DC | PRN
  Filled 2024-07-11: qty 20, 5d supply, fill #0

## 2024-07-12 ENCOUNTER — Encounter: Payer: Self-pay | Admitting: Vascular Surgery

## 2024-07-12 NOTE — Progress Notes (Signed)
 Office Note   History of Present Illness   Todd [name] is a 58 y.o. (1965-12-24) male who presents for wound check. He is well-known to [facility] having had numerous procedures including left TCAR for asymptomatic carotid stenosis on 03/02/2024. He then underwent aortogram with stenting of bilateral common iliac arteries as well as lithotripsy and stenting of the left SFA due to critical limb ischemia with great toe gangrene on 03/05/2024. The following day he underwent endovascular repair of abdominal aortic aneurysm including left common femoral endarterectomy with bovine patch angioplasty left SFA stenting, left renal artery stenting. He was brought back to the OR on 03/23/2024 for left great toe amputation. He required a return trip on 04/05/2024 for debridement of toe amputation site.   He returns today for follow-up.  He says that his left great toe amputation site is healing in well and no longer has a foul odor.  He is doing wet-to-dry dressing changes to this wound daily.  Home health is coming to check on his wound once a week.  He denies any fevers or chills.  He is taking his aspirin , Plavix , and statin daily.  Current Outpatient Medications  Medication Sig Dispense Refill   aspirin  EC 81 MG tablet Take 1 tablet (81 mg total) by mouth daily. Swallow whole.     atorvastatin  (LIPITOR) 80 MG tablet Take 1 tablet (80 mg total) by mouth daily. 30 tablet 5   cephALEXin  (KEFLEX ) 500 MG capsule Take 1 capsule (500 mg total) by mouth 3 (three) times daily. 21 capsule 0   clopidogrel  (PLAVIX ) 75 MG tablet Take 1 tablet (75 mg total) by mouth daily. 30 tablet 0   gabapentin  (NEURONTIN ) 300 MG capsule Take 2 capsules (600 mg total) by mouth 3 (three) times daily. 180 capsule 0   lisinopril  (ZESTRIL ) 20 MG tablet TAKE 1 TABLET BY MOUTH EVERY DAY 90 tablet 0   nicotine  (NICODERM CQ  - DOSED IN MG/24 HOURS) 21 mg/24hr patch PLACE 1 PATCH ONTO THE SKIN DAILY. 28 patch 0   oxyCODONE -acetaminophen   (PERCOCET/ROXICET) 5-325 MG tablet Take 1 tablet by mouth every 6 (six) hours as needed for severe pain (pain score 7-10). 20 tablet 0   No current facility-administered medications for this visit.    REVIEW OF SYSTEMS (negative unless checked):   Cardiac:  []  Chest pain or chest pressure? []  Shortness of breath upon activity? []  Shortness of breath when lying flat? []  Irregular heart rhythm?  Vascular:  []  Pain in calf, thigh, or hip brought on by walking? []  Pain in feet at night that wakes you up from your sleep? []  Blood clot in your veins? []  Leg swelling?  Pulmonary:  []  Oxygen at home? []  Productive cough? []  Wheezing?  Neurologic:  []  Sudden weakness in arms or legs? []  Sudden numbness in arms or legs? []  Sudden onset of difficult speaking or slurred speech? []  Temporary loss of vision in one eye? []  Problems with dizziness?  Gastrointestinal:  []  Blood in stool? []  Vomited blood?  Genitourinary:  []  Burning when urinating? []  Blood in urine?  Psychiatric:  []  Major depression  Hematologic:  []  Bleeding problems? []  Problems with blood clotting?  Dermatologic:  []  Rashes or ulcers?  Constitutional:  []  Fever or chills?  Ear/Nose/Throat:  []  Change in hearing? []  Nose bleeds? []  Sore throat?  Musculoskeletal:  []  Back pain? []  Joint pain? []  Muscle pain?   Physical Examination   Vitals:   07/11/24 0940  BP: 125/67  Pulse: 98  Temp: 98.2 F (36.8 C)  TempSrc: Temporal  Weight: 192 lb (87.1 kg)   Body mass index is 24.65 kg/m.  General:  WDWN in NAD; vital signs documented above Gait: Not observed HENT: WNL, normocephalic Pulmonary: normal non-labored breathing  Cardiac: regular Abdomen: soft, NT, no masses Skin: without rashes Vascular Exam/Pulses: 2+ left DP pulse Extremities: Healthy appearing left great toe amputation site with good granulation tissue Musculoskeletal: no muscle wasting or atrophy  Neurologic: A&O X 3;  No  focal weakness or paresthesias are detected Psychiatric:  The pt has Normal affect.     Medical Decision Making   Todd [name] is a 58 y.o. male who presents for wound check  The patient is well-known to our service with several recent vascular interventions this year including left TCAR on 03/02/2024, bilateral common iliac artery and left SFA stenting on 03/05/2024, endovascular AAA repair with left common femoral endarterectomy on 03/06/2024, left great toe amputation on 03/23/2024, and debridement of left great toe amputation site on 04/05/2024 His wound VAC to the left great toe amputation site was discontinued at his last office visit.  He was also placed on a course of antibiotics for concern of cellulitis.  He has been doing wet-to-dry dressing changes to his wound bed.  He denies any fevers or chills.  He denies any purulent drainage. His left great toe amputation site appears much healthier today with mild fibrinous tissue in the wound bed.  There are no active signs of infection.  His left lower extremity remains well-perfused with a palpable DP pulse He will continue daily wet-to-dry dressing changes to his left great toe amputation site until it heals.  He can return to clinic in 2 to 3 weeks for repeat wound check  Ahmed Holster PA-C [facility] [place] Office: [phone]  Clinic MD: Sheree

## 2024-07-17 ENCOUNTER — Other Ambulatory Visit (HOSPITAL_COMMUNITY): Payer: Self-pay

## 2024-07-23 NOTE — Progress Notes (Unsigned)
" °  POST OPERATIVE OFFICE NOTE    CC:  F/u for surgery  HPI:  This is a 58 y.o. male who presents for follow up wound check for left great toe amputation wound. He is doing wet-to-dry dressing changes to this wound daily. Home health is coming to check on his wound once a week. He denies any fevers or chills. He is taking his aspirin , Plavix , and statin daily.   Today he is overall doing well. He reports getting poison oak on his foot trying to flip breaker switch at his house in the dark outside. Apparently did not have foot covered or sock. Otherwise still reports area on foot is very tender and intermittent pains. Ambulating well. Suppose to go back to work on 07/30/24  He has had several vascular surgery interventions as follows: - left TCAR for asymptomatic carotid stenosis 03/02/2024 - aortogram with stenting of bilateral common iliac arteries as well as lithotripsy and stenting of the left SFA due to critical limb ischemia with great toe gangrene on 03/05/2024.  - endovascular repair of abdominal aortic aneurysm including left common femoral endarterectomy with bovine patch angioplasty left SFA stenting, left renal artery stenting 03/06/24 - left great toe amputation 03/23/24 - debridement of toe amputation site 04/05/2024   Allergies[1]  Current Outpatient Medications  Medication Sig Dispense Refill   aspirin  EC 81 MG tablet Take 1 tablet (81 mg total) by mouth daily. Swallow whole.     atorvastatin  (LIPITOR) 80 MG tablet Take 1 tablet (80 mg total) by mouth daily. 30 tablet 5   cephALEXin  (KEFLEX ) 500 MG capsule Take 1 capsule (500 mg total) by mouth 3 (three) times daily. 21 capsule 0   clopidogrel  (PLAVIX ) 75 MG tablet Take 1 tablet (75 mg total) by mouth daily. 30 tablet 0   gabapentin  (NEURONTIN ) 300 MG capsule Take 2 capsules (600 mg total) by mouth 3 (three) times daily. 180 capsule 0   lisinopril  (ZESTRIL ) 20 MG tablet TAKE 1 TABLET BY MOUTH EVERY DAY 90 tablet 0   nicotine   (NICODERM CQ  - DOSED IN MG/24 HOURS) 21 mg/24hr patch PLACE 1 PATCH ONTO THE SKIN DAILY. 28 patch 0   oxyCODONE -acetaminophen  (PERCOCET/ROXICET) 5-325 MG tablet Take 1 tablet by mouth every 6 (six) hours as needed for severe pain (pain score 7-10). 20 tablet 0   No current facility-administered medications for this visit.     ROS:  See HPI  Physical Exam: Vitals:   07/24/24 0846  BP: 129/80    Incision:  left medial foot wound as shown below. Healthy appearing granulation tissue in wound bed   Extremities:  Doppler Pero and DP signals. Left foot warm and well perfused Neuro: alert and oriented  Assessment/Plan:  This is a 58 y.o. male who is s/p: left great toe amputation 03/23/24 followed by debridement of toe amputation site 04/05/2024. Toe  amputation site is healing slowly. Very healthy appearing tissue in wound bed. LLE remains well perfused and warm with brisk Pero and DP signals. - continue wet to dry dressing changes daily - Work not to return with light duty and permission to sit as needed provided to patient - last refill for Percocet sent to Pharmacy.  He understands no further refills on Percocet - he will return in 4-6 weeks with Aorto/ iliac duplex and LLE arterial duplex  Curry Damme, [facility] [phone]   Clinic MD:  [name]      [1] No Known Allergies  "

## 2024-07-24 ENCOUNTER — Encounter: Payer: Self-pay | Admitting: Physician Assistant

## 2024-07-24 ENCOUNTER — Ambulatory Visit: Attending: Vascular Surgery | Admitting: Physician Assistant

## 2024-07-24 ENCOUNTER — Other Ambulatory Visit: Payer: Self-pay | Admitting: Family Medicine

## 2024-07-24 ENCOUNTER — Other Ambulatory Visit (HOSPITAL_COMMUNITY): Payer: Self-pay

## 2024-07-24 ENCOUNTER — Encounter: Payer: Self-pay | Admitting: Vascular Surgery

## 2024-07-24 VITALS — BP 129/80 | Wt 195.0 lb

## 2024-07-24 DIAGNOSIS — I739 Peripheral vascular disease, unspecified: Secondary | ICD-10-CM | POA: Insufficient documentation

## 2024-07-24 DIAGNOSIS — Z7901 Long term (current) use of anticoagulants: Secondary | ICD-10-CM

## 2024-07-24 DIAGNOSIS — S91109A Unspecified open wound of unspecified toe(s) without damage to nail, initial encounter: Secondary | ICD-10-CM | POA: Insufficient documentation

## 2024-07-24 MED ORDER — OXYCODONE-ACETAMINOPHEN 5-325 MG PO TABS
1.0000 | ORAL_TABLET | Freq: Four times a day (QID) | ORAL | 0 refills | Status: AC | PRN
  Filled 2024-07-24: qty 20, 5d supply, fill #0

## 2024-07-24 NOTE — Telephone Encounter (Unsigned)
 Copied from CRM #[id number]. Topic: Clinical - Medication Refill >> Jul 24, 2024 11:28 AM Cynthia K wrote: Medication:  clopidogrel  (PLAVIX ) 75 MG tablet  Has the patient contacted their pharmacy? Yes (Agent: If no, request that the patient contact the pharmacy for the refill. If patient does not wish to contact the pharmacy document the reason why and proceed with request.) (Agent: If yes, when and what did the pharmacy advise?) Pharmacy needs order to refill  This is the patient's preferred pharmacy:  [id number] - [place], [place] - [address] [place] [address] Phone: [phone] Fax: [phone]  Is this the correct pharmacy for this prescription? Yes If no, delete pharmacy and type the correct one.   Has the prescription been filled recently? No  Is the patient out of the medication? Yes - 1 tablet left for tomorrow  Has the patient been seen for an appointment in the last year OR does the patient have an upcoming appointment? Yes  Can we respond through MyChart? Yes  Agent: Please be advised that Rx refills may take up to 3 business days. We ask that you follow-up with your pharmacy.

## 2024-07-25 MED ORDER — CLOPIDOGREL BISULFATE 75 MG PO TABS: 75.0000 mg | ORAL_TABLET | Freq: Every day | ORAL | 0 refills | Status: DC

## 2024-07-25 NOTE — Telephone Encounter (Signed)
 Requested Prescriptions  Pending Prescriptions Disp Refills   clopidogrel  (PLAVIX ) 75 MG tablet 30 tablet 0    Sig: Take 1 tablet (75 mg total) by mouth daily.     Hematology: Antiplatelets - clopidogrel  Failed - 07/25/2024  3:49 PM      Failed - HCT in normal range and within 180 days    HCT  Date Value Ref Range Status  04/06/2024 26.1 (L) 39.0 - 52.0 % Final   Hematocrit  Date Value Ref Range Status  10/06/2023 40.7 37.5 - 51.0 % Final         Failed - HGB in normal range and within 180 days    Hemoglobin  Date Value Ref Range Status  04/06/2024 8.1 (L) 13.0 - 17.0 g/dL Final  96/86/7974 86.2 13.0 - 17.7 g/dL Final         Passed - PLT in normal range and within 180 days    Platelets  Date Value Ref Range Status  04/06/2024 256 150 - 400 K/uL Final  10/06/2023 231 150 - 450 x10E3/uL Final         Passed - Cr in normal range and within 360 days    Creatinine, Ser  Date Value Ref Range Status  04/06/2024 0.67 0.61 - 1.24 mg/dL Final         Passed - Valid encounter within last 6 months    Recent Outpatient Visits           4 weeks ago Hx of long term use of blood thinners   [facility] [place] [facility], Amy J, NP   3 months ago History of partial amputation of toe of left foot   [place] [facility], MD   4 months ago Amputation of left great toe   [facility] Vicci Barnie NOVAK, MD   8 months ago Left foot pain   [place] [facility], MD   9 months ago Annual physical exam   [facility], MD

## 2024-07-27 NOTE — Telephone Encounter (Signed)
"  Medication refill  "

## 2024-08-01 ENCOUNTER — Encounter: Payer: Self-pay | Admitting: General Surgery

## 2024-08-01 ENCOUNTER — Other Ambulatory Visit: Payer: Self-pay

## 2024-08-01 DIAGNOSIS — I739 Peripheral vascular disease, unspecified: Secondary | ICD-10-CM

## 2024-08-02 ENCOUNTER — Encounter: Payer: Self-pay | Admitting: Vascular Surgery

## 2024-08-07 NOTE — Telephone Encounter (Signed)
 Refill request

## 2024-08-16 ENCOUNTER — Ambulatory Visit: Admitting: Family Medicine

## 2024-08-16 ENCOUNTER — Ambulatory Visit (INDEPENDENT_AMBULATORY_CARE_PROVIDER_SITE_OTHER): Admitting: Family Medicine

## 2024-08-16 ENCOUNTER — Encounter: Payer: Self-pay | Admitting: Family Medicine

## 2024-08-16 VITALS — BP 158/94 | HR 81 | Ht 74.0 in | Wt 199.0 lb

## 2024-08-16 DIAGNOSIS — F17200 Nicotine dependence, unspecified, uncomplicated: Secondary | ICD-10-CM | POA: Diagnosis not present

## 2024-08-16 DIAGNOSIS — I10 Essential (primary) hypertension: Secondary | ICD-10-CM | POA: Diagnosis not present

## 2024-08-16 DIAGNOSIS — Z89422 Acquired absence of other left toe(s): Secondary | ICD-10-CM

## 2024-08-16 DIAGNOSIS — Z7901 Long term (current) use of anticoagulants: Secondary | ICD-10-CM

## 2024-08-16 MED ORDER — LISINOPRIL 20 MG PO TABS: 20.0000 mg | ORAL_TABLET | Freq: Every day | ORAL | 1 refills | Status: AC

## 2024-08-16 MED ORDER — CLOPIDOGREL BISULFATE 75 MG PO TABS: 75.0000 mg | ORAL_TABLET | Freq: Every day | ORAL | 1 refills | Status: AC

## 2024-08-16 MED ORDER — ATORVASTATIN CALCIUM 80 MG PO TABS: 80.0000 mg | ORAL_TABLET | Freq: Every day | ORAL | 1 refills | Status: AC

## 2024-08-16 NOTE — Progress Notes (Signed)
 "  Established Patient Office Visit  Subjective    Patient ID: Todd [name], male    DOB: 1966-01-28  Age: 59 y.o. MRN: [id number]  CC:  Chief Complaint  Patient presents with   Medical Management of Chronic Issues    HPI Todd [name] presents for routine follow up of hypertension. Patient reports that he has been having pain from the surgery. He is scheduled for a follow up with them in the upcoming weeks.   Outpatient Encounter Medications as of 08/16/2024  Medication Sig   aspirin  EC 81 MG tablet Take 1 tablet (81 mg total) by mouth daily. Swallow whole.   [DISCONTINUED] atorvastatin  (LIPITOR) 80 MG tablet Take 1 tablet (80 mg total) by mouth daily.   [DISCONTINUED] clopidogrel  (PLAVIX ) 75 MG tablet Take 1 tablet (75 mg total) by mouth daily.   [DISCONTINUED] lisinopril  (ZESTRIL ) 20 MG tablet TAKE 1 TABLET BY MOUTH EVERY DAY   atorvastatin  (LIPITOR) 80 MG tablet Take 1 tablet (80 mg total) by mouth daily.   cephALEXin  (KEFLEX ) 500 MG capsule Take 1 capsule (500 mg total) by mouth 3 (three) times daily. (Patient not taking: Reported on 08/16/2024)   clopidogrel  (PLAVIX ) 75 MG tablet Take 1 tablet (75 mg total) by mouth daily.   lisinopril  (ZESTRIL ) 20 MG tablet Take 1 tablet (20 mg total) by mouth daily.   oxyCODONE -acetaminophen  (PERCOCET/ROXICET) 5-325 MG tablet Take 1 tablet by mouth every 6 (six) hours as needed for severe pain (pain score 7-10). (Patient not taking: Reported on 08/16/2024)   [DISCONTINUED] gabapentin  (NEURONTIN ) 300 MG capsule Take 2 capsules (600 mg total) by mouth 3 (three) times daily.   [DISCONTINUED] nicotine  (NICODERM CQ  - DOSED IN MG/24 HOURS) 21 mg/24hr patch PLACE 1 PATCH ONTO THE SKIN DAILY.   No facility-administered encounter medications on file as of 08/16/2024.    Past Medical History:  Diagnosis Date   Hyperlipidemia    Hypertension    Stroke [facility])     Past Surgical History:  Procedure Laterality Date   ABDOMINAL AORTIC ENDOVASCULAR STENT  GRAFT N/A 03/06/2024   Procedure: INSERTION, ENDOVASCULAR STENT GRAFT, AORTA, ABDOMINAL;LEFT RENAL ARTERY STENT;  Surgeon: Sheree Penne Bruckner, MD;  Location: [facility] OR;  Service: Vascular;  Laterality: N/A;   ABDOMINAL AORTOGRAM W/LOWER EXTREMITY N/A 03/05/2024   Procedure: ABDOMINAL AORTOGRAM W/LOWER EXTREMITY;  Surgeon: Sheree Penne Bruckner, MD;  Location: [facility] INVASIVE CV LAB;  Service: Cardiovascular;  Laterality: N/A;   AMPUTATION Left 03/06/2024   Procedure: AMPUTATION, LEFT GREAT TOE;  Surgeon: Sheree Penne Bruckner, MD;  Location: [facility] OR;  Service: Vascular;  Laterality: Left;   AMPUTATION Left 03/23/2024   Procedure: LEFT GREAT TOE, TRANSMETATARSAL AMPUTATION;  Surgeon: Sheree Penne Bruckner, MD;  Location: [facility] OR;  Service: Vascular;  Laterality: Left;  LEFT TOE AMPUTATION   APPLICATION OF WOUND VAC Left 03/23/2024   Procedure: APPLICATION, WOUND VAC;  Surgeon: Sheree Penne Bruckner, MD;  Location: [facility] OR;  Service: Vascular;  Laterality: Left;   APPLICATION OF WOUND VAC Left 04/05/2024   Procedure: APPLICATION, WOUND VAC;  Surgeon: [name] [name], MD;  Location: [facility] OR;  Service: Vascular;  Laterality: Left;   APPLICATION, SKIN SUBSTITUTE  04/05/2024   Procedure: APPLICATION, SKIN SUBSTITUTE;  Surgeon: [name] [name], MD;  Location: [facility] OR;  Service: Vascular;;   ENDARTERECTOMY FEMORAL Left 03/06/2024   Procedure: ENDARTERECTOMY, LEFT FEMORAL, LEFT SUPERFICIAL FEMORAL ARTERY;  Surgeon: Sheree Penne Bruckner, MD;  Location: [facility] OR;  Service: Vascular;  Laterality: Left;   HERNIA  REPAIR     INCISION AND DRAINAGE OF WOUND Left 04/05/2024   Procedure: EXCISIONAL DEBRIDEMENT OF LEFT FOOT, BONE DEBRIDEMENT OF LEFT FIRST METATARSAL;  Surgeon: [name] [name], MD;  Location: [facility] OR;  Service: Vascular;  Laterality: Left;   IRRIGATION AND DEBRIDEMENT FOOT Left 03/23/2024   Procedure: IRRIGATION AND DEBRIDEMENT OF LEFT GREAT TOE AMPUTATION;  Surgeon: Sheree Penne Bruckner, MD;   Location: [facility] OR;  Service: Vascular;  Laterality: Left;   LOWER EXTREMITY INTERVENTION Left 03/05/2024   Procedure: LOWER EXTREMITY INTERVENTION;  Surgeon: Sheree Penne Bruckner, MD;  Location: [facility] INVASIVE CV LAB;  Service: Cardiovascular;  Laterality: Left;   PATCH ANGIOPLASTY Left 03/06/2024   Procedure: ANGIOPLASTY, USING 1cm x 14cm XENOSURE PATCH GRAFT;  Surgeon: Sheree Penne Bruckner, MD;  Location: [facility] OR;  Service: Vascular;  Laterality: Left;   SHOULDER SURGERY Right    TRANSCAROTID ARTERY REVASCULARIZATION  Left 03/02/2024   Procedure: LEFT TRANSCAROTID ARTERY REVASCULARIZATION USING 8 X 40 MM ENROUTE TRANSCAROTID STENT;  Surgeon: Sheree Penne Bruckner, MD;  Location: [facility] OR;  Service: Vascular;  Laterality: Left;   ULTRASOUND GUIDANCE FOR VASCULAR ACCESS Bilateral 03/06/2024   Procedure: ULTRASOUND GUIDANCE, FOR VASCULAR ACCESS;  Surgeon: Sheree Penne Bruckner, MD;  Location: [facility] OR;  Service: Vascular;  Laterality: Bilateral;    Family History  Problem Relation Age of Onset   Cancer Mother    Cancer Father    Colon cancer Neg Hx    Colon polyps Neg Hx    Crohn's disease Neg Hx    Esophageal cancer Neg Hx    Rectal cancer Neg Hx    Stomach cancer Neg Hx    Ulcerative colitis Neg Hx     Social History   Socioeconomic History   Marital status: Divorced    Spouse name: Not on file   Number of children: Not on file   Years of education: Not on file   Highest education level: GED or equivalent  Occupational History   Not on file  Tobacco Use   Smoking status: Former    Current packs/day: 0.33    Average packs/day: 0.3 packs/day for 1.1 years (0.3 ttl pk-yrs)    Types: Cigarettes    Start date: 2025   Smokeless tobacco: Never  Vaping Use   Vaping status: Never Used  Substance and Sexual Activity   Alcohol use: Not Currently    Comment: beer after work   Drug use: No   Sexual activity: Yes  Other Topics Concern   Not on file  Social History Narrative    Not on file   Social Drivers of Health   Tobacco Use: Medium Risk (08/16/2024)   Patient History    Smoking Tobacco Use: Former    Smokeless Tobacco Use: Never    Passive Exposure: Not on file  Financial Resource Strain: Low Risk (06/25/2024)   Overall Financial Resource Strain (CARDIA)    Difficulty of Paying Living Expenses: Not very hard  Food Insecurity: No Food Insecurity (06/25/2024)   Epic    Worried About Radiation Protection Practitioner of Food in the Last Year: Never true    Ran Out of Food in the Last Year: Never true  Transportation Needs: No Transportation Needs (06/25/2024)   Epic    Lack of Transportation (Medical): No    Lack of Transportation (Non-Medical): No  Physical Activity: Inactive (06/25/2024)   Exercise Vital Sign    Days of Exercise per Week: 0 days    Minutes of Exercise per Session: Not  on file  Stress: No Stress Concern Present (06/25/2024)   [employer] of Occupational Health - Occupational Stress Questionnaire    Feeling of Stress: Not at all  Social Connections: Moderately Isolated (06/25/2024)   Social Connection and Isolation Panel    Frequency of Communication with Friends and Family: More than three times a week    Frequency of Social Gatherings with Friends and Family: Once a week    Attends Religious Services: Patient declined    Active Member of Clubs or Organizations: No    Attends Engineer, Structural: Not on file    Marital Status: Married  Intimate Partner Violence: Not At Risk (04/05/2024)   Epic    Fear of Current or Ex-Partner: No    Emotionally Abused: No    Physically Abused: No    Sexually Abused: No  Depression (PHQ2-9): Low Risk (06/26/2024)   Depression (PHQ2-9)    PHQ-2 Score: 0  Alcohol Screen: Low Risk (10/05/2022)   Alcohol Screen    Last Alcohol Screening Score (AUDIT): 2  Housing: Unknown (06/25/2024)   Epic    Unable to Pay for Housing in the Last Year: No    Number of Times Moved in the Last Year: Not on file    Homeless  in the Last Year: No  Utilities: Not At Risk (04/05/2024)   Epic    Threatened with loss of utilities: No  Health Literacy: Adequate Health Literacy (04/07/2023)   B1300 Health Literacy    Frequency of need for help with medical instructions: Never    Review of Systems  All other systems reviewed and are negative.       Objective    BP (!) 158/94   Pulse 81   Ht 6' 2 (1.88 m)   Wt 199 lb (90.3 kg)   SpO2 100%   BMI 25.55 kg/m   Physical Exam Vitals and nursing note reviewed.  Constitutional:      General: He is not in acute distress. Cardiovascular:     Rate and Rhythm: Normal rate and regular rhythm.  Pulmonary:     Effort: Pulmonary effort is normal.     Breath sounds: Normal breath sounds.  Abdominal:     Palpations: Abdomen is soft.     Tenderness: There is no abdominal tenderness.  Musculoskeletal:     Comments: Left foot dressing dry  Neurological:     General: No focal deficit present.     Mental Status: He is alert and oriented to person, place, and time.         Assessment & Plan:   1. Essential hypertension (Primary) Elevated readings. Patient reports that he has more acceptable readings at home. Continue  2. Hx of long term use of blood thinners  - clopidogrel  (PLAVIX ) 75 MG tablet; Take 1 tablet (75 mg total) by mouth daily.  Dispense: 90 tablet; Refill: 1  3. 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 2/2 pain will refer to pain management.  - Ambulatory referral to Pain Clinic  4. Smoker Discussed reduction/cessation    No follow-ups on file.   Tanda Raguel SQUIBB, MD  "

## 2024-09-19 ENCOUNTER — Ambulatory Visit (HOSPITAL_COMMUNITY)

## 2024-09-19 ENCOUNTER — Ambulatory Visit

## 2024-11-14 ENCOUNTER — Ambulatory Visit: Payer: Self-pay | Admitting: Family Medicine

## 2025-02-12 ENCOUNTER — Ambulatory Visit: Admitting: Adult Health
